# Patient Record
Sex: Male | Born: 1975 | Race: White | Hispanic: No | Smoking: Former smoker
Health system: Southern US, Community
[De-identification: ages and names within clinical notes are randomized; demographics above are authoritative.]

## PROBLEM LIST (undated history)

## (undated) DIAGNOSIS — D649 Anemia, unspecified: Secondary | ICD-10-CM

## (undated) DIAGNOSIS — R011 Cardiac murmur, unspecified: Secondary | ICD-10-CM

## (undated) DIAGNOSIS — G4730 Sleep apnea, unspecified: Secondary | ICD-10-CM

## (undated) DIAGNOSIS — I509 Heart failure, unspecified: Secondary | ICD-10-CM

## (undated) DIAGNOSIS — J449 Chronic obstructive pulmonary disease, unspecified: Secondary | ICD-10-CM

## (undated) DIAGNOSIS — T7840XA Allergy, unspecified, initial encounter: Secondary | ICD-10-CM

## (undated) DIAGNOSIS — F1910 Other psychoactive substance abuse, uncomplicated: Secondary | ICD-10-CM

## (undated) DIAGNOSIS — K219 Gastro-esophageal reflux disease without esophagitis: Secondary | ICD-10-CM

## (undated) DIAGNOSIS — J45909 Unspecified asthma, uncomplicated: Secondary | ICD-10-CM

## (undated) DIAGNOSIS — F32A Depression, unspecified: Secondary | ICD-10-CM

## (undated) HISTORY — DX: Sleep apnea, unspecified: G47.30

## (undated) HISTORY — DX: Heart failure, unspecified: I50.9

## (undated) HISTORY — DX: Allergy, unspecified, initial encounter: T78.40XA

## (undated) HISTORY — DX: Other psychoactive substance abuse, uncomplicated: F19.10

## (undated) HISTORY — DX: Depression, unspecified: F32.A

## (undated) HISTORY — DX: Cardiac murmur, unspecified: R01.1

## (undated) HISTORY — DX: Chronic obstructive pulmonary disease, unspecified: J44.9

## (undated) HISTORY — PX: CARDIAC CATHETERIZATION: SHX172

## (undated) HISTORY — PX: NO PAST SURGERIES: SHX2092

## (undated) HISTORY — DX: Gastro-esophageal reflux disease without esophagitis: K21.9

## (undated) HISTORY — DX: Unspecified asthma, uncomplicated: J45.909

## (undated) HISTORY — DX: Anemia, unspecified: D64.9

---

## 2020-05-28 DIAGNOSIS — U071 COVID-19: Secondary | ICD-10-CM

## 2020-05-28 DIAGNOSIS — R112 Nausea with vomiting, unspecified: Secondary | ICD-10-CM

## 2020-05-28 DIAGNOSIS — R079 Chest pain, unspecified: Secondary | ICD-10-CM

## 2020-05-28 DIAGNOSIS — I5023 Acute on chronic systolic (congestive) heart failure: Secondary | ICD-10-CM

## 2020-05-28 DIAGNOSIS — J449 Chronic obstructive pulmonary disease, unspecified: Secondary | ICD-10-CM | POA: Insufficient documentation

## 2020-05-28 DIAGNOSIS — I447 Left bundle-branch block, unspecified: Secondary | ICD-10-CM

## 2020-05-28 DIAGNOSIS — J1282 Pneumonia due to coronavirus disease 2019: Secondary | ICD-10-CM | POA: Insufficient documentation

## 2020-05-28 DIAGNOSIS — F411 Generalized anxiety disorder: Secondary | ICD-10-CM | POA: Insufficient documentation

## 2020-05-28 DIAGNOSIS — I10 Essential (primary) hypertension: Secondary | ICD-10-CM | POA: Insufficient documentation

## 2020-05-28 HISTORY — DX: Acute on chronic systolic (congestive) heart failure: I50.23

## 2020-05-28 HISTORY — DX: Chest pain, unspecified: R07.9

## 2020-05-28 HISTORY — DX: Essential (primary) hypertension: I10

## 2020-05-28 HISTORY — DX: Pneumonia due to coronavirus disease 2019: J12.82

## 2020-05-28 HISTORY — DX: Left bundle-branch block, unspecified: I44.7

## 2020-05-28 HISTORY — DX: Generalized anxiety disorder: F41.1

## 2020-05-28 HISTORY — DX: COVID-19: U07.1

## 2020-05-28 HISTORY — DX: Nausea with vomiting, unspecified: R11.2

## 2020-07-21 ENCOUNTER — Other Ambulatory Visit: Payer: Self-pay

## 2020-07-21 ENCOUNTER — Encounter: Payer: Self-pay | Admitting: Emergency Medicine

## 2020-07-21 ENCOUNTER — Encounter: Payer: Self-pay | Admitting: Cardiology

## 2020-07-21 ENCOUNTER — Ambulatory Visit (INDEPENDENT_AMBULATORY_CARE_PROVIDER_SITE_OTHER): Payer: Self-pay | Admitting: Cardiology

## 2020-07-21 DIAGNOSIS — I509 Heart failure, unspecified: Secondary | ICD-10-CM | POA: Insufficient documentation

## 2020-07-21 DIAGNOSIS — I5042 Chronic combined systolic (congestive) and diastolic (congestive) heart failure: Secondary | ICD-10-CM

## 2020-07-21 DIAGNOSIS — I420 Dilated cardiomyopathy: Secondary | ICD-10-CM | POA: Insufficient documentation

## 2020-07-21 DIAGNOSIS — R0600 Dyspnea, unspecified: Secondary | ICD-10-CM

## 2020-07-21 DIAGNOSIS — G4733 Obstructive sleep apnea (adult) (pediatric): Secondary | ICD-10-CM

## 2020-07-21 DIAGNOSIS — R0609 Other forms of dyspnea: Secondary | ICD-10-CM | POA: Insufficient documentation

## 2020-07-21 HISTORY — DX: Other forms of dyspnea: R06.09

## 2020-07-21 HISTORY — DX: Dyspnea, unspecified: R06.00

## 2020-07-21 HISTORY — DX: Heart failure, unspecified: I50.9

## 2020-07-21 HISTORY — DX: Dilated cardiomyopathy: I42.0

## 2020-07-21 HISTORY — DX: Obstructive sleep apnea (adult) (pediatric): G47.33

## 2020-07-21 NOTE — Patient Instructions (Addendum)
Medication Instructions:  Your physician recommends that you continue on your current medications as directed. Please refer to the Current Medication list given to you today.   *If you need a refill on your cardiac medications before your next appointment, please call your pharmacy*   Lab Work: You will have labs at [facility] clinic   If you have labs (blood work) drawn today and your tests are completely normal, you will receive your results only by: Marland Kitchen MyChart Message (if you have MyChart) OR . A paper copy in the mail If you have any lab test that is abnormal or we need to change your treatment, we will call you to review the results.   Testing/Procedures: None   Follow-Up: At [facility], you and your health needs are our priority.  As part of our continuing mission to provide you with exceptional heart care, we have created designated Provider Care Teams.  These Care Teams include your primary Cardiologist (physician) and Advanced Practice Providers (APPs -  Physician Assistants and Nurse Practitioners) who all work together to provide you with the care you need, when you need it.  We recommend signing up for the patient portal called "MyChart".  Sign up information is provided on this After Visit Summary.  MyChart is used to connect with patients for Virtual Visits (Telemedicine).  Patients are able to view lab/test results, encounter notes, upcoming appointments, etc.  Non-urgent messages can be sent to your provider as well.   To learn more about what you can do with MyChart, go to [email or web address].    Your next appointment:   2 week(s)  The format for your next appointment:   In Person  Provider:   Gypsy Balsam, MD   Other Instructions  [email or web address]

## 2020-07-21 NOTE — Progress Notes (Signed)
Cardiology Consultation:    Date:  07/21/2020   ID:  Cameron Jenkins, DOB 01/19/1976, MRN [id number]  PCP:  [facility]  Cardiologist:  Gypsy Balsam, MD   Referring MD: [facility], [facility]   Chief Complaint  Patient presents with  . Congestive Heart Failure  I need to be established with cardiologist  History of Present Illness:    Cameron Jenkins is a 44 y.o. male who is being seen today for the evaluation of congestive heart failure at the request of [facility].  Recently he relocated to [place].  In February he was admitted in the hospital in [place] because of congestive heart failure.  He was found to have ejection fraction 10%.  I do not have records from that admission.  He was treated appropriately there was some conversation about LVAD and eventually he left hospital with [facility].  However he also lost his insurance and that he fell off from following up.  About a month ago he ended up going to [place] because of some shortness of breath he eventually end up signing out AGAINST MEDICAL ADVICE however he was find to have Covid.  He comes today to the office he would like to be establish in cardiac clinic.  He still described to have some shortness of breath fatigue tiredness as well as swelling of lower extremities denies having a palpitation, syncope.  Past Medical History:  Diagnosis Date  . COPD (chronic obstructive pulmonary disease) (HCC)   . Heart failure (HCC)   . 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 NO PAST SURGERIES      Current Medications: Current Meds  Medication Sig  . albuterol (PROVENTIL) (5 MG/ML) 0.5% nebulizer solution Take 2.5 mg by nebulization every 6 (six) hours as needed for wheezing or shortness of breath.  . budesonide-formoterol (SYMBICORT) 160-4.5 MCG/ACT inhaler Inhale 2 puffs into the lungs 2 (two) times daily.  . bumetanide  (BUMEX) 1 MG tablet Take 1 mg by mouth daily.  . carvedilol (COREG) 3.125 MG tablet Take 3.125 mg by mouth 2 (two) times daily with a meal. For 30 days  . Coenzyme Q10 (CO Q 10) 100 MG CAPS Take by mouth daily.  . valsartan (DIOVAN) 40 MG tablet Take 20 mg by mouth 2 (two) times daily.     Allergies:   Acetaminophen   Social History   Socioeconomic History  . Marital status: Unknown    Spouse name: Not on file  . Number of children: Not on file  . Years of education: Not on file  . Highest education level: Not on file  Occupational History  . Not on file  Tobacco Use  . Smoking status: Former Smoker    Types: Cigarettes    Quit date: 07/21/2013    Years since quitting: 7.0  . Smokeless tobacco: Never Used  Substance and Sexual Activity  . Alcohol use: Never  . Drug use: Not Currently    Types: Marijuana  . Sexual activity: Not on file  Other Topics Concern  . Not on file  Social History Narrative  . Not on file   Social Determinants of Health   Financial Resource Strain:   . Difficulty of Paying Living Expenses: Not on file  Food Insecurity:   . Worried About Programme researcher, broadcasting/film/video in the Last Year: Not on file  . Ran Out of Food in  the Last Year: Not on file  Transportation Needs:   . Lack of Transportation (Medical): Not on file  . Lack of Transportation (Non-Medical): Not on file  Physical Activity:   . Days of Exercise per Week: Not on file  . Minutes of Exercise per Session: Not on file  Stress:   . Feeling of Stress : Not on file  Social Connections:   . Frequency of Communication with Friends and Family: Not on file  . Frequency of Social Gatherings with Friends and Family: Not on file  . Attends Religious Services: Not on file  . Active Member of Clubs or Organizations: Not on file  . Attends Banker Meetings: Not on file  . Marital Status: Not on file     Family History: The patient's family history includes Bone cancer in his maternal  grandmother; COPD in his mother; Heart attack in his father; Heart disease in his mother; Stomach cancer in his maternal grandfather; Stroke in his father. ROS:   Please see the history of present illness.    All 14 point review of systems negative except as described per history of present illness.  EKGs/Labs/Other Studies Reviewed:    The following studies were reviewed today: I did have a quick look with ultrasound today which showed ejection fraction less than 20%  EKG:  EKG is  ordered today.  The ekg ordered today demonstrates sinus rhythm, normal P interval, left bundle branch block  Recent Labs: No results found for requested labs within last 8760 hours.  Recent Lipid Panel No results found for: CHOL, TRIG, HDL, CHOLHDL, VLDL, LDLCALC, LDLDIRECT  Physical Exam:    VS:  BP 110/84 (BP Location: Right Arm, Patient Position: Sitting, Cuff Size: Normal)   Pulse 82   Ht 6\' 1"  (1.854 m)   Wt 232 lb (105.2 kg)   SpO2 96%   BMI 30.61 kg/m     Wt Readings from Last 3 Encounters:  07/21/20 232 lb (105.2 kg)  06/22/20 230 lb (104.3 kg)     GEN:  Well nourished, well developed in no acute distress HEENT: Normal NECK: No JVD; No carotid bruits LYMPHATICS: No lymphadenopathy CARDIAC: RRR, no murmurs, no rubs, no gallops RESPIRATORY:  Clear to auscultation without rales, wheezing or rhonchi  ABDOMEN: Soft, non-tender, non-distended MUSCULOSKELETAL:  No edema; No deformity  SKIN: Warm and dry NEUROLOGIC:  Alert and oriented x 3 PSYCHIATRIC:  Normal affect   ASSESSMENT:    1. Chronic combined systolic and diastolic congestive heart failure (HCC)   2. Dilated cardiomyopathy (HCC)   3. Dyspnea on exertion   4. Obstructive sleep apnea    PLAN:    In order of problems listed above:  1. Cardiomyopathy which appears to be dilated ejection fraction still less than 20 he is on Diovan Bumex Coreg, he hates needles I try to do Chem-7 on him today he refused.  However he did have  blood test done this week in the primary care physician office will call his office to get a copy of it.  I would like to increase dose of Diovan if he can also I would like to add Aldactone to his medical regimen and go up with beta-blocker.  Will request copy of his records from 08/22/20.  I would like to know if there was evaluation for coronary artery disease being done.  He said he had a stress test but we need to get a copy of it.  He need to be  to put on appropriate medical therapy and then if there is no improvement consideration should be given to BiV pacing with ICD. 2. Dyspnea on exertion related to congestive heart failure. 3. Congestive heart for which is systolic and diastolic he is New York Heart Association class III. 4. Obstructive sleep apnea.  He does not have any insurance he cannot afford CPAP mask.  And he does not want to use it.   Overall he is a very sick individual with severe cardiomyopathy.  We will try to help him the best we can.  Obstacle I see his lack of insurance.   Medication Adjustments/Labs and Tests Ordered: Current medicines are reviewed at length with the patient today.  Concerns regarding medicines are outlined above.  No orders of the defined types were placed in this encounter.  No orders of the defined types were placed in this encounter.   Signed, Georgeanna Lea, MD, [facility]. 07/21/2020 3:28 PM    [facility]

## 2020-08-03 DIAGNOSIS — I509 Heart failure, unspecified: Secondary | ICD-10-CM | POA: Insufficient documentation

## 2020-08-04 ENCOUNTER — Encounter: Payer: Self-pay | Admitting: Cardiology

## 2020-08-04 ENCOUNTER — Ambulatory Visit (INDEPENDENT_AMBULATORY_CARE_PROVIDER_SITE_OTHER): Payer: Self-pay | Admitting: Cardiology

## 2020-08-04 ENCOUNTER — Other Ambulatory Visit: Payer: Self-pay

## 2020-08-04 VITALS — BP 110/84 | HR 82 | Ht 73.0 in | Wt 232.0 lb

## 2020-08-04 DIAGNOSIS — R0600 Dyspnea, unspecified: Secondary | ICD-10-CM

## 2020-08-04 DIAGNOSIS — I5042 Chronic combined systolic (congestive) and diastolic (congestive) heart failure: Secondary | ICD-10-CM

## 2020-08-04 DIAGNOSIS — R0609 Other forms of dyspnea: Secondary | ICD-10-CM

## 2020-08-04 DIAGNOSIS — I10 Essential (primary) hypertension: Secondary | ICD-10-CM

## 2020-08-04 DIAGNOSIS — I420 Dilated cardiomyopathy: Secondary | ICD-10-CM

## 2020-08-04 DIAGNOSIS — I447 Left bundle-branch block, unspecified: Secondary | ICD-10-CM

## 2020-08-04 MED ORDER — CARVEDILOL 6.25 MG PO TABS: 6.2500 mg | ORAL_TABLET | Freq: Two times a day (BID) | ORAL | 3 refills | Status: DC

## 2020-08-04 MED ORDER — BUMETANIDE 1 MG PO TABS
1.0000 mg | ORAL_TABLET | Freq: Every day | ORAL | 3 refills | Status: DC
Start: 2020-08-04 — End: 2021-02-23

## 2020-08-04 MED ORDER — VALSARTAN 40 MG PO TABS
40.0000 mg | ORAL_TABLET | Freq: Two times a day (BID) | ORAL | 3 refills | Status: DC
Start: 2020-08-04 — End: 2021-10-10

## 2020-08-04 NOTE — Patient Instructions (Signed)
Medication Instructions:  Increase Carvedilol (Coreg) to 6.25 mg twice daily   *If you need a refill on your cardiac medications before your next appointment, please call your pharmacy*   Lab Work: None ordered   If you have labs (blood work) drawn today and your tests are completely normal, you will receive your results only by: Marland Kitchen MyChart Message (if you have MyChart) OR . A paper copy in the mail If you have any lab test that is abnormal or we need to change your treatment, we will call you to review the results.   Testing/Procedures: None ordered    Follow-Up: At [facility], you and your health needs are our priority.  As part of our continuing mission to provide you with exceptional heart care, we have created designated Provider Care Teams.  These Care Teams include your primary Cardiologist (physician) and Advanced Practice Providers (APPs -  Physician Assistants and Nurse Practitioners) who all work together to provide you with the care you need, when you need it.  We recommend signing up for the patient portal called "MyChart".  Sign up information is provided on this After Visit Summary.  MyChart is used to connect with patients for Virtual Visits (Telemedicine).  Patients are able to view lab/test results, encounter notes, upcoming appointments, etc.  Non-urgent messages can be sent to your provider as well.   To learn more about what you can do with MyChart, go to [email or web address].    Your next appointment:   3 week(s)  The format for your next appointment:   In Person  Provider:   Gypsy Balsam, MD   Other Instructions None

## 2020-08-04 NOTE — Progress Notes (Signed)
Cardiology Office Note:    Date:  08/04/2020   ID:  Cameron Jenkins, DOB 1975-12-23, MRN [id number]  PCP:  [facility]  Cardiologist:  Cameron Balsam, MD    Referring MD: [facility]   Chief Complaint  Patient presents with  . Follow-up  I am doing better  History of Present Illness:    Cameron Jenkins is a 44 y.o. male with past medical history significant for cardiomyopathy with severely reduced left ventricle ejection fraction 10 to 20%, initially he was admitted in [place] he spent long time in the [facility] there.  There was also some conversation about LVAD however he never got it that he lost his insurance relocated [place] and he went to [facility] clinic in our town and then he was referred to [place].  In the meantime he also ended up in emergency room in [place] because of difficulty breathing but signed AGAINST MEDICAL ADVICE.  When I did see him he was in decompensated congestive heart failure.  We restarted all medications, he did not allow [place] to do blood test on him.  We requested copy of his Chem-7 from [facility] clinic but so far did not get it. He comes today 2 months for follow-up.  He said he is doing better swelling of lower extremities still there but not much shortness of breath there is no proximal nocturnal dyspnea no dizziness no passing out.  He denies have any chest pain and again he said overall he feels better with medication that he is on  Past Medical History:  Diagnosis Date  . Acute on chronic systolic congestive heart failure (HCC) 05/28/2020   Formatting of this note might be different from the original. IMO update 2021  . Chest pain 05/28/2020  . Congestive heart failure (CHF) (HCC) 07/21/2020  . COPD (chronic obstructive pulmonary disease) (HCC)   . Dilated cardiomyopathy (HCC) 07/21/2020  . Dyspnea on exertion 07/21/2020  . Essential hypertension 05/28/2020  . Generalized anxiety disorder 05/28/2020  . Heart failure (HCC)   . LBBB  (left bundle branch block) 05/28/2020  . Nausea and vomiting 05/28/2020  . Obstructive sleep apnea 07/21/2020  . Pneumonia due to COVID-19 virus 05/28/2020   Formatting of this note might be different from the original. Diagnosed by SARS-CoV-2 PCR Cephid test at [facility] in [place], [place] on 05/28/2020.  . 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 NO PAST SURGERIES      Current Medications: Current Meds  Medication Sig  . albuterol (PROVENTIL) (5 MG/ML) 0.5% nebulizer solution Take 2.5 mg by nebulization every 6 (six) hours as needed for wheezing or shortness of breath.  . budesonide-formoterol (SYMBICORT) 160-4.5 MCG/ACT inhaler Inhale 2 puffs into the lungs 2 (two) times daily.  . bumetanide (BUMEX) 1 MG tablet Take 1 tablet (1 mg total) by mouth daily.  . carvedilol (COREG) 6.25 MG tablet Take 1 tablet (6.25 mg total) by mouth 2 (two) times daily with a meal.  . Coenzyme Q10 (CO Q 10) 100 MG CAPS Take by mouth daily.  . valsartan (DIOVAN) 40 MG tablet Take 1 tablet (40 mg total) by mouth 2 (two) times daily.  . [DISCONTINUED] bumetanide (BUMEX) 1 MG tablet Take 1 mg by mouth daily.  . [DISCONTINUED] carvedilol (COREG) 3.125 MG tablet Take 3.125 mg by mouth 2 (two) times daily with a meal. For 30 days  . [DISCONTINUED] valsartan (  DIOVAN) 40 MG tablet Take 20 mg by mouth 2 (two) times daily.     Allergies:   Acetaminophen   Social History   Socioeconomic History  . Marital status: Unknown    Spouse name: Not on file  . Number of children: Not on file  . Years of education: Not on file  . Highest education level: Not on file  Occupational History  . Not on file  Tobacco Use  . Smoking status: Former Smoker    Types: Cigarettes    Quit date: 07/21/2013    Years since quitting: 7.0  . Smokeless tobacco: Never Used  Substance and Sexual Activity  . Alcohol use: Never  . Drug use: Not Currently     Types: Marijuana  . Sexual activity: Not on file  Other Topics Concern  . Not on file  Social History Narrative  . Not on file   Social Determinants of Health   Financial Resource Strain:   . Difficulty of Paying Living Expenses: Not on file  Food Insecurity:   . Worried About Programme researcher, broadcasting/film/video in the Last Year: Not on file  . Ran Out of Food in the Last Year: Not on file  Transportation Needs:   . Lack of Transportation (Medical): Not on file  . Lack of Transportation (Non-Medical): Not on file  Physical Activity:   . Days of Exercise per Week: Not on file  . Minutes of Exercise per Session: Not on file  Stress:   . Feeling of Stress : Not on file  Social Connections:   . Frequency of Communication with Friends and Family: Not on file  . Frequency of Social Gatherings with Friends and Family: Not on file  . Attends Religious Services: Not on file  . Active Member of Clubs or Organizations: Not on file  . Attends Banker Meetings: Not on file  . Marital Status: Not on file     Family History: The patient's family history includes Bone cancer in his maternal grandmother; COPD in his mother; Heart attack in his father; Heart disease in his mother; Stomach cancer in his maternal grandfather; Stroke in his father. ROS:   Please see the history of present illness.    All 14 point review of systems negative except as described per history of present illness  EKGs/Labs/Other Studies Reviewed:      Recent Labs: No results found for requested labs within last 8760 hours.  Recent Lipid Panel No results found for: CHOL, TRIG, HDL, CHOLHDL, VLDL, LDLCALC, LDLDIRECT  Physical Exam:    VS:  BP 110/84 (BP Location: Right Arm, Patient Position: Sitting, Cuff Size: Normal)   Pulse 82   Ht 6\' 1"  (1.854 m)   Wt 232 lb (105.2 kg)   SpO2 96%   BMI 30.61 kg/m     Wt Readings from Last 3 Encounters:  08/04/20 232 lb (105.2 kg)  07/21/20 232 lb (105.2 kg)  06/22/20  230 lb (104.3 kg)     GEN:  Well nourished, well developed in no acute distress HEENT: Normal NECK: No JVD; No carotid bruits LYMPHATICS: No lymphadenopathy CARDIAC: RRR, no murmurs, no rubs, no gallops RESPIRATORY:  Clear to auscultation without rales, wheezing or rhonchi  ABDOMEN: Soft, non-tender, non-distended MUSCULOSKELETAL:  No edema; No deformity  SKIN: Warm and dry LOWER EXTREMITIES: no swelling NEUROLOGIC:  Alert and oriented x 3 PSYCHIATRIC:  Normal affect   ASSESSMENT:    1. Dilated cardiomyopathy (HCC)   2. Chronic  combined systolic and diastolic congestive heart failure (HCC)   3. Dyspnea on exertion   4. LBBB (left bundle branch block)   5. Essential hypertension    PLAN:    In order of problems listed above:  1. Dilated cardiomyopathy gradual return to putting on the right medications.  He is taking carvedilol 3.125 twice daily I will increase dose today to 6.25 twice a day.  I wanted him to have Chem-7 done to see if I can increase dose of valsartan or maybe even switch him to [facility], however, he is very scared of needles and he does not want to have it done.  He said just last Monday he had blood test check at [facility].  We will contact them on Monday and try to get a copy of it and based on that decide what to do next.  The key right now will be to put him on appropriate medical therapy and see if there is any improvement left ventricle ejection fraction.  If not then need to consider BiV pacing with defibrillator. 2. Dyspnea exertion improved with current medications.  On the physical exam only mild decompensation which is manifested by swelling of lower extremities. 3. Left bundle branch block, noted, he can benefit from BiV pacing.  However there are multiple obstacles including lack of insurance.  He apparently applied for Medicaid and he expect to get it and a day. 4. Essential hypertension is not a problem right now in control his blood pressure slightly  on the lower side.  Overall he is a very sick gentleman.  I still did not get records from [place] will request again.  See him back in 3 weeks   Medication Adjustments/Labs and Tests Ordered: Current medicines are reviewed at length with the patient today.  Concerns regarding medicines are outlined above.  No orders of the defined types were placed in this encounter.  Medication changes:  Meds ordered this encounter  Medications  . valsartan (DIOVAN) 40 MG tablet    Sig: Take 1 tablet (40 mg total) by mouth 2 (two) times daily.    Dispense:  180 tablet    Refill:  3  . bumetanide (BUMEX) 1 MG tablet    Sig: Take 1 tablet (1 mg total) by mouth daily.    Dispense:  90 tablet    Refill:  3  . carvedilol (COREG) 6.25 MG tablet    Sig: Take 1 tablet (6.25 mg total) by mouth 2 (two) times daily with a meal.    Dispense:  180 tablet    Refill:  3    Signed, Georgeanna Lea, MD, [facility] 08/04/2020 3:40 PM    [facility]

## 2020-08-25 ENCOUNTER — Ambulatory Visit: Payer: Self-pay | Admitting: Cardiology

## 2020-12-15 ENCOUNTER — Ambulatory Visit: Payer: Medicaid Other | Admitting: Cardiology

## 2021-02-23 ENCOUNTER — Ambulatory Visit: Payer: Medicaid Other | Admitting: Cardiology

## 2021-02-23 ENCOUNTER — Encounter: Payer: Self-pay | Admitting: Cardiology

## 2021-02-23 ENCOUNTER — Other Ambulatory Visit: Payer: Self-pay

## 2021-02-23 VITALS — BP 126/74 | HR 113 | Ht 73.0 in | Wt 244.0 lb

## 2021-02-23 DIAGNOSIS — I10 Essential (primary) hypertension: Secondary | ICD-10-CM | POA: Diagnosis not present

## 2021-02-23 DIAGNOSIS — I447 Left bundle-branch block, unspecified: Secondary | ICD-10-CM | POA: Diagnosis not present

## 2021-02-23 DIAGNOSIS — I420 Dilated cardiomyopathy: Secondary | ICD-10-CM

## 2021-02-23 DIAGNOSIS — F411 Generalized anxiety disorder: Secondary | ICD-10-CM

## 2021-02-23 MED ORDER — POTASSIUM CHLORIDE ER 10 MEQ PO TBCR: 10.0000 meq | EXTENDED_RELEASE_TABLET | Freq: Every day | ORAL | 1 refills | Status: DC

## 2021-02-23 MED ORDER — ALPRAZOLAM 0.25 MG PO TABS: ORAL_TABLET | ORAL | 0 refills | Status: DC

## 2021-02-23 MED ORDER — FUROSEMIDE 40 MG PO TABS: 40.0000 mg | ORAL_TABLET | Freq: Every day | ORAL | 1 refills | Status: DC

## 2021-02-23 NOTE — Patient Instructions (Signed)
Medication Instructions:  Your physician has recommended you make the following change in your medication: Stop Bumex, start Furosemide 40mg  once and day and Potassium CL 10 meq once daily. *If you need a refill on your cardiac medications before your next appointment, please call your pharmacy*   Lab Work: Your physician recommends that you return for lab work in: BMP. Please take xanax 0.25 1/2 tab 1 hour prior to your blood work appt.  If you have labs (blood work) drawn today and your tests are completely normal, you will receive your results only by: MyChart Message (if you have MyChart) OR A paper copy in the mail If you have any lab test that is abnormal or we need to change your treatment, we will call you to review the results.   Testing/Procedures:    Follow-Up: At [facility], you and your health needs are our priority.  As part of our continuing mission to provide you with exceptional heart care, we have created designated Provider Care Teams.  These Care Teams include your primary Cardiologist (physician) and Advanced Practice Providers (APPs -  Physician Assistants and Nurse Practitioners) who all work together to provide you with the care you need, when you need it.  We recommend signing up for the patient portal called "MyChart".  Sign up information is provided on this After Visit Summary.  MyChart is used to connect with patients for Virtual Visits (Telemedicine).  Patients are able to view lab/test results, encounter notes, upcoming appointments, etc.  Non-urgent messages can be sent to your provider as well.   To learn more about what you can do with MyChart, go to [facility].    Your next appointment:   3 month(s)  The format for your next appointment:   In Person  Provider:   [email or web address], MD   Other Instructions

## 2021-02-23 NOTE — Addendum Note (Signed)
Addended by: Heywood Bene on: 02/23/2021 11:26 AM   Modules accepted: Orders

## 2021-02-23 NOTE — Progress Notes (Signed)
Cardiology Office Note:    Date:  02/23/2021   ID:  Cameron Jenkins, DOB 23-Dec-1975, MRN [id number]  PCP:  [facility]  Cardiologist:  Gypsy Balsam, MD    Referring MD: [facility], [facility]   No chief complaint on file. Doing better  History of Present Illness:    Cameron Jenkins is a 45 y.o. male with quite incredible story he was diagnosed with severe cardiomyopathy ejection fraction 10 to 20% that was initially diagnosed while he was in [place].  He was admitted to the hospital and spent some time there there was some discussion about potentially putting LVAD into him however since he did not have insurance and did not happen eventually he ended up moving to [place] and up going to [place] because of difficulty breathing and then he signed out AGAINST MEDICAL ADVICE.  I have seen him so far twice twice in November we started putting on appropriate medication then he simply disappeared from follow-up he showed up today.  He said that the reason why he did not follow-up is the fact that he did not have a car.  Now he does have it he still described to have some shortness of breath but is no significant swelling of lower extremities there is no chest pain tightness squeezing pressure burning chest  Past Medical History:  Diagnosis Date   Acute on chronic systolic congestive heart failure (HCC) 05/28/2020   Formatting of this note might be different from the original. IMO update 2021   Chest pain 05/28/2020   Congestive heart failure (CHF) (HCC) 07/21/2020   COPD (chronic obstructive pulmonary disease) (HCC)    Dilated cardiomyopathy (HCC) 07/21/2020   Dyspnea on exertion 07/21/2020   Essential hypertension 05/28/2020   Generalized anxiety disorder 05/28/2020   Heart failure (HCC)    LBBB (left bundle branch block) 05/28/2020   Nausea and vomiting 05/28/2020   Obstructive sleep apnea 07/21/2020   Pneumonia due to COVID-19 virus 05/28/2020   Formatting of this note  might be different from the original. Diagnosed by SARS-CoV-2 PCR Cephid test at [facility] in [place], [place] on 05/28/2020.   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   budesonide-formoterol (SYMBICORT) 160-4.5 MCG/ACT inhaler Inhale 2 puffs into the lungs 2 (two) times daily.   bumetanide (BUMEX) 1 MG tablet Take 1 tablet (1 mg total) by mouth daily.   carvedilol (COREG) 6.25 MG tablet Take 1 tablet (6.25 mg total) by mouth 2 (two) times daily with a meal.   valsartan (DIOVAN) 40 MG tablet Take 1 tablet (40 mg total) by mouth 2 (two) times daily.     Allergies:   Acetaminophen   Social History   Socioeconomic History   Marital status: Unknown    Spouse name: Not on file   Number of children: Not on file   Years of education: Not on file   Highest education level: Not on file  Occupational History   Not on file  Tobacco Use   Smoking status: Former    Pack years: 0.00    Types: Cigarettes    Quit date: 07/21/2013    Years since quitting: 7.6  Smokeless tobacco: Never  Substance and Sexual Activity   Alcohol use: Never   Drug use: Not Currently    Types: Marijuana   Sexual activity: Not on file  Other Topics Concern   Not on file  Social History Narrative   Not on file   Social Determinants of Health   Financial Resource Strain: Not on file  Food Insecurity: Not on file  Transportation Needs: Not on file  Physical Activity: Not on file  Stress: Not on file  Social Connections: Not on file     Family History: The patient's family history includes Bone cancer in his maternal grandmother; COPD in his mother; Heart attack in his father; Heart disease in his  mother; Stomach cancer in his maternal grandfather; Stroke in his father. ROS:   Please see the history of present illness.    All 14 point review of systems negative except as described per history of present illness  EKGs/Labs/Other Studies Reviewed:      Recent Labs: No results found for requested labs within last 8760 hours.  Recent Lipid Panel No results found for: CHOL, TRIG, HDL, CHOLHDL, VLDL, LDLCALC, LDLDIRECT  Physical Exam:    VS:  BP 126/74 (BP Location: Left Arm, Patient Position: Sitting)   Pulse (!) 113   Ht 6\' 1"  (1.854 m)   Wt 244 lb (110.7 kg)   SpO2 94%   BMI 32.19 kg/m     Wt Readings from Last 3 Encounters:  02/23/21 244 lb (110.7 kg)  08/04/20 232 lb (105.2 kg)  07/21/20 232 lb (105.2 kg)     GEN:  Well nourished, well developed in no acute distress HEENT: Normal NECK: No JVD; No carotid bruits LYMPHATICS: No lymphadenopathy CARDIAC: RRR, no murmurs, no rubs, no gallops RESPIRATORY:  Clear to auscultation without rales, wheezing or rhonchi  ABDOMEN: Soft, non-tender, non-distended MUSCULOSKELETAL:  No edema; No deformity  SKIN: Warm and dry LOWER EXTREMITIES: no swelling NEUROLOGIC:  Alert and oriented x 3 PSYCHIATRIC:  Normal affect   ASSESSMENT:    1. Dilated cardiomyopathy (HCC)   2. LBBB (left bundle branch block)   3. Essential hypertension   4. Generalized anxiety disorder    PLAN:    In order of problems listed above:  Dilated cardiomyopathy I do know what his ejection fraction is right now.  He is a little overloaded with fluid I will give him furosemide 40 with potassium 10 we will check his Chem-7 next week.  He does have anxiety and he asked me to give him prescription for alprazolam before he comes to have his blood work which I will do.  He is taking carvedilol which I will continue also Diovan but again before making any changes in his management I need to know what his kidney function is.  Hemodynamically looks only minimally  decompensated. Essential hypertension, blood pressure appears to be well controlled we will continue present management. General anxiety disorder.  We will give him Xanax before visit for blood work. Noncompliance sadly the issue is his financial resources which are limited.  He did not have Cardon's why he did not come to follow-up visit.  Now he promised to follow-up on the regular basis. History of smoking he quit 7 years ago and he is still abstaining from smoking.   Medication Adjustments/Labs and Tests Ordered: Current medicines are reviewed at length with the patient today.  Concerns regarding medicines are outlined above.  No orders of the defined types were placed in this  encounter.  Medication changes: No orders of the defined types were placed in this encounter.   Signed, Georgeanna Lea, MD, [facility] 02/23/2021 11:11 AM    [facility]

## 2021-02-23 NOTE — Addendum Note (Signed)
Addended by: Heywood Bene on: 02/23/2021 11:53 AM   Modules accepted: Orders

## 2021-04-10 ENCOUNTER — Other Ambulatory Visit: Payer: Self-pay

## 2021-04-12 ENCOUNTER — Telehealth: Payer: Self-pay | Admitting: Cardiology

## 2021-04-12 ENCOUNTER — Ambulatory Visit: Payer: Medicaid Other | Admitting: Cardiology

## 2021-04-12 DIAGNOSIS — R0602 Shortness of breath: Secondary | ICD-10-CM

## 2021-04-12 NOTE — Telephone Encounter (Signed)
Pt c/o medication issue: 1. Name of Medication: furosemide  2. How are you currently taking this medication (dosage and times per day)? Once a day 3. Are you having a reaction (difficulty breathing--STAT)?  At night  4. What is your medication issue? Patient need to speak with some one concering this medication dosage

## 2021-04-12 NOTE — Addendum Note (Signed)
Addended by: Eleonore Chiquito on: 04/12/2021 01:01 PM   Modules accepted: Orders

## 2021-04-12 NOTE — Telephone Encounter (Signed)
Pt states that he has shortness of breath more in the mornings and late in the evening. Pt states that this has been going on for 2 weeks and he planned to discuss in the office but was unable to keep his appointment due to a fever. Pt was wondering if his fluid medication could be adjusted to morning and evening. How do you advise?

## 2021-04-12 NOTE — Telephone Encounter (Signed)
Recommendations reviewed with pt as per Dr. Vanetta Shawl note.  Pt states that he needs nerve medication prior to having labs done. Advised pt that we can lay him down but we do not prescribe medication for a blood draw. Pt also advised not to come in until after COVID test and fever free. Pt verbalized understanding and had no additional questions.

## 2021-06-08 DIAGNOSIS — F1190 Opioid use, unspecified, uncomplicated: Secondary | ICD-10-CM | POA: Insufficient documentation

## 2021-06-08 DIAGNOSIS — F1910 Other psychoactive substance abuse, uncomplicated: Secondary | ICD-10-CM | POA: Insufficient documentation

## 2021-06-08 DIAGNOSIS — Z5900 Homelessness unspecified: Secondary | ICD-10-CM | POA: Insufficient documentation

## 2021-06-09 DIAGNOSIS — F1120 Opioid dependence, uncomplicated: Secondary | ICD-10-CM | POA: Insufficient documentation

## 2021-08-02 ENCOUNTER — Ambulatory Visit: Payer: Medicaid Other | Admitting: Cardiology

## 2021-08-02 ENCOUNTER — Encounter: Payer: Self-pay | Admitting: Cardiology

## 2021-10-04 ENCOUNTER — Emergency Department (HOSPITAL_COMMUNITY): Payer: Medicaid Other

## 2021-10-04 ENCOUNTER — Other Ambulatory Visit: Payer: Self-pay

## 2021-10-04 ENCOUNTER — Encounter (HOSPITAL_COMMUNITY): Payer: Self-pay

## 2021-10-04 ENCOUNTER — Emergency Department (HOSPITAL_COMMUNITY)
Admission: EM | Admit: 2021-10-04 | Discharge: 2021-10-05 | Disposition: A | Discharge status: Home / Self Care | Payer: Medicaid Other | Source: Home / Self Care | Attending: Emergency Medicine | Admitting: Emergency Medicine

## 2021-10-04 DIAGNOSIS — Z20822 Contact with and (suspected) exposure to covid-19: Secondary | ICD-10-CM | POA: Insufficient documentation

## 2021-10-04 DIAGNOSIS — I5043 Acute on chronic combined systolic (congestive) and diastolic (congestive) heart failure: Secondary | ICD-10-CM | POA: Insufficient documentation

## 2021-10-04 DIAGNOSIS — Z79899 Other long term (current) drug therapy: Secondary | ICD-10-CM | POA: Insufficient documentation

## 2021-10-04 DIAGNOSIS — Z5321 Procedure and treatment not carried out due to patient leaving prior to being seen by health care provider: Secondary | ICD-10-CM | POA: Insufficient documentation

## 2021-10-04 DIAGNOSIS — I10 Essential (primary) hypertension: Secondary | ICD-10-CM | POA: Insufficient documentation

## 2021-10-04 DIAGNOSIS — I509 Heart failure, unspecified: Secondary | ICD-10-CM

## 2021-10-04 LAB — CBC
HCT: 49.8 % (ref 39.0–52.0)
Hemoglobin: 15.4 g/dL (ref 13.0–17.0)
MCH: 27.6 pg (ref 26.0–34.0)
MCHC: 30.9 g/dL (ref 30.0–36.0)
MCV: 89.2 fL (ref 80.0–100.0)
Platelets: 275 10*3/uL (ref 150–400)
RBC: 5.58 MIL/uL (ref 4.22–5.81)
RDW: 15.3 % (ref 11.5–15.5)
WBC: 6.9 10*3/uL (ref 4.0–10.5)
nRBC: 0 % (ref 0.0–0.2)

## 2021-10-04 LAB — RESP PANEL BY RT-PCR (FLU A&B, COVID) ARPGX2
Influenza A by PCR: NEGATIVE
Influenza B by PCR: NEGATIVE
SARS Coronavirus 2 by RT PCR: NEGATIVE

## 2021-10-04 LAB — BASIC METABOLIC PANEL
Anion gap: 5 (ref 5–15)
BUN: 15 mg/dL (ref 6–20)
CO2: 26 mmol/L (ref 22–32)
Calcium: 8.4 mg/dL — ABNORMAL LOW (ref 8.9–10.3)
Chloride: 106 mmol/L (ref 98–111)
Creatinine, Ser: 1.17 mg/dL (ref 0.61–1.24)
GFR, Estimated: >60 mL/min (ref >60)
Glucose, Bld: 121 mg/dL — ABNORMAL HIGH (ref 70–99)
Potassium: 4.6 mmol/L (ref 3.5–5.1)
Sodium: 137 mmol/L (ref 135–145)

## 2021-10-04 LAB — TROPONIN I (HIGH SENSITIVITY): Troponin I (High Sensitivity): 47 ng/L — ABNORMAL HIGH (ref <18)

## 2021-10-04 LAB — BRAIN NATRIURETIC PEPTIDE: B Natriuretic Peptide: 1326.2 pg/mL — ABNORMAL HIGH (ref 0.0–100.0)

## 2021-10-04 NOTE — ED Provider Triage Note (Signed)
Emergency Medicine Provider Triage Evaluation Note  Cameron Jenkins , a 46 y.o. male  was evaluated in triage.  Pt complains of chest pain and shortness of breath.  Was seen yesterday at the hospital, left before being seen.Marland Kitchen  History of CHF and hypertension, been out of his meds for 2 and half months.  70 pound weight gain.  Bilateral lower extremity swelling.  Review of Systems  Positive: Chest pain, shortness of breath, lower extremity swelling Negative: SYNCOPE  Physical Exam  BP (!) 149/104    Pulse (!) 109    Temp 97.7 F (36.5 C) (Oral)    Resp (!) 24    Ht 6\' 2"  (1.88 m)    Wt 118.4 kg    SpO2 98%    BMI 33.51 kg/m  Gen:   Awake, no distress   Resp:  Normal effort  MSK:   Moves extremities without difficulty  Other:  Pitting edema bilaterally, tachycardic and tachypneic.  Medical Decision Making  Medically screening exam initiated at 7:54 PM.  Appropriate orders placed.  was informed that the remainder of the evaluation will be completed by another provider, this initial triage assessment does not replace that evaluation, and the importance of remaining in the ED until their evaluation is complete.  LABS, DG CHEST. CHF exacerbation suspected. Trop yesterday 56.    [name], PA-C 10/04/21 1954

## 2021-10-04 NOTE — ED Triage Notes (Signed)
Pt presents to the ED, homeless with complaints of SOB, was seen at [facility] yesterday and after waiting multiple hours, left. Pt has hx of CHF and HTN, has been out of medication for 2.5 months. Productive cough with white sputum, congestion, swelling to bilateral lower legs.

## 2021-10-05 ENCOUNTER — Other Ambulatory Visit (HOSPITAL_COMMUNITY): Payer: Self-pay

## 2021-10-05 ENCOUNTER — Encounter (HOSPITAL_COMMUNITY): Payer: Self-pay

## 2021-10-05 ENCOUNTER — Observation Stay (HOSPITAL_COMMUNITY): Payer: Medicaid Other

## 2021-10-05 ENCOUNTER — Inpatient Hospital Stay (HOSPITAL_COMMUNITY)
Admission: EM | Admit: 2021-10-05 | Discharge: 2021-10-09 | DRG: 291 | Discharge status: Against Medical Advice | Payer: Medicaid Other | Attending: Internal Medicine | Admitting: Internal Medicine

## 2021-10-05 ENCOUNTER — Other Ambulatory Visit: Payer: Self-pay

## 2021-10-05 DIAGNOSIS — D751 Secondary polycythemia: Secondary | ICD-10-CM | POA: Diagnosis present

## 2021-10-05 DIAGNOSIS — G4733 Obstructive sleep apnea (adult) (pediatric): Secondary | ICD-10-CM | POA: Diagnosis present

## 2021-10-05 DIAGNOSIS — I10 Essential (primary) hypertension: Secondary | ICD-10-CM | POA: Diagnosis present

## 2021-10-05 DIAGNOSIS — Z7951 Long term (current) use of inhaled steroids: Secondary | ICD-10-CM | POA: Diagnosis not present

## 2021-10-05 DIAGNOSIS — Z5329 Procedure and treatment not carried out because of patient's decision for other reasons: Secondary | ICD-10-CM | POA: Diagnosis present

## 2021-10-05 DIAGNOSIS — I447 Left bundle-branch block, unspecified: Secondary | ICD-10-CM | POA: Diagnosis present

## 2021-10-05 DIAGNOSIS — I5023 Acute on chronic systolic (congestive) heart failure: Secondary | ICD-10-CM | POA: Diagnosis present

## 2021-10-05 DIAGNOSIS — F32A Depression, unspecified: Secondary | ICD-10-CM | POA: Diagnosis present

## 2021-10-05 DIAGNOSIS — Z5902 Unsheltered homelessness: Secondary | ICD-10-CM | POA: Diagnosis not present

## 2021-10-05 DIAGNOSIS — R112 Nausea with vomiting, unspecified: Secondary | ICD-10-CM | POA: Diagnosis not present

## 2021-10-05 DIAGNOSIS — F1910 Other psychoactive substance abuse, uncomplicated: Secondary | ICD-10-CM | POA: Diagnosis not present

## 2021-10-05 DIAGNOSIS — Z8674 Personal history of sudden cardiac arrest: Secondary | ICD-10-CM | POA: Diagnosis not present

## 2021-10-05 DIAGNOSIS — Z823 Family history of stroke: Secondary | ICD-10-CM

## 2021-10-05 DIAGNOSIS — I110 Hypertensive heart disease with heart failure: Principal | ICD-10-CM | POA: Diagnosis present

## 2021-10-05 DIAGNOSIS — E1165 Type 2 diabetes mellitus with hyperglycemia: Secondary | ICD-10-CM | POA: Diagnosis present

## 2021-10-05 DIAGNOSIS — Z5900 Homelessness unspecified: Secondary | ICD-10-CM

## 2021-10-05 DIAGNOSIS — Z79899 Other long term (current) drug therapy: Secondary | ICD-10-CM | POA: Diagnosis not present

## 2021-10-05 DIAGNOSIS — Z8249 Family history of ischemic heart disease and other diseases of the circulatory system: Secondary | ICD-10-CM

## 2021-10-05 DIAGNOSIS — F1510 Other stimulant abuse, uncomplicated: Secondary | ICD-10-CM | POA: Diagnosis present

## 2021-10-05 DIAGNOSIS — L03311 Cellulitis of abdominal wall: Secondary | ICD-10-CM | POA: Diagnosis present

## 2021-10-05 DIAGNOSIS — Z8616 Personal history of COVID-19: Secondary | ICD-10-CM | POA: Diagnosis not present

## 2021-10-05 DIAGNOSIS — J449 Chronic obstructive pulmonary disease, unspecified: Secondary | ICD-10-CM | POA: Diagnosis present

## 2021-10-05 DIAGNOSIS — Z66 Do not resuscitate: Secondary | ICD-10-CM | POA: Diagnosis present

## 2021-10-05 DIAGNOSIS — I509 Heart failure, unspecified: Secondary | ICD-10-CM

## 2021-10-05 DIAGNOSIS — E119 Type 2 diabetes mellitus without complications: Secondary | ICD-10-CM

## 2021-10-05 DIAGNOSIS — I420 Dilated cardiomyopathy: Secondary | ICD-10-CM | POA: Diagnosis present

## 2021-10-05 DIAGNOSIS — F411 Generalized anxiety disorder: Secondary | ICD-10-CM | POA: Diagnosis present

## 2021-10-05 DIAGNOSIS — F1190 Opioid use, unspecified, uncomplicated: Secondary | ICD-10-CM | POA: Diagnosis present

## 2021-10-05 DIAGNOSIS — Z800 Family history of malignant neoplasm of digestive organs: Secondary | ICD-10-CM | POA: Diagnosis not present

## 2021-10-05 DIAGNOSIS — Z87891 Personal history of nicotine dependence: Secondary | ICD-10-CM | POA: Diagnosis not present

## 2021-10-05 DIAGNOSIS — L0390 Cellulitis, unspecified: Secondary | ICD-10-CM | POA: Diagnosis present

## 2021-10-05 DIAGNOSIS — Z9114 Patient's other noncompliance with medication regimen: Secondary | ICD-10-CM | POA: Diagnosis not present

## 2021-10-05 DIAGNOSIS — I5022 Chronic systolic (congestive) heart failure: Secondary | ICD-10-CM | POA: Diagnosis not present

## 2021-10-05 DIAGNOSIS — I5043 Acute on chronic combined systolic (congestive) and diastolic (congestive) heart failure: Secondary | ICD-10-CM | POA: Diagnosis present

## 2021-10-05 DIAGNOSIS — F1110 Opioid abuse, uncomplicated: Secondary | ICD-10-CM | POA: Diagnosis present

## 2021-10-05 LAB — ECHOCARDIOGRAM COMPLETE
AR max vel: 3.35 cm2
AV Area VTI: 2.77 cm2
AV Area mean vel: 3.32 cm2
AV Mean grad: 1 mmHg
AV Peak grad: 1.7 mmHg
Ao pk vel: 0.65 m/s
Area-P 1/2: 6.12 cm2
Calc EF: 14.6 %
Height: 74 in
S' Lateral: 6.3 cm
Single Plane A2C EF: 19.9 %
Single Plane A4C EF: 11.7 %
Weight: 4162.28 oz

## 2021-10-05 LAB — RAPID URINE DRUG SCREEN, HOSP PERFORMED
Amphetamines: POSITIVE — AB
Barbiturates: NOT DETECTED
Benzodiazepines: NOT DETECTED
Cocaine: NOT DETECTED
Opiates: NOT DETECTED
Tetrahydrocannabinol: NOT DETECTED

## 2021-10-05 LAB — HIV ANTIBODY (ROUTINE TESTING W REFLEX): HIV Screen 4th Generation wRfx: NONREACTIVE

## 2021-10-05 LAB — TROPONIN I (HIGH SENSITIVITY): Troponin I (High Sensitivity): 52 ng/L — ABNORMAL HIGH (ref <18)

## 2021-10-05 MED ORDER — OXYCODONE HCL 5 MG PO TABS
5.0000 mg | ORAL_TABLET | Freq: Four times a day (QID) | ORAL | Status: DC | PRN
  Administered 2021-10-05 – 2021-10-08 (×4): 5 mg via ORAL
  Filled 2021-10-05 (×5): qty 1

## 2021-10-05 MED ORDER — ONDANSETRON HCL 4 MG/2ML IJ SOLN
4.0000 mg | Freq: Four times a day (QID) | INTRAMUSCULAR | Status: DC | PRN
  Administered 2021-10-08 – 2021-10-09 (×2): 4 mg via INTRAVENOUS
  Filled 2021-10-05 (×2): qty 2

## 2021-10-05 MED ORDER — KETOROLAC TROMETHAMINE 30 MG/ML IJ SOLN: 30.0000 mg | Freq: Four times a day (QID) | INTRAMUSCULAR | Status: DC | PRN

## 2021-10-05 MED ORDER — SODIUM CHLORIDE 0.9 % IV SOLN
2.0000 g | INTRAVENOUS | Status: DC
  Administered 2021-10-05 – 2021-10-09 (×5): 2 g via INTRAVENOUS
  Filled 2021-10-05 (×5): qty 20

## 2021-10-05 MED ORDER — IRBESARTAN 75 MG PO TABS
37.5000 mg | ORAL_TABLET | Freq: Every day | ORAL | Status: DC
  Administered 2021-10-05: 37.5 mg via ORAL
  Filled 2021-10-05: qty 0.5

## 2021-10-05 MED ORDER — SODIUM CHLORIDE 0.9 % IV SOLN: 250.0000 mL | INTRAVENOUS | Status: DC | PRN

## 2021-10-05 MED ORDER — ACETAMINOPHEN 325 MG PO TABS: 650.0000 mg | ORAL_TABLET | ORAL | Status: DC | PRN

## 2021-10-05 MED ORDER — ALBUTEROL SULFATE (2.5 MG/3ML) 0.083% IN NEBU
3.0000 mL | INHALATION_SOLUTION | Freq: Four times a day (QID) | RESPIRATORY_TRACT | Status: DC | PRN
  Administered 2021-10-05: 3 mL via RESPIRATORY_TRACT
  Filled 2021-10-05: qty 3

## 2021-10-05 MED ORDER — DIGOXIN 125 MCG PO TABS
0.1250 mg | ORAL_TABLET | Freq: Every day | ORAL | Status: DC
  Administered 2021-10-05 – 2021-10-09 (×5): 0.125 mg via ORAL
  Filled 2021-10-05 (×5): qty 1

## 2021-10-05 MED ORDER — SACUBITRIL-VALSARTAN 24-26 MG PO TABS
1.0000 | ORAL_TABLET | Freq: Two times a day (BID) | ORAL | Status: DC
  Administered 2021-10-05 – 2021-10-09 (×8): 1 via ORAL
  Filled 2021-10-05 (×8): qty 1

## 2021-10-05 MED ORDER — CARVEDILOL 3.125 MG PO TABS
3.1250 mg | ORAL_TABLET | Freq: Two times a day (BID) | ORAL | Status: DC
  Administered 2021-10-05: 3.125 mg via ORAL
  Filled 2021-10-05: qty 1

## 2021-10-05 MED ORDER — PERFLUTREN LIPID MICROSPHERE
1.0000 mL | INTRAVENOUS | Status: AC | PRN
  Administered 2021-10-05: 2 mL via INTRAVENOUS
  Filled 2021-10-05: qty 10

## 2021-10-05 MED ORDER — FUROSEMIDE 10 MG/ML IJ SOLN
40.0000 mg | INTRAMUSCULAR | Status: DC
  Filled 2021-10-05: qty 4

## 2021-10-05 MED ORDER — SPIRONOLACTONE 25 MG PO TABS
25.0000 mg | ORAL_TABLET | Freq: Every day | ORAL | Status: DC
  Administered 2021-10-05 – 2021-10-09 (×5): 25 mg via ORAL
  Filled 2021-10-05 (×5): qty 1

## 2021-10-05 MED ORDER — FUROSEMIDE 10 MG/ML IJ SOLN
40.0000 mg | Freq: Two times a day (BID) | INTRAMUSCULAR | Status: DC
  Administered 2021-10-05: 40 mg via INTRAVENOUS
  Filled 2021-10-05: qty 4

## 2021-10-05 MED ORDER — METHOCARBAMOL 500 MG PO TABS
500.0000 mg | ORAL_TABLET | Freq: Three times a day (TID) | ORAL | Status: DC | PRN
  Administered 2021-10-05 – 2021-10-08 (×4): 500 mg via ORAL
  Filled 2021-10-05 (×4): qty 1

## 2021-10-05 MED ORDER — FUROSEMIDE 10 MG/ML IJ SOLN: 40.0000 mg | Freq: Every day | INTRAMUSCULAR | Status: DC

## 2021-10-05 MED ORDER — SODIUM CHLORIDE 0.9% FLUSH
3.0000 mL | Freq: Two times a day (BID) | INTRAVENOUS | Status: DC
  Administered 2021-10-05 – 2021-10-09 (×9): 3 mL via INTRAVENOUS

## 2021-10-05 MED ORDER — ENOXAPARIN SODIUM 40 MG/0.4ML IJ SOSY
40.0000 mg | PREFILLED_SYRINGE | INTRAMUSCULAR | Status: DC
  Administered 2021-10-08: 40 mg via SUBCUTANEOUS
  Filled 2021-10-05 (×3): qty 0.4

## 2021-10-05 MED ORDER — FUROSEMIDE 10 MG/ML IJ SOLN
80.0000 mg | Freq: Two times a day (BID) | INTRAMUSCULAR | Status: DC
  Administered 2021-10-05 – 2021-10-07 (×5): 80 mg via INTRAVENOUS
  Filled 2021-10-05 (×6): qty 8

## 2021-10-05 MED ORDER — FUROSEMIDE 10 MG/ML IJ SOLN: 40.0000 mg | Freq: Two times a day (BID) | INTRAMUSCULAR | Status: DC

## 2021-10-05 MED ORDER — MOMETASONE FURO-FORMOTEROL FUM 200-5 MCG/ACT IN AERO
2.0000 | INHALATION_SPRAY | Freq: Two times a day (BID) | RESPIRATORY_TRACT | Status: DC
  Administered 2021-10-05 – 2021-10-09 (×8): 2 via RESPIRATORY_TRACT
  Filled 2021-10-05: qty 8.8

## 2021-10-05 MED ORDER — FUROSEMIDE 10 MG/ML IJ SOLN
40.0000 mg | INTRAMUSCULAR | Status: AC
  Administered 2021-10-05: 40 mg via INTRAVENOUS
  Filled 2021-10-05: qty 4

## 2021-10-05 MED ORDER — SODIUM CHLORIDE 0.9% FLUSH: 3.0000 mL | INTRAVENOUS | Status: DC | PRN

## 2021-10-05 NOTE — ED Notes (Signed)
Pt refused lovenox shot and stated its my body and I can do what I want and I dont want that. Will continue to monitor.

## 2021-10-05 NOTE — TOC Benefit Eligibility Note (Signed)
Patient Advocate Encounter  Prior Authorization for Entresto 24-26 mg has been approved.    PA# [id number] Effective dates: 10/05/2021 through 10/05/2022  Patients co-pay is $4.00.     Lyndel Safe, [place] Patient Advocate Specialist [place] Patient Advocate Team Direct Number: [phone]  Fax: [phone]

## 2021-10-05 NOTE — Consult Note (Addendum)
Advanced Heart Failure Team Consult Note   Primary Physician: Pcp, No PCP-Cardiologist:  None  Reason for Consultation: Acute on chronic systolic CHF  HPI:    Cameron Jenkins is seen today for evaluation of acute on chronic systolic CHF at the request of Dr. Jacinta Shoe with [facility].   46 y.o. male with history of chronic systolic CHF with EF [id number], LBBB, HTN, OSA, hx COPD, anxiety, prior tobacco use, hx noncompliance, hx polysubstance abuse. Diagnosed while admitted to a hospital in [place] with CHF. No records from admission available. There was mention of possible LVAD placement and was discharged with LifeVest. He lost insurance and was not seen for follow-up.  He established care with Dr. Agustin Cree in November 2021. Last seen in June 2022. Admitted to psych unit in September 2022 for detox from heroin. He had episode of syncope/? Possible asystole. Had very brief CPR. He was transferred to ICU at [facility]. He was seen by Cardiology. Echo with EF 15-20%. EP considered placement of CRT-D. Currently homeless and living in his vehicle. Defibrillator placement deferred until social situation more stable. Discharged home on carvedilol, spiro, valsartan and furosemide 40 daily. Did not follow-up with Cardiology after discharge.  Seen in ED at [facility] on 10/03/21 with chest tightness and dyspnea. Left before evaluated. Presented to [facility] ED yesterday evening with shortness of breath. Has been out of medications 2.5 months. Labs significant for BNP 1326, HS troponin 47>52, Scr 1.17. UDS positive for amphetamines. ECG with sinus tachycardia and known LBBB. Started 40 mg lasix IV daily then increased to 40 IV BID. Also started on IV rocephin for cellulitis of abdomen. Admitted to hospitalist service for management of a/c CHF and cellulitis.   Currently hypertensive with narrow pulse pressure. Sinus tachycardia noted. Diuresing well with IV lasix.  Echo today with EF < 20%, RV moderate to severely  reduced, mild MR, dilated IVC   Currently living in his car. Stays in [place] or [place]. Has an ex spouse and child in the area.   Reports much less frequent use of heroin and amphetamines since discharge but still using periodically when feels depressed. At one point had been sober for 15 years.   Significant family hx substance abuse.   Review of Systems: [y] = yes, [ ]  = no   General: Weight gain [Y]; Weight loss [ ] ; Anorexia [ ] ; Fatigue [ ] ; Fever [ ] ; Chills [ ] ; Weakness [ ]   Cardiac: Chest pain/pressure [ ] ; Resting SOB [Y]; Exertional SOB [Y]; Orthopnea [Y]; Pedal Edema [Y]; Palpitations [ ] ; Syncope [ ] ; Presyncope [ ] ; Paroxysmal nocturnal dyspnea[Y]  Pulmonary: Cough [ ] ; Wheezing[ ] ; Hemoptysis[ ] ; Sputum [ ] ; Snoring [ ]   GI: Vomiting[ ] ; Dysphagia[ ] ; Melena[ ] ; Hematochezia [ ] ; Heartburn[ ] ; Abdominal pain [ ] ; Constipation [ ] ; Diarrhea [ ] ; BRBPR [ ]   GU: Hematuria[ ] ; Dysuria [ ] ; Nocturia[ ]   Vascular: Pain in legs with walking [ ] ; Pain in feet with lying flat [ ] ; Non-healing sores [ ] ; Stroke [ ] ; TIA [ ] ; Slurred speech [ ] ;  Neuro: Headaches[ ] ; Vertigo[ ] ; Seizures[ ] ; Paresthesias[ ] ;Blurred vision [ ] ; Diplopia [ ] ; Vision changes [ ]   Ortho/Skin: Arthritis [ ] ; Joint pain [ ] ; Muscle pain [ ] ; Joint swelling [ ] ; Back Pain [ ] ; Rash [ ]   Psych: Depression[ ] ; Anxiety[ ]   Heme: Bleeding problems [ ] ; Clotting disorders [ ] ; Anemia [ ]   Endocrine: Diabetes [ ] ; Thyroid dysfunction[ ]   Home Medications Prior to Admission medications   Medication Sig Start Date End Date Taking? Authorizing Provider  albuterol (PROVENTIL) (5 MG/ML) 0.5% nebulizer solution Take 2.5 mg by nebulization every 6 (six) hours as needed for wheezing or shortness of breath.   Yes [provider]  budesonide-formoterol (SYMBICORT) 160-4.5 MCG/ACT inhaler Inhale 2 puffs into the lungs 2 (two) times daily.   Yes [provider]  carvedilol (COREG) 6.25 MG tablet Take 1  tablet (6.25 mg total) by mouth 2 (two) times daily with a meal. Patient taking differently: Take 3.125 mg by mouth 2 (two) times daily with a meal. 08/04/20  Yes Park Liter, MD  furosemide (LASIX) 40 MG tablet Take 1 tablet (40 mg total) by mouth daily. 02/23/21 10/05/21 Yes Park Liter, MD  potassium chloride (KLOR-CON) 10 MEQ tablet Take 1 tablet (10 mEq total) by mouth daily. 02/23/21 10/05/21 Yes Park Liter, MD  spironolactone (ALDACTONE) 25 MG tablet Take 25 mg by mouth daily.   Yes [provider]  valsartan (DIOVAN) 40 MG tablet Take 1 tablet (40 mg total) by mouth 2 (two) times daily. 08/04/20  Yes Park Liter, MD    Past Medical History: Past Medical History:  Diagnosis Date   Acute on chronic systolic congestive heart failure ([place]) 05/28/2020   Formatting of this note might be different from the original. IMO update 2021   Chest pain 05/28/2020   Congestive heart failure (CHF) ([place]) 07/21/2020   COPD (chronic obstructive pulmonary disease) ([place])    Dilated cardiomyopathy ([place]) 07/21/2020   Dyspnea on exertion 07/21/2020   Essential hypertension 05/28/2020   Generalized anxiety disorder 05/28/2020   Heart failure (HCC)    LBBB (left bundle branch block) 05/28/2020   Nausea and vomiting 05/28/2020   Obstructive sleep apnea 07/21/2020   Pneumonia due to COVID-19 virus 05/28/2020   Formatting of this note might be different from the original. Diagnosed by SARS-CoV-2 PCR Cephid test at [facility] in [place], [place] on 05/28/2020.   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     Past Surgical History: Past Surgical History:  Procedure Laterality Date   NO PAST SURGERIES      Family History: Family History  Problem Relation Age of Onset   COPD Mother    Heart disease Mother    Heart attack Father    Stroke Father    Bone cancer Maternal Grandmother    Stomach cancer Maternal Grandfather     Social History: Social  History   Socioeconomic History   Marital status: Unknown    Spouse name: Not on file   Number of children: Not on file   Years of education: Not on file   Highest education level: Not on file  Occupational History   Not on file  Tobacco Use   Smoking status: Former    Types: Cigarettes    Quit date: 07/21/2013    Years since quitting: 8.2   Smokeless tobacco: Never  Substance and Sexual Activity   Alcohol use: Never   Drug use: Not Currently    Types: Marijuana   Sexual activity: Not on file  Other Topics Concern   Not on file  Social History Narrative   Not on file   Social Determinants of Health   Financial Resource Strain: Not on file  Food Insecurity: Not on file  Transportation Needs: Not on file  Physical Activity: Not on file  Stress: Not on file  Social Connections: Not on file    Allergies:  Allergies  Allergen Reactions   Acetaminophen     Pt reports nausea    Objective:    Vital Signs:   Temp:  [97.6 F (36.4 C)-99.1 F (37.3 C)] 97.6 F (36.4 C) (01/20 0735) Pulse Rate:  [100-119] 100 (01/20 1015) Resp:  [10-31] 19 (01/20 1015) BP: (110-151)/(92-118) 131/100 (01/20 1015) SpO2:  [82 %-100 %] 100 % (01/20 1015) Weight:  [id number] kg] 118 kg (01/20 0223)    Weight change: Filed Weights   10/05/21 0223  Weight: 118 kg    Intake/Output:   Intake/Output Summary (Last 24 hours) at 10/05/2021 1138 Last data filed at 10/05/2021 1018 Gross per 24 hour  Intake 100 ml  Output 2875 ml  Net -2775 ml      Physical Exam    General:  No distress.  HEENT: normal Neck: supple. JVP to ear. Carotids 2+ bilat; no bruits. No lymphadenopathy or thyromegaly appreciated. Cor: PMI nondisplaced. Regular rate & rhythm, tachy. No rubs, gallops or murmurs. Lungs: clear Abdomen: soft, nontender, nondistended. No hepatosplenomegaly. No bruits or masses. Good bowel sounds. Extremities: no cyanosis, clubbing, rash, 3+ LE edema Neuro: alert & orientedx3, cranial  nerves grossly intact. moves all 4 extremities w/o difficulty. Affect pleasant   Telemetry   Sinus tach 100s  EKG    Sinus tach 100s, LBBB with QRS > 150 ms  Labs   Basic Metabolic Panel: Recent Labs  Lab 10/04/21 2004  NA 137  K 4.6  CL 106  CO2 26  GLUCOSE 121*  BUN 15  CREATININE 1.17  CALCIUM 8.4*    Liver Function Tests: No results for input(s): AST, ALT, ALKPHOS, BILITOT, PROT, ALBUMIN in the last 168 hours. No results for input(s): LIPASE, AMYLASE in the last 168 hours. No results for input(s): AMMONIA in the last 168 hours.  CBC: Recent Labs  Lab 10/04/21 2004  WBC 6.9  HGB 15.4  HCT 49.8  MCV 89.2  PLT 275    Cardiac Enzymes: No results for input(s): CKTOTAL, CKMB, CKMBINDEX, TROPONINI in the last 168 hours.  BNP: BNP (last 3 results) Recent Labs    10/04/21 2004  BNP 1,326.2*    ProBNP (last 3 results) No results for input(s): PROBNP in the last 8760 hours.   CBG: No results for input(s): GLUCAP in the last 168 hours.  Coagulation Studies: No results for input(s): LABPROT, INR in the last 72 hours.   Imaging   DG Chest 2 View  Result Date: 10/04/2021 CLINICAL DATA:  Shortness of breath EXAM: CHEST - 2 VIEW COMPARISON:  10/03/2021 FINDINGS: Stable cardiac enlargement. Could not exclude pericardial effusion. The mediastinal and hilar contours are within normal limits. The lungs are clear. No infiltrates or effusions. No pulmonary lesions. No pneumothorax. The bony thorax is intact. IMPRESSION: 1. Stable cardiac enlargement. Could not exclude pericardial effusion. 2. No infiltrates or effusions. Electronically Signed   By: Marijo Sanes M.D.   On: 10/04/2021 20:25     Medications:     Current Medications:  carvedilol  3.125 mg Oral BID WC   enoxaparin (LOVENOX) injection  40 mg Subcutaneous Q24H   furosemide  40 mg Intravenous Q12H   irbesartan  37.5 mg Oral Daily   mometasone-formoterol  2 puff Inhalation BID   sodium chloride  flush  3 mL Intravenous Q12H   spironolactone  25 mg Oral Daily    Infusions:  sodium chloride     cefTRIAXone (ROCEPHIN)  IV Stopped (10/05/21 0825)      Patient Profile   45 y.o. male with hx chronic systolic CHF, LBBB, hx polysubstance abuse, medical noncompliance, HTN, COPD. Admitted with acute on chronic systolic CHF and cellulitis.  Assessment/Plan   Acute systolic CHF/presumed NICM: -Diagnosed February 2021 while admitted for HF in [place]. EF 10% at that time -EF 15-20% on echo 09/22 at [facility] -Echo today EF < 20%, RV moderate to severely reduced, mild MR, dilated IVC -Expect CM at least in part d/t LBBB -Now admit with a/c CHF after out of medications for over 2 months -Diuresing with IV lasix - increase to 80 mg IV BID -Hold beta blocker for now with a/c CHF -Continue spiro 25 mg daily -Switch irbesartan to entresto 24/26 BID -SGLT2i next -Eventual R/LHC once diuresed -TED hose  2. LBBB: -likely contributing to cardiomyopathy -QRS < 150 ms -Syncopal episode/possible asystole 09/22. Brief CPR. CRT-D considered at that time, not done d/t social situation -Can reconsider down the line  3. HTN: -BP elevated -Meds as above  4. Polysubstance abuse: -Hx heroin abuse -UDS this admit positive for amphetamines -Consult CSW to assist with resources in community  SDOH: Homeless. Living in car. Has medicaid and disability. Will consult TOC CSW and HF clinic SW. Look into options for temporary housing and food.  Length of Stay: 0  FINCH, LINDSAY N, PA-C  10/05/2021, 11:38 AM  Advanced Heart Failure Team Pager [phone] (M-F; 7a - 5p)  Please contact [facility] for night-coverage after hours (4p -7a ) and weekends on [email or web address]   Patient seen and examined with the above-signed Advanced Practice Provider and/or Housestaff. I personally reviewed laboratory data, imaging studies and relevant notes. I independently examined the patient and formulated the important  aspects of the plan. I have edited the note to reflect any of my changes or salient points. I have personally discussed the plan with the patient and/or family.   46 y/o male with substance abuse, HTN, OSA, LBBB and systolic HF. Presents with a/c systolic HF after running out of his medications.   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 Started using 4 years ago after his mother got him back into it.   Now living in has car. Was seen at [facility] in 9/22 and offered CTR-D due to suspected LBBB CM but he refused because he said his life wasn't in the right spot for it.   Continues to live in his car. Says he isn't really using much any more. Ran out of meds 2 months ago.   Also has cellulitis on his abdomen.   Echo 10-15% RV moderately-severely decreased  ECG sinus LBBB 160 ms  General:  Sitting up No resp difficulty HEENT: normal Neck: supple. JVP to ear  Carotids 2+ bilat; no bruits. No lymphadenopathy or thryomegaly appreciated. Cor: PMI nondisplaced. Regular rate & rhythm. No rubs, gallops or murmurs. Lungs: clear Abdomen: soft, nontender, + distended. + cellulitis No hepatosplenomegaly. No bruits or masses. Good bowel sounds. Extremities: no cyanosis, clubbing, rash, 3+ edema Neuro: alert & orientedx3, cranial nerves grossly intact. moves all 4 extremities w/o difficulty. Affect pleasant  He has severe systolic likely due to LBBB CM +/- substance abuse. He has not has ischemic eval.   Agree with IV diuresis. Add digoxin and spiro. TED hose. Long discussion about his situation and what help we can offer him from [place] including helps with meds and housing.   If  he is interested in being more aggressive will need and CRT-D down the road.  Agree with abx for cellulitis.  Glori Bickers, MD  1:22 PM

## 2021-10-05 NOTE — ED Triage Notes (Signed)
Pt checked in earlier and left prior to being called to a room and he returned for his SOB. Hx CHF and HTN, out of meds for 2.5 months. See previous chart.

## 2021-10-05 NOTE — ED Provider Notes (Signed)
[facility] EMERGENCY DEPARTMENT Provider Note   CSN: [id number] Arrival date & time: 10/05/21  0152     History  Chief Complaint  Patient presents with   Shortness of Breath    Cameron Jenkins is a 46 y.o. male.  The history is provided by the patient and medical records.   46 y.o. M here with SOB.  Hx CHF with EF 10-20%, opioid use disorder, HTN, anxiety, COPD, presenting to the ED for SOB.  States he has been off all of his medications for about 2.5 months.  He was previously followed by cardiology but states he has been lost to follow-up.  He reports worsening LE swelling and trouble with laying flat.  He denies any chest pain.  I just evaluated patient <1 hour ago, planned admission to medicine service for diuresis.  He apparently got upset that he could not receive ice water so left AMA.  States he made it to the parking lot and realized he was too SOB to go home so decided to come back to ED.  States he will stay for admission.  Home Medications Prior to Admission medications   Medication Sig Start Date End Date Taking? Authorizing Provider  albuterol (PROVENTIL) (5 MG/ML) 0.5% nebulizer solution Take 2.5 mg by nebulization every 6 (six) hours as needed for wheezing or shortness of breath.    [provider]  ALPRAZolam Prudy Feeler) 0.25 MG tablet Take 1/2 of tab 1 hour prior to blood work appt. 02/23/21   Georgeanna Lea, MD  budesonide-formoterol [facility]) 160-4.5 MCG/ACT inhaler Inhale 2 puffs into the lungs 2 (two) times daily.    [provider]  carvedilol (COREG) 6.25 MG tablet Take 1 tablet (6.25 mg total) by mouth 2 (two) times daily with a meal. 08/04/20   Georgeanna Lea, MD  co-enzyme Q-10 30 MG capsule Take 30 mg by mouth daily.    [provider]  furosemide (LASIX) 40 MG tablet Take 1 tablet (40 mg total) by mouth daily. 02/23/21 05/24/21  Georgeanna Lea, MD  potassium chloride (KLOR-CON) 10 MEQ tablet Take 1  tablet (10 mEq total) by mouth daily. 02/23/21 05/24/21  Georgeanna Lea, MD  spironolactone (ALDACTONE) 25 MG tablet Take 12.5 mg by mouth daily.    [provider]  valsartan (DIOVAN) 40 MG tablet Take 1 tablet (40 mg total) by mouth 2 (two) times daily. 08/04/20   Georgeanna Lea, MD      Allergies    Acetaminophen    Review of Systems   Review of Systems  Respiratory:  Positive for shortness of breath.   All other systems reviewed and are negative.  Physical Exam Updated Vital Signs BP (!) 151/107 (BP Location: Right Arm)    Pulse (!) 119    Temp 99.1 F (37.3 C) (Oral)    Resp 17    SpO2 96%   Physical Exam Vitals and nursing note reviewed.  Constitutional:      Appearance: He is well-developed.  HENT:     Head: Normocephalic and atraumatic.  Eyes:     Conjunctiva/sclera: Conjunctivae normal.     Pupils: Pupils are equal, round, and reactive to light.  Cardiovascular:     Rate and Rhythm: Normal rate and regular rhythm.     Heart sounds: Normal heart sounds.  Pulmonary:     Effort: Pulmonary effort is normal. No respiratory distress.     Breath sounds: Normal breath sounds. No rhonchi.  Abdominal:  General: Bowel sounds are normal.     Palpations: Abdomen is soft.  Musculoskeletal:        General: Normal range of motion.     Cervical back: Normal range of motion.     Comments: 3+ pitting edema BLE  Skin:    General: Skin is warm and dry.  Neurological:     Mental Status: He is alert and oriented to person, place, and time.    ED Results / Procedures / Treatments   Labs (all labs ordered are listed, but only abnormal results are displayed) Labs Reviewed - No data to display  EKG None  Radiology DG Chest 2 View  Result Date: 10/04/2021 CLINICAL DATA:  Shortness of breath EXAM: CHEST - 2 VIEW COMPARISON:  10/03/2021 FINDINGS: Stable cardiac enlargement. Could not exclude pericardial effusion. The mediastinal and hilar contours are within  normal limits. The lungs are clear. No infiltrates or effusions. No pulmonary lesions. No pneumothorax. The bony thorax is intact. IMPRESSION: 1. Stable cardiac enlargement. Could not exclude pericardial effusion. 2. No infiltrates or effusions. Electronically Signed   By: Rudie Meyer M.D.   On: 10/04/2021 20:25    Procedures Procedures    Medications Ordered in ED Medications - No data to display  ED Course/ Medical Decision Making/ A&P                           Medical Decision Making Risk Prescription drug management. Decision regarding hospitalization.   46 y.o. M here with SOB.  Personally evaluated patient <1 hour ago, planned admission for CHF exacerbation with BNP 1300+ and trops of 47 and 52 respectively.  CXR without frank pulmonary edema.  EKG without new ischemic changes.  Suspected some demand ischemia from fluid overload causing elevated troponin.    Patient briefly left the ED as he got upset that he was not given water in a timely fashion, however made it to his car and realized he was too SOB so returned to the ED.  He is agreeable to admission at this time.    Discussed with hospitalist, Dr. Julian Reil, once again-- will try and admit.  PIV inserted, given dose of IV lasix.  Final Clinical Impression(s) / ED Diagnoses Final diagnoses:  Acute congestive heart failure, unspecified heart failure type [facility])    Rx / DC Orders ED Discharge Orders     None         Garlon Hatchet, PA-C 10/05/21 0457    Gilda Crease, MD 10/05/21 [phone]

## 2021-10-05 NOTE — ED Notes (Addendum)
Went to check on pt d/t desatting and maintaining in the 80's. Had to sternal rub pt to get him to respond. Placed pt on 2L O2 via [place]. Notified primary RN

## 2021-10-05 NOTE — ED Notes (Signed)
Pt stating he wants to speak to someone in charge and no one can touch him anymore. This RN will continue to monitor.

## 2021-10-05 NOTE — ED Provider Notes (Signed)
[facility] EMERGENCY DEPARTMENT Provider Note   CSN: [id number] Arrival date & time: 10/04/21  1816     History  Chief Complaint  Patient presents with   Shortness of Breath    Cameron Jenkins is a 46 y.o. male.  The history is provided by the patient and medical records.  Shortness of Breath  46 year old male with history of severe cardiomyopathy with EF of 10 to 20%, ventricular tachycardia, anxiety, hypertension, presenting to the ED with shortness of breath.  Was previously followed by cardiology and [facility], not currently seeing anyone.  He has been out of all of his medications for approximately 2-1/2 months.  Reports has had progressively worsening swelling in his lower extremities all the way up to his abdomen.  States it makes it very painful for him to walk.  Does feel short of breath, especially with lying flat.  He is unsure of any weight gain.  Home Medications Prior to Admission medications   Medication Sig Start Date End Date Taking? Authorizing Provider  albuterol (PROVENTIL) (5 MG/ML) 0.5% nebulizer solution Take 2.5 mg by nebulization every 6 (six) hours as needed for wheezing or shortness of breath.    [provider]  ALPRAZolam Prudy Feeler) 0.25 MG tablet Take 1/2 of tab 1 hour prior to blood work appt. 02/23/21   Georgeanna Lea, MD  budesonide-formoterol [facility]) 160-4.5 MCG/ACT inhaler Inhale 2 puffs into the lungs 2 (two) times daily.    [provider]  carvedilol (COREG) 6.25 MG tablet Take 1 tablet (6.25 mg total) by mouth 2 (two) times daily with a meal. 08/04/20   Georgeanna Lea, MD  co-enzyme Q-10 30 MG capsule Take 30 mg by mouth daily.    [provider]  furosemide (LASIX) 40 MG tablet Take 1 tablet (40 mg total) by mouth daily. 02/23/21 05/24/21  Georgeanna Lea, MD  potassium chloride (KLOR-CON) 10 MEQ tablet Take 1 tablet (10 mEq total) by mouth daily. 02/23/21 05/24/21  Georgeanna Lea, MD  spironolactone (ALDACTONE) 25 MG tablet Take 12.5 mg by mouth daily.    [provider]  valsartan (DIOVAN) 40 MG tablet Take 1 tablet (40 mg total) by mouth 2 (two) times daily. 08/04/20   Georgeanna Lea, MD      Allergies    Acetaminophen    Review of Systems   Review of Systems  Respiratory:  Positive for shortness of breath.   Cardiovascular:  Positive for leg swelling.  All other systems reviewed and are negative.  Physical Exam Updated Vital Signs BP (!) 133/108    Pulse (!) 105    Temp 97.7 F (36.5 C) (Oral)    Resp 18    Ht 6\' 2"  (1.88 m)    Wt 118.4 kg    SpO2 98%    BMI 33.51 kg/m   Physical Exam Vitals and nursing note reviewed.  Constitutional:      Appearance: He is well-developed.  HENT:     Head: Normocephalic and atraumatic.  Eyes:     Conjunctiva/sclera: Conjunctivae normal.     Pupils: Pupils are equal, round, and reactive to light.  Cardiovascular:     Rate and Rhythm: Normal rate and regular rhythm.     Heart sounds: Normal heart sounds.  Pulmonary:     Effort: Pulmonary effort is normal.     Breath sounds: Normal breath sounds.  Abdominal:     General: Bowel sounds are normal.  Palpations: Abdomen is soft.  Musculoskeletal:        General: Normal range of motion.     Cervical back: Normal range of motion.     Comments: 3+ pitting edema BLE  Skin:    General: Skin is warm and dry.  Neurological:     Mental Status: He is alert and oriented to person, place, and time.    ED Results / Procedures / Treatments   Labs (all labs ordered are listed, but only abnormal results are displayed) Labs Reviewed  BASIC METABOLIC PANEL - Abnormal; Notable for the following components:      Result Value   Glucose, Bld 121 (*)    Calcium 8.4 (*)    All other components within normal limits  BRAIN NATRIURETIC PEPTIDE - Abnormal; Notable for the following components:   B Natriuretic Peptide 1,326.2 (*)    All other components within  normal limits  TROPONIN I (HIGH SENSITIVITY) - Abnormal; Notable for the following components:   Troponin I (High Sensitivity) 47 (*)    All other components within normal limits  RESP PANEL BY RT-PCR (FLU A&B, COVID) ARPGX2  CBC  TROPONIN I (HIGH SENSITIVITY)    EKG EKG Interpretation  Date/Time:  Friday October 05 2021 00:17:29 EST Ventricular Rate:  107 PR Interval:  181 QRS Duration: 165 QT Interval:  380 QTC Calculation: 507 R Axis:   126 Text Interpretation: Sinus tachycardia Lateral infarct, old Anterior infarct, acute (LAD) Prolonged QT interval No acute changes No significant change since last tracing Confirmed by Derwood KaplanNanavati, Ankit [phone]([id number]) on 10/05/2021 12:20:45 AM  Radiology DG Chest 2 View  Result Date: 10/04/2021 CLINICAL DATA:  Shortness of breath EXAM: CHEST - 2 VIEW COMPARISON:  10/03/2021 FINDINGS: Stable cardiac enlargement. Could not exclude pericardial effusion. The mediastinal and hilar contours are within normal limits. The lungs are clear. No infiltrates or effusions. No pulmonary lesions. No pneumothorax. The bony thorax is intact. IMPRESSION: 1. Stable cardiac enlargement. Could not exclude pericardial effusion. 2. No infiltrates or effusions. Electronically Signed   By: Rudie MeyerP.  Gallerani M.D.   On: 10/04/2021 20:25    Procedures Procedures    Medications Ordered in ED Medications - No data to display  ED Course/ Medical Decision Making/ A&P                           Medical Decision Making Risk Prescription drug management. Decision regarding hospitalization.   46 year old male here with shortness of breath.  Has history of severe cardiomyopathy with EF of 10 to 20%.  Has been out of all of his medications for the past 2 and half months.  He is no longer followed by cardiology.  He is afebrile and nontoxic in appearance here.  He does appear clinically fluid overloaded with 3+ pitting edema bilateral lower extremities.  EKG is nonischemic and unchanged from  prior.  Labs ordered and reviewed-- BNP 1300+, trop 47.  I suspect this is demand ischemia from fluid overload.  Will give dose of IV Lasix.  Plan for admission.  Discussed with Dr. Julian ReilGardner-- will admit for ongoing care.  Per hospitalist-- went to evaluate patient but he had left AMA.  Apparently, he was upset that he did not receive any ice water.  Patient could not be located in the building or immediate parking lot.  Final Clinical Impression(s) / ED Diagnoses Final diagnoses:  Acute on chronic congestive heart failure, unspecified heart failure type (HCC)  Rx / DC Orders ED Discharge Orders     None         Garlon Hatchet, PA-C 10/05/21 0132    Gilda Crease, MD 10/05/21 0730

## 2021-10-05 NOTE — Progress Notes (Signed)
Heart Failure Navigator Progress Note ° °Assessed for Heart & Vascular TOC clinic readiness.  °Patient does not meet criteria due to AHF rounding team consulted this hospitalization.  ° °Navigator available for educational resources. ° °Analiz Tvedt, MSN, RN °Heart Failure Nurse Navigator °[phone] ° ° °

## 2021-10-05 NOTE — ED Notes (Signed)
While RN was in CT with another patient, this patient left AMA and told other staff that he was angry he could not get ice water.

## 2021-10-05 NOTE — ED Notes (Signed)
Pt refusing IV and medication at this time. Requested to try again later.

## 2021-10-05 NOTE — TOC Initial Note (Addendum)
Transition of Care [facility]) - Initial/Assessment Note    Patient Details  Name: Cameron Jenkins MRN: [id number] Date of Birth: Apr 26, 1976  [facility] CM/SW Contact:    Cameron Luckenbaugh, LCSW Phone Number: 10/05/2021, 3:58 PM  Clinical Narrative:                 Inpatient HF CSW, and outpatient HF CSW, Cameron Jenkins spoke with Cameron Jenkins at bedside to provide support and active listening and gain insight to best provide support to Cameron Jenkins. Cameron Jenkins reported that he was getting food stamps but isn't any longer and his address on file is his brother's address but he is estranged from his family. Cameron Jenkins reported previous substance use for coping with circumstance and he has been living in his car for the last 8 months. Cameron Jenkins reported that he gets SSI roughly $934 a month and uses that for gas, storage unit, phone bill, food, and showering at truck stops. [name] is open to help from the CSW and we will start the search for housing at the Cameron Jenkins, group homes, etc. CSW to work with Cameron Jenkins to get his mail and utilize their resources. CSW to follow up on Food Stamps and other resources for support. Both inpatient and outpatient CSW's provided the patient with the social workers name, number and position and if anything changes to please reach out so that the CSW can provide support.  CSW will continue to follow throughout discharge.    Barriers to Discharge: No Barriers Identified   Patient Goals and CMS Choice        Expected Discharge Plan and Services         Living arrangements for the past 2 months: Homeless                                      Prior Living Arrangements/Services Living arrangements for the past 2 months: Homeless Lives with:: Self Patient language and need for interpreter reviewed:: Yes        Need for Family Participation in Patient Care: No (Comment) Care giver support system in place?: No (comment)   Criminal Activity/Legal  Involvement Pertinent to Current Situation/Hospitalization: No - Comment as needed  Activities of Daily Living Home Assistive Devices/Equipment: None ADL Screening (condition at time of admission) Patient's cognitive ability adequate to safely complete daily activities?: Yes Is the patient deaf or have difficulty hearing?: No Does the patient have difficulty seeing, even when wearing glasses/contacts?: No Does the patient have difficulty concentrating, remembering, or making decisions?: No Patient able to express need for assistance with ADLs?: Yes Does the patient have difficulty dressing or bathing?: No Independently performs ADLs?: Yes (appropriate for developmental age) Does the patient have difficulty walking or climbing stairs?: No Weakness of Legs: None Weakness of Arms/Hands: None  Permission Sought/Granted Permission sought to share information with : Case Manager                Emotional Assessment Appearance:: Appears stated age Attitude/Demeanor/Rapport: Engaged Affect (typically observed): Pleasant Orientation: : Oriented to Self, Oriented to Place, Oriented to  Time, Oriented to Situation   Psych Involvement: No (comment)  Admission diagnosis:  Acute on chronic systolic CHF (congestive heart failure) (HCC) [I50.23] Acute congestive heart failure, unspecified heart failure type [facility]) [I50.9] Patient Active Problem List   Diagnosis Date Noted   History of cardiac arrest 10/05/2021  Acute on chronic systolic CHF (congestive heart failure) (HCC) 10/05/2021   Opioid use disorder, severe, dependence (HCC) 06/09/2021   Homeless 06/08/2021   Polysubstance abuse (HCC) 06/08/2021   Opioid use disorder 06/08/2021   Heart failure (HCC)    Significant birth injury of newborn    Congestive heart failure (CHF) (HCC) 07/21/2020   Dilated cardiomyopathy (HCC) 07/21/2020   Dyspnea on exertion 07/21/2020   Obstructive sleep apnea 07/21/2020   COPD (chronic obstructive  pulmonary disease) (HCC) 05/28/2020   Acute on chronic systolic congestive heart failure (HCC) 05/28/2020   Chest pain 05/28/2020   Essential hypertension 05/28/2020   Generalized anxiety disorder 05/28/2020   Nausea and vomiting 05/28/2020   Pneumonia due to COVID-19 virus 05/28/2020   LBBB (left bundle branch block) 05/28/2020   PCP:  Pcp, No Pharmacy:   [id number] [place], [place] - [address] [place] [address] Phone: [phone] Fax: [phone]  [id number] - [place], [place] - [address] [facility] [place] [address] Phone: [phone] Fax: [phone]     Social Determinants of Health (SDOH) Interventions    Readmission Risk Interventions No flowsheet data found.  Cameron Jenkins, MSW, LCSWA [phone] Heart Failure Social Worker

## 2021-10-05 NOTE — Progress Notes (Signed)
Patient seen and examined, admitted earlier this morning by Dr. Alcario Drought, please see his H&P for details, briefly Cameron Jenkins is a 45/M with history of chronic systolic CHF, dilated cardiomyopathy EF of 15-20%, LBBB, hypertension, polysubstance abuse, COPD, history of cardiac arrest in 05/2021, homeless lives out of his car, presented to the ED 1/20 with shortness of breath, worsening lower extremity edema, ran out of all his meds 2 to 3 months ago, also has a rash on his abdomen, initially left AMA and then came back to the ED  Acute on chronic systolic CHF -Known cardiomyopathy, EF of 15-20% -Continue IV Lasix today, will increase dose to twice daily, continue Aldactone, ARB -Monitor on telemetry -Social work consult  Abdominal wall cellulitis -Continue ceftriaxone, monitor clinically  Hypertension -Continue ARB, diuretics as above  History of polysubstance abuse -Denies IV drug use -UDS positive for amphetamines  Homelessness Depression -TOC consult  Hyperglycemia -Check HbA1c  DVT prophylaxis: Lovenox CODE STATUS DNR per patient's wishes  Cameron Polite, MD

## 2021-10-05 NOTE — H&P (Addendum)
History and Physical    Cameron HeadingsRobert Jenkins RUE:[id number] DOB: 09/28/1975 DOA: 10/05/2021  PCP: Pcp, No  Patient coming from: Home  I have personally briefly reviewed patient's [facility]  Chief Complaint: SOB  HPI: Cameron HeadingsRobert Jenkins is a 46 y.o. male with medical history significant of polysubstance abuse, HFrEF with EF 15-20%, LBBB, HTN, cardiac arrest in Sept 2022, COPD.  Pt is homeless, lives out of his car.  Due to poor QoL at baseline (from homelessness and disability) pt has declined AICD placement.  Pt presents to ED today with c/o SOB, BLE edema, progressively worsening over past couple of months.  Pt ran out of CHF and HTN meds 2.5 months ago.  Now has severe SOB, CP.  Orthopnea.  Sob worse with ambulation.  Also has cellulitis of abdomen.   ED Course: Initially pt left AMA from ED, but became too severely SOB and had to come back.   Past Medical History:  Diagnosis Date   Acute on chronic systolic congestive heart failure (HCC) 05/28/2020   Formatting of this note might be different from the original. IMO update 2021   Chest pain 05/28/2020   Congestive heart failure (CHF) (HCC) 07/21/2020   COPD (chronic obstructive pulmonary disease) (HCC)    Dilated cardiomyopathy (HCC) 07/21/2020   Dyspnea on exertion 07/21/2020   Essential hypertension 05/28/2020   Generalized anxiety disorder 05/28/2020   Heart failure (HCC)    LBBB (left bundle branch block) 05/28/2020   Nausea and vomiting 05/28/2020   Obstructive sleep apnea 07/21/2020   Pneumonia due to COVID-19 virus 05/28/2020   Formatting of this note might be different from the original. Diagnosed by SARS-CoV-2 PCR [facility]id test at [facility] [place], [place] on 05/28/2020.   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 His smoking use included  cigarettes. He has never used smokeless tobacco. He reports that he does not currently use drugs after having used the following drugs: Marijuana. He reports that he does not drink alcohol.  Allergies  Allergen Reactions   Acetaminophen     Pt reports nausea    Family History  Problem Relation Age of Onset   COPD Mother    Heart disease Mother    Heart attack Father    Stroke Father    Bone cancer Maternal Grandmother    Stomach cancer Maternal Grandfather     Prior to Admission medications   Medication Sig Start Date End Date Taking? Authorizing Provider  albuterol (PROVENTIL) (5 MG/ML) 0.5% nebulizer solution Take 2.5 mg by nebulization every 6 (six) hours as needed for wheezing or shortness of breath.   Yes [provider]  budesonide-formoterol (SYMBICORT) 160-4.5 MCG/ACT inhaler Inhale 2 puffs into the lungs 2 (two) times daily.   Yes [provider]  carvedilol (COREG) 6.25 MG tablet Take 1 tablet (6.25 mg total) by mouth 2 (two) times daily with a meal. Patient taking differently: Take 3.125 mg by mouth 2 (two) times daily with a meal. 08/04/20  Yes Georgeanna LeaKrasowski, Carr J, MD  furosemide (LASIX) 40 MG tablet Take 1 tablet (40 mg total) by mouth daily. 02/23/21 10/05/21 Yes Georgeanna LeaKrasowski, Olman J, MD  potassium chloride (KLOR-CON) 10 MEQ tablet Take 1 tablet (10 mEq total) by mouth daily. 02/23/21 10/05/21 Yes Bing MatterKrasowski,  Marveen Reeks, MD  spironolactone (ALDACTONE) 25 MG tablet Take 25 mg by mouth daily.   Yes [provider]  valsartan (DIOVAN) 40 MG tablet Take 1 tablet (40 mg total) by mouth 2 (two) times daily. 08/04/20  Yes Georgeanna Lea, MD    Physical Exam: Vitals:   10/05/21 0203 10/05/21 0223 10/05/21 0351 10/05/21 0400  BP: (!) 151/107  (!) 123/107 (!) 129/103  Pulse: (!) 119  (!) 110 (!) 105  Resp: 17  (!) 21 17  Temp: 99.1 F (37.3 C)     TempSrc: Oral     SpO2: 96%  98% 100%  Weight:  118 kg    Height:  6\' 2"  (1.88 m)       Constitutional: NAD, calm, comfortable Eyes: PERRL, lids and conjunctivae normal ENMT: Mucous membranes are moist. Posterior pharynx clear of any exudate or lesions.Normal dentition.  Neck: normal, supple, no masses, no thyromegaly Respiratory: clear to auscultation bilaterally, no wheezing, no crackles. Normal respiratory effort. No accessory muscle use.  Cardiovascular: Tachycardic 3+ pitting edema BLE. 2+ pedal pulses. No carotid bruits.  Abdomen: TTP over lower abdomen (due to skin cellulitis) Musculoskeletal: no clubbing / cyanosis. No joint deformity upper and lower extremities. Good ROM, no contractures. Normal muscle tone.  Skin: Pt with cellulitis of lower abdomen Neurologic: CN 2-12 grossly intact. Sensation intact, DTR normal. Strength 5/5 in all 4.  Psychiatric: Normal judgment and insight. Alert and oriented x 3. Normal mood.    Labs on Admission: I have personally reviewed following labs and imaging studies  CBC: Recent Labs  Lab 10/04/21 2004  WBC 6.9  HGB 15.4  HCT 49.8  MCV 89.2  PLT 275   Basic Metabolic Panel: Recent Labs  Lab 10/04/21 2004  NA 137  K 4.6  CL 106  CO2 26  GLUCOSE 121*  BUN 15  CREATININE 1.17  CALCIUM 8.4*   GFR: Estimated Creatinine Clearance: 110 mL/min (by C-G formula based on SCr of 1.17 mg/dL). Liver Function Tests: No results for input(s): AST, ALT, ALKPHOS, BILITOT, PROT, ALBUMIN in the last 168 hours. No results for input(s): LIPASE, AMYLASE in the last 168 hours. No results for input(s): AMMONIA in the last 168 hours. Coagulation Profile: No results for input(s): INR, PROTIME in the last 168 hours. Cardiac Enzymes: No results for input(s): CKTOTAL, CKMB, CKMBINDEX, TROPONINI in the last 168 hours. BNP (last 3 results) No results for input(s): PROBNP in the last 8760 hours. HbA1C: No results for input(s): HGBA1C in the last 72 hours. CBG: No results for input(s): GLUCAP in the last 168 hours. Lipid Profile: No  results for input(s): CHOL, HDL, LDLCALC, TRIG, CHOLHDL, LDLDIRECT in the last 72 hours. Thyroid Function Tests: No results for input(s): TSH, T4TOTAL, FREET4, T3FREE, THYROIDAB in the last 72 hours. Anemia Panel: No results for input(s): VITAMINB12, FOLATE, FERRITIN, TIBC, IRON, RETICCTPCT in the last 72 hours. Urine analysis: No results found for: COLORURINE, APPEARANCEUR, LABSPEC, PHURINE, GLUCOSEU, HGBUR, BILIRUBINUR, KETONESUR, PROTEINUR, UROBILINOGEN, NITRITE, LEUKOCYTESUR  Radiological Exams on Admission: DG Chest 2 View  Result Date: 10/04/2021 CLINICAL DATA:  Shortness of breath EXAM: CHEST - 2 VIEW COMPARISON:  10/03/2021 FINDINGS: Stable cardiac enlargement. Could not exclude pericardial effusion. The mediastinal and hilar contours are within normal limits. The lungs are clear. No infiltrates or effusions. No pulmonary lesions. No pneumothorax. The bony thorax is intact. IMPRESSION: 1. Stable cardiac enlargement. Could not exclude pericardial effusion. 2. No infiltrates or effusions. Electronically Signed   By: 10/05/2021.  Gallerani M.D.   On: 10/04/2021 20:25    EKG: Independently reviewed.  Assessment/Plan Principal Problem:   Acute on chronic systolic congestive heart failure (HCC) Active Problems:   Dilated cardiomyopathy (HCC)   Essential hypertension   Homeless   Polysubstance abuse (HCC)   Opioid use disorder   History of cardiac arrest    Patient has acute decompensated CHF: Patient presents with: ORTHOPNEA and dyspnea on exertion / increased shortness of breath Exam findings include: bilateral leg edema and tachycardia Lasix 40mg  IV in ED then 40mg  IV daily Cont home aldactone Cont ARB Cont BB 2d echo ordered Tele monitor Tried discussing AICD / dual chamber PPM with patient: Pt declines this at this time Says if his heart were to stop at this point he wouldn't want to be brought back. Patient states he doesn't feel this will improve his EF or QoL Likewise, dont  think hes a candidate for (nor would he consider) at the moment, any advanced therapies. Cellulitis of abdomen - Rocephin HTN - Cont home BP meds Polysubstance abuse (including PO opoid) Denies IVDU Check UDS SW consult Offered suboxone but pt denies withdrawal at this point Trying non-narcotic alternatives for pain due to cellulitis of abdomen given h/o heroin abuse (see HPR documentation from the 06/08/21 admission) Depression - Do think pt has some degree of depression Denies SI or actively wanting to die (says he wants to live) But says he has accepted that his CHF will eventually kill him May want to either consider starting SSRI vs psych consult in AM On the other hand, much of his depression stems from his poor quality of life, homelessness, etc. Homelessness - SW consult  DVT prophylaxis: Lovenox Code Status: DNR - at pt request Family Communication: No family in room, doesn't sound like he gets along with his family Disposition Plan: TBD Consults called: None Admission status: Place in 61    Robbin Loughmiller M. DO [facility]  How to contact the [facility] Attending or Consulting provider 7A - 7P or covering provider during after hours 7P -7A, for this patient?  Check the care team in [facility] and look for a) attending/consulting TR[facility] provider listed and b) the [facility] team listed Log into [email or web address]  Amion Physician Scheduling and messaging for groups and whole hospitals  On call and physician scheduling software for group practices, residents, hospitalists and other medical providers for call, clinic, rotation and shift schedules. OnCall Enterprise is a hospital-wide system for scheduling doctors and paging doctors on call. EasyPlot is for scientific plotting and data analysis.  [email or web address]  and use [place]'s universal password to access. If you do not have the password, please contact the hospital operator.  Locate the [facility] provider you are looking for under [facility] and page to a number that you can be directly reached. If you still have difficulty reaching the provider, please page the [facility] (Director on Call) for the Hospitalists listed on amion for assistance.  10/05/2021, 4:27 AM

## 2021-10-06 DIAGNOSIS — I5023 Acute on chronic systolic (congestive) heart failure: Secondary | ICD-10-CM | POA: Diagnosis not present

## 2021-10-06 LAB — MAGNESIUM: Magnesium: 1.9 mg/dL (ref 1.7–2.4)

## 2021-10-06 LAB — BASIC METABOLIC PANEL
Anion gap: 9 (ref 5–15)
BUN: 17 mg/dL (ref 6–20)
CO2: 26 mmol/L (ref 22–32)
Calcium: 8.7 mg/dL — ABNORMAL LOW (ref 8.9–10.3)
Chloride: 100 mmol/L (ref 98–111)
Creatinine, Ser: 1.12 mg/dL (ref 0.61–1.24)
GFR, Estimated: >60 mL/min (ref >60)
Glucose, Bld: 152 mg/dL — ABNORMAL HIGH (ref 70–99)
Potassium: 3.7 mmol/L (ref 3.5–5.1)
Sodium: 135 mmol/L (ref 135–145)

## 2021-10-06 MED ORDER — MAGNESIUM SULFATE 2 GM/50ML IV SOLN
2.0000 g | Freq: Once | INTRAVENOUS | Status: AC
  Administered 2021-10-06: 2 g via INTRAVENOUS
  Filled 2021-10-06: qty 50

## 2021-10-06 MED ORDER — POTASSIUM CHLORIDE CRYS ER 20 MEQ PO TBCR
40.0000 meq | EXTENDED_RELEASE_TABLET | Freq: Once | ORAL | Status: AC
  Administered 2021-10-06: 40 meq via ORAL
  Filled 2021-10-06: qty 2

## 2021-10-06 NOTE — Progress Notes (Signed)
PROGRESS NOTE    Cameron Jenkins  PXT:[id number] DOB: June 01, 1976 DOA: 10/05/2021 PCP: Pcp, No  Brief Narrative:45/M with history of chronic systolic CHF, dilated cardiomyopathy EF of 15-20%, LBBB, hypertension, polysubstance abuse, COPD, history of cardiac arrest in 05/2021, homeless lives out of his car, presented to the ED 1/20 with shortness of breath, worsening lower extremity edema, ran out of all his meds 2 to 3 months ago, also has a rash on his abdomen, initially left AMA and then came back to the ED. -Improving with diuresis  Subjective: -Feels better today  Assessment & Plan:  Acute on chronic systolic CHF -Known dilated cardiomyopathy, EF of 15-20% -Improving with diuresis, is 6.1 L negative -Continue Lasix 80 Mg every 12, also started on Entresto, Aldactone, digoxin -Creatinine stable, monitor I's and O's, weights, BMP in a.m. -Social work consult   Abdominal wall cellulitis -Continue ceftriaxone, CBC in a.m.   Hypertension -Improving with diuresis, above meds   History of polysubstance abuse -Denies IV drug use, counseled -UDS positive for amphetamines   Homelessness Depression -TOC consulted   Hyperglycemia -Check HbA1c   DVT prophylaxis: Lovenox Code Status: Full code Family Communication: No family Disposition Plan:  Inpatient  Consultants:  Cardiology  Procedures:    Objective: Vitals:   10/06/21 0730 10/06/21 0938 10/06/21 0941 10/06/21 1032  BP: 106/82   117/74  Pulse: 100  90 86  Resp: 19   14  Temp: 98 F (36.7 C)   98 F (36.7 C)  TempSrc: Oral   Oral  SpO2: 95% 95% 94% 92%  Weight:      Height:        Intake/Output Summary (Last 24 hours) at 10/06/2021 1100 Last data filed at 10/06/2021 0948 Gross per 24 hour  Intake 1063 ml  Output 5005 ml  Net -3942 ml   Filed Weights   10/05/21 0223 10/05/21 1350  Weight: 118 kg 115.3 kg    Examination:  General exam: Pleasant male sitting up in bed, AAOx3, no distress Respiratory  system: Bilateral Rales in lower lungs Cardiovascular system: S1 & S2 heard, RRR.  Abd: nondistended, soft and nontender., erythema warmth and multiple 3 to 4 cm discolored lesions Central nervous system: Alert and oriented. No focal neurological deficits. Extremities: 2+ edema Skin: As above Psychiatry: Mood & affect appropriate.     Data Reviewed:   CBC: Recent Labs  Lab 10/04/21 2004  WBC 6.9  HGB 15.4  HCT 49.8  MCV 89.2  PLT 275   Basic Metabolic Panel: Recent Labs  Lab 10/04/21 2004 10/06/21 0401  NA 137 135  K 4.6 3.7  CL 106 100  CO2 26 26  GLUCOSE 121* 152*  BUN 15 17  CREATININE 1.17 1.12  CALCIUM 8.4* 8.7*  MG  --  1.9   GFR: Estimated Creatinine Clearance: 113.6 mL/min (by C-G formula based on SCr of 1.12 mg/dL). Liver Function Tests: No results for input(s): AST, ALT, ALKPHOS, BILITOT, PROT, ALBUMIN in the last 168 hours. No results for input(s): LIPASE, AMYLASE in the last 168 hours. No results for input(s): AMMONIA in the last 168 hours. Coagulation Profile: No results for input(s): INR, PROTIME in the last 168 hours. Cardiac Enzymes: No results for input(s): CKTOTAL, CKMB, CKMBINDEX, TROPONINI in the last 168 hours. BNP (last 3 results) No results for input(s): PROBNP in the last 8760 hours. HbA1C: No results for input(s): HGBA1C in the last 72 hours. CBG: No results for input(s): GLUCAP in the last 168 hours. Lipid Profile:  No results for input(s): CHOL, HDL, LDLCALC, TRIG, CHOLHDL, LDLDIRECT in the last 72 hours. Thyroid Function Tests: No results for input(s): TSH, T4TOTAL, FREET4, T3FREE, THYROIDAB in the last 72 hours. Anemia Panel: No results for input(s): VITAMINB12, FOLATE, FERRITIN, TIBC, IRON, RETICCTPCT in the last 72 hours. Urine analysis: No results found for: COLORURINE, APPEARANCEUR, LABSPEC, PHURINE, GLUCOSEU, HGBUR, BILIRUBINUR, KETONESUR, PROTEINUR, UROBILINOGEN, NITRITE, LEUKOCYTESUR Sepsis  Labs: @LABRCNTIP (procalcitonin:4,lacticidven:4)  ) Recent Results (from the past 240 hour(s))  Resp Panel by RT-PCR (Flu A&B, Covid) Nasopharyngeal Swab     Status: None   Collection Time: 10/04/21  7:37 PM   Specimen: Nasopharyngeal Swab; Nasopharyngeal(NP) swabs in vial transport medium  Result Value Ref Range Status   SARS Coronavirus 2 by RT PCR NEGATIVE NEGATIVE Final    Comment: (NOTE) SARS-CoV-2 target nucleic acids are NOT DETECTED.  The SARS-CoV-2 RNA is generally detectable in upper respiratory specimens during the acute phase of infection. The lowest concentration of SARS-CoV-2 viral copies this assay can detect is 138 copies/mL. A negative result does not preclude SARS-Cov-2 infection and should not be used as the sole basis for treatment or other patient management decisions. A negative result may occur with  improper specimen collection/handling, submission of specimen other than nasopharyngeal swab, presence of viral mutation(s) within the areas targeted by this assay, and inadequate number of viral copies(<138 copies/mL). A negative result must be combined with clinical observations, patient history, and epidemiological information. The expected result is Negative.  Fact Sheet for Patients:  BloggerCourse.com  Fact Sheet for Healthcare Providers:  SeriousBroker.it  This test is no t yet approved or cleared by the [place] FDA and  has been authorized for detection and/or diagnosis of SARS-CoV-2 by FDA under an Emergency Use Authorization (EUA). This EUA will remain  in effect (meaning this test can be used) for the duration of the COVID-19 declaration under Section 564(b)(1) of the Act, 21 U.S.C.section 360bbb-3(b)(1), unless the authorization is terminated  or revoked sooner.       Influenza A by PCR NEGATIVE NEGATIVE Final   Influenza B by PCR NEGATIVE NEGATIVE Final    Comment: (NOTE) The Xpert  Xpress SARS-CoV-2/FLU/RSV plus assay is intended as an aid in the diagnosis of influenza from Nasopharyngeal swab specimens and should not be used as a sole basis for treatment. Nasal washings and aspirates are unacceptable for Xpert Xpress SARS-CoV-2/FLU/RSV testing.  Fact Sheet for Patients: BloggerCourse.com  Fact Sheet for Healthcare Providers: SeriousBroker.it  This test is not yet approved or cleared by the [place] FDA and has been authorized for detection and/or diagnosis of SARS-CoV-2 by FDA under an Emergency Use Authorization (EUA). This EUA will remain in effect (meaning this test can be used) for the duration of the COVID-19 declaration under Section 564(b)(1) of the Act, 21 U.S.C. section 360bbb-3(b)(1), unless the authorization is terminated or revoked.  Performed at [facility], [address], [place], [place] [address]      Radiology Studies: DG Chest 2 View  Result Date: 10/04/2021 CLINICAL DATA:  Shortness of breath EXAM: CHEST - 2 VIEW COMPARISON:  10/03/2021 FINDINGS: Stable cardiac enlargement. Could not exclude pericardial effusion. The mediastinal and hilar contours are within normal limits. The lungs are clear. No infiltrates or effusions. No pulmonary lesions. No pneumothorax. The bony thorax is intact. IMPRESSION: 1. Stable cardiac enlargement. Could not exclude pericardial effusion. 2. No infiltrates or effusions. Electronically Signed   By: Rudie Meyer M.D.   On: 10/04/2021 20:25  ECHOCARDIOGRAM COMPLETE  Result Date: 10/05/2021    ECHOCARDIOGRAM REPORT   Patient Name:   Cameron Jenkins Date of Exam: 10/05/2021 Medical Rec #:  [id number]     Height:       74.0 in Accession #:    [id number]    Weight:       260.1 lb Date of Birth:  07-17-1976     BSA:          2.430 m Patient Age:    45 years      BP:           131/100 mmHg Patient Gender: M             HR:           107 bpm. Exam Location:   Inpatient Procedure: 2D Echo, Cardiac Doppler, Color Doppler and Intracardiac            Opacification Agent Indications:    CHF  History:        Patient has no prior history of Echocardiogram examinations.                 Cardiomyopathy, COPD, Arrythmias:LBBB, Signs/Symptoms:Chest                 Pain; Risk Factors:Hypertension.  Sonographer:    Vanetta Shawl Referring Phys: [phone] JARED M GARDNER IMPRESSIONS  1. Left ventricular ejection fraction, by estimation, is <20%. The left ventricle has severely decreased function. The left ventricle demonstrates global hypokinesis. The left ventricular internal cavity size was severely dilated. Left ventricular diastolic parameters are indeterminate.  2. Right ventricular systolic function is mildly reduced. The right ventricular size is moderately enlarged. There is moderately elevated pulmonary artery systolic pressure.  3. Right atrial size was mildly dilated.  4. The mitral valve is grossly normal. Mild mitral valve regurgitation.  5. The aortic valve is tricuspid. Aortic valve regurgitation is not visualized.  6. The inferior vena cava is dilated in size with <50% respiratory variability, suggesting right atrial pressure of 15 mmHg. Comparison(s): No prior Echocardiogram. FINDINGS  Left Ventricle: Left ventricular ejection fraction, by estimation, is <20%. The left ventricle has severely decreased function. The left ventricle demonstrates global hypokinesis. The left ventricular internal cavity size was severely dilated. Left ventricular diastolic parameters are indeterminate. Right Ventricle: The right ventricular size is moderately enlarged. No increase in right ventricular wall thickness. Right ventricular systolic function is mildly reduced. There is moderately elevated pulmonary artery systolic pressure. The tricuspid regurgitant velocity is 3.24 m/s, and with an assumed right atrial pressure of 15 mmHg, the estimated right ventricular systolic pressure is 57.0  mmHg. Left Atrium: Left atrial size was normal in size. Right Atrium: Right atrial size was mildly dilated. Pericardium: There is no evidence of pericardial effusion. Mitral Valve: The mitral valve is grossly normal. Mild mitral valve regurgitation. Tricuspid Valve: The tricuspid valve is grossly normal. Tricuspid valve regurgitation is mild. Aortic Valve: The aortic valve is tricuspid. Aortic valve regurgitation is not visualized. Aortic valve mean gradient measures 1.0 mmHg. Aortic valve peak gradient measures 1.7 mmHg. Aortic valve area, by VTI measures 2.77 cm. Pulmonic Valve: The pulmonic valve was grossly normal. Pulmonic valve regurgitation is trivial. Aorta: The aortic root and ascending aorta are structurally normal, with no evidence of dilitation. Venous: The inferior vena cava is dilated in size with less than 50% respiratory variability, suggesting right atrial pressure of 15 mmHg. IAS/Shunts: No atrial level shunt detected by color flow Doppler.  LEFT  VENTRICLE PLAX 2D LVIDd:         6.90 cm      Diastology LVIDs:         6.30 cm      LV e' medial:    24.40 cm/s LV PW:         1.30 cm      LV E/e' medial:  4.5 LV IVS:        1.30 cm      LV e' lateral:   16.60 cm/s LVOT diam:     2.20 cm      LV E/e' lateral: 6.7 LV SV:         27 LV SV Index:   11 LVOT Area:     3.80 cm  LV Volumes (MOD) LV vol d, MOD A2C: 326.0 ml LV vol d, MOD A4C: 332.0 ml LV vol s, MOD A2C: 261.0 ml LV vol s, MOD A4C: 293.0 ml LV SV MOD A2C:     65.0 ml LV SV MOD A4C:     332.0 ml LV SV MOD BP:      48.5 ml RIGHT VENTRICLE             IVC RV Basal diam:  6.40 cm     IVC diam: 2.50 cm RV Mid diam:    4.10 cm RV S prime:     11.40 cm/s LEFT ATRIUM              Index        RIGHT ATRIUM           Index LA diam:        4.90 cm  2.02 cm/m   RA Area:     29.50 cm LA Vol (A2C):   83.6 ml  34.40 ml/m  RA Volume:   109.00 ml 44.85 ml/m LA Vol (A4C):   106.2 ml 43.68 ml/m LA Biplane Vol: 121.0 ml 49.79 ml/m  AORTIC VALVE                     PULMONIC VALVE AV Area (Vmax):    3.35 cm     PV Vmax:       0.64 m/s AV Area (Vmean):   3.32 cm     PV Peak grad:  1.6 mmHg AV Area (VTI):     2.77 cm AV Vmax:           64.60 cm/s AV Vmean:          46.200 cm/s AV VTI:            0.096 m AV Peak Grad:      1.7 mmHg AV Mean Grad:      1.0 mmHg LVOT Vmax:         56.90 cm/s LVOT Vmean:        40.400 cm/s LVOT VTI:          0.070 m LVOT/AV VTI ratio: 0.73  AORTA Ao Root diam: 3.30 cm Ao Asc diam:  3.40 cm MITRAL VALVE                TRICUSPID VALVE MV Area (PHT): 6.12 cm     TR Peak grad:   42.0 mmHg MV Decel Time: 124 msec     TR Vmax:        324.00 cm/s MV E velocity: 111.00 cm/s  SHUNTS                             Systemic VTI:  0.07 m                             Systemic Diam: 2.20 cm Carolan ClinesMary Branch Electronically signed by Carolan ClinesMary Branch Signature Date/Time: 10/05/2021/12:06:15 PM    Final      Scheduled Meds:  digoxin  0.125 mg Oral Daily   enoxaparin (LOVENOX) injection  40 mg Subcutaneous Q24H   furosemide  80 mg Intravenous BID   mometasone-formoterol  2 puff Inhalation BID   sacubitril-valsartan  1 tablet Oral BID   sodium chloride flush  3 mL Intravenous Q12H   spironolactone  25 mg Oral Daily   Continuous Infusions:  sodium chloride     cefTRIAXone (ROCEPHIN)  IV Stopped (10/06/21 0942)     LOS: 1 day    Time spent: 30min    Zannie CovePreetha Dayja Loveridge, MD [facility]   10/06/2021, 11:00 AM

## 2021-10-06 NOTE — Progress Notes (Addendum)
Advanced Heart Failure Rounding Note   Subjective:    Diuresing well with IV lasix. Weight down 6 pounds. Breathing better. Demies SOB, orthopnea or PND.   Scr stable   On abx for ab wall celluilits  Objective:   Weight Range:  Vital Signs:   Temp:  [97.9 F (36.6 C)-98.5 F (36.9 C)] 98 F (36.7 C) (01/21 1032) Pulse Rate:  [86-106] 86 (01/21 1032) Resp:  [14-19] 14 (01/21 1032) BP: (106-139)/(74-102) 117/74 (01/21 1032) SpO2:  [92 %-98 %] 92 % (01/21 1032) Weight:  [115.3 kg] 115.3 kg (01/20 1350) Last BM Date: 10/03/21  Weight change: Filed Weights   10/05/21 0223 10/05/21 1350  Weight: 118 kg 115.3 kg    Intake/Output:   Intake/Output Summary (Last 24 hours) at 10/06/2021 1349 Last data filed at 10/06/2021 1314 Gross per 24 hour  Intake 1540 ml  Output 6105 ml  Net -4565 ml     Physical Exam: General:  Sitting up in chair No resp difficulty HEENT: normal Neck: supple. JVP jaw . Carotids 2+ bilat; no bruits. No lymphadenopathy or thryomegaly appreciated. Cor: PMI nondisplaced. Regular rate & rhythm. No rubs, gallops or murmurs. Lungs: clear Abdomen: soft, nontender, nondistended. No hepatosplenomegaly. No bruits or masses. Good bowel sounds. + cellulitis  Extremities: no cyanosis, clubbing, rash, 2+ edema cool  Neuro: alert & orientedx3, cranial nerves grossly intact. moves all 4 extremities w/o difficulty. Affect pleasant  Telemetry: Sinus 80-90 with LBBB Personally reviewed  Labs: Basic Metabolic Panel: Recent Labs  Lab 10/04/21 2004 10/06/21 0401  NA 137 135  K 4.6 3.7  CL 106 100  CO2 26 26  GLUCOSE 121* 152*  BUN 15 17  CREATININE 1.17 1.12  CALCIUM 8.4* 8.7*  MG  --  1.9    Liver Function Tests: No results for input(s): AST, ALT, ALKPHOS, BILITOT, PROT, ALBUMIN in the last 168 hours. No results for input(s): LIPASE, AMYLASE in the last 168 hours. No results for input(s): AMMONIA in the last 168 hours.  CBC: Recent Labs  Lab  10/04/21 2004  WBC 6.9  HGB 15.4  HCT 49.8  MCV 89.2  PLT 275    Cardiac Enzymes: No results for input(s): CKTOTAL, CKMB, CKMBINDEX, TROPONINI in the last 168 hours.  BNP: BNP (last 3 results) Recent Labs    10/04/21 2004  BNP 1,326.2*    ProBNP (last 3 results) No results for input(s): PROBNP in the last 8760 hours.    Other results:  Imaging: DG Chest 2 View  Result Date: 10/04/2021 CLINICAL DATA:  Shortness of breath EXAM: CHEST - 2 VIEW COMPARISON:  10/03/2021 FINDINGS: Stable cardiac enlargement. Could not exclude pericardial effusion. The mediastinal and hilar contours are within normal limits. The lungs are clear. No infiltrates or effusions. No pulmonary lesions. No pneumothorax. The bony thorax is intact. IMPRESSION: 1. Stable cardiac enlargement. Could not exclude pericardial effusion. 2. No infiltrates or effusions. Electronically Signed   By: Rudie Meyer M.D.   On: 10/04/2021 20:25   ECHOCARDIOGRAM COMPLETE  Result Date: 10/05/2021    ECHOCARDIOGRAM REPORT   Patient Name:   Cameron Jenkins Date of Exam: 10/05/2021 Medical Rec #:  [id number]     Height:       74.0 in Accession #:    [id number]    Weight:       260.1 lb Date of Birth:  [birth year]     BSA:          2.430 m Patient Age:  45 years      BP:           131/100 mmHg Patient Gender: M             HR:           107 bpm. Exam Location:  Inpatient Procedure: 2D Echo, Cardiac Doppler, Color Doppler and Intracardiac            Opacification Agent Indications:    CHF  History:        Patient has no prior history of Echocardiogram examinations.                 Cardiomyopathy, COPD, Arrythmias:LBBB, Signs/Symptoms:Chest                 Pain; Risk Factors:Hypertension.  Sonographer:    Vanetta ShawlBrittney Adkins Referring Phys: [phone] JARED M GARDNER IMPRESSIONS  1. Left ventricular ejection fraction, by estimation, is <20%. The left ventricle has severely decreased function. The left ventricle demonstrates global hypokinesis. The left  ventricular internal cavity size was severely dilated. Left ventricular diastolic parameters are indeterminate.  2. Right ventricular systolic function is mildly reduced. The right ventricular size is moderately enlarged. There is moderately elevated pulmonary artery systolic pressure.  3. Right atrial size was mildly dilated.  4. The mitral valve is grossly normal. Mild mitral valve regurgitation.  5. The aortic valve is tricuspid. Aortic valve regurgitation is not visualized.  6. The inferior vena cava is dilated in size with <50% respiratory variability, suggesting right atrial pressure of 15 mmHg. Comparison(s): No prior Echocardiogram. FINDINGS  Left Ventricle: Left ventricular ejection fraction, by estimation, is <20%. The left ventricle has severely decreased function. The left ventricle demonstrates global hypokinesis. The left ventricular internal cavity size was severely dilated. Left ventricular diastolic parameters are indeterminate. Right Ventricle: The right ventricular size is moderately enlarged. No increase in right ventricular wall thickness. Right ventricular systolic function is mildly reduced. There is moderately elevated pulmonary artery systolic pressure. The tricuspid regurgitant velocity is 3.24 m/s, and with an assumed right atrial pressure of 15 mmHg, the estimated right ventricular systolic pressure is 57.0 mmHg. Left Atrium: Left atrial size was normal in size. Right Atrium: Right atrial size was mildly dilated. Pericardium: There is no evidence of pericardial effusion. Mitral Valve: The mitral valve is grossly normal. Mild mitral valve regurgitation. Tricuspid Valve: The tricuspid valve is grossly normal. Tricuspid valve regurgitation is mild. Aortic Valve: The aortic valve is tricuspid. Aortic valve regurgitation is not visualized. Aortic valve mean gradient measures 1.0 mmHg. Aortic valve peak gradient measures 1.7 mmHg. Aortic valve area, by VTI measures 2.77 cm. Pulmonic Valve: The  pulmonic valve was grossly normal. Pulmonic valve regurgitation is trivial. Aorta: The aortic root and ascending aorta are structurally normal, with no evidence of dilitation. Venous: The inferior vena cava is dilated in size with less than 50% respiratory variability, suggesting right atrial pressure of 15 mmHg. IAS/Shunts: No atrial level shunt detected by color flow Doppler.  LEFT VENTRICLE PLAX 2D LVIDd:         6.90 cm      Diastology LVIDs:         6.30 cm      LV e' medial:    24.40 cm/s LV PW:         1.30 cm      LV E/e' medial:  4.5 LV IVS:        1.30 cm      LV e' lateral:  16.60 cm/s LVOT diam:     2.20 cm      LV E/e' lateral: 6.7 LV SV:         27 LV SV Index:   11 LVOT Area:     3.80 cm  LV Volumes (MOD) LV vol d, MOD A2C: 326.0 ml LV vol d, MOD A4C: 332.0 ml LV vol s, MOD A2C: 261.0 ml LV vol s, MOD A4C: 293.0 ml LV SV MOD A2C:     65.0 ml LV SV MOD A4C:     332.0 ml LV SV MOD BP:      48.5 ml RIGHT VENTRICLE             IVC RV Basal diam:  6.40 cm     IVC diam: 2.50 cm RV Mid diam:    4.10 cm RV S prime:     11.40 cm/s LEFT ATRIUM              Index        RIGHT ATRIUM           Index LA diam:        4.90 cm  2.02 cm/m   RA Area:     29.50 cm LA Vol (A2C):   83.6 ml  34.40 ml/m  RA Volume:   109.00 ml 44.85 ml/m LA Vol (A4C):   106.2 ml 43.68 ml/m LA Biplane Vol: 121.0 ml 49.79 ml/m  AORTIC VALVE                    PULMONIC VALVE AV Area (Vmax):    3.35 cm     PV Vmax:       0.64 m/s AV Area (Vmean):   3.32 cm     PV Peak grad:  1.6 mmHg AV Area (VTI):     2.77 cm AV Vmax:           64.60 cm/s AV Vmean:          46.200 cm/s AV VTI:            0.096 m AV Peak Grad:      1.7 mmHg AV Mean Grad:      1.0 mmHg LVOT Vmax:         56.90 cm/s LVOT Vmean:        40.400 cm/s LVOT VTI:          0.070 m LVOT/AV VTI ratio: 0.73  AORTA Ao Root diam: 3.30 cm Ao Asc diam:  3.40 cm MITRAL VALVE                TRICUSPID VALVE MV Area (PHT): 6.12 cm     TR Peak grad:   42.0 mmHg MV Decel Time: 124 msec      TR Vmax:        324.00 cm/s MV E velocity: 111.00 cm/s                             SHUNTS                             Systemic VTI:  0.07 m                             Systemic Diam: 2.20 cm Carolan ClinesMary Branch Electronically signed by Carolan ClinesMary Branch Signature Date/Time: 10/05/2021/12:06:15  PM    Final      Medications:     Scheduled Medications:  digoxin  0.125 mg Oral Daily   enoxaparin (LOVENOX) injection  40 mg Subcutaneous Q24H   furosemide  80 mg Intravenous BID   mometasone-formoterol  2 puff Inhalation BID   sacubitril-valsartan  1 tablet Oral BID   sodium chloride flush  3 mL Intravenous Q12H   spironolactone  25 mg Oral Daily    Infusions:  sodium chloride     cefTRIAXone (ROCEPHIN)  IV Stopped (10/06/21 0942)    PRN Medications: sodium chloride, albuterol, methocarbamol, ondansetron (ZOFRAN) IV, oxyCODONE, sodium chloride flush   Assessment/Plan:   1. Acute systolic CHF/presumed NICM: -Diagnosed February 2021 while admitted for HF in [place]. EF 10% at that time -EF 15-20% on echo 09/22 at [facility] -Echo 10/05/21 EF < 20%, RV moderate to severely reduced, mild MR, dilated IVC -Expect CM at least in part d/t LBBB -Now admit with a/c CHF after out of medications for over 2 months -Diuresing with IV lasix will continue -Hold beta blocker for now with a/c CHF -Continue spiro 25 mg daily -Continue entresto 24/26 BID - Continue digoxin 0.125  -SGLT2i next -Eventual R/LHC once diuresed -TED hose   2. LBBB: -likely contributing to cardiomyopathy -QRS < 150 ms -Syncopal episode/possible asystole 09/22. Brief CPR. CRT-D considered at that time, not done d/t social situation and ab wall infection  -Can reconsider down the line   3. HTN: -BP improved. Titrate GDMT as tolerated    4. Polysubstance abuse: -Hx heroin abuse -UDS this admit positive for amphetamines - CSW on baord to assist with resources in community  5. Ab wall cellulitis - continue abx per primary team    6.  SDOH: - Homeless. Living in car. Has medicaid and disability - TOC CSW and HF clinic SW have seen and working on him with support   Length of Stay: 1   Arvilla Meres MD 10/06/2021, 1:49 PM  Advanced Heart Failure Team Pager [phone] (M-F; 7a - 4p)  Please contact [facility] for night-coverage after hours (4p -7a ) and weekends on [email or web address]

## 2021-10-07 DIAGNOSIS — I5023 Acute on chronic systolic (congestive) heart failure: Secondary | ICD-10-CM | POA: Diagnosis not present

## 2021-10-07 LAB — CBC
HCT: 55.6 % — ABNORMAL HIGH (ref 39.0–52.0)
Hemoglobin: 18 g/dL — ABNORMAL HIGH (ref 13.0–17.0)
MCH: 27.6 pg (ref 26.0–34.0)
MCHC: 32.4 g/dL (ref 30.0–36.0)
MCV: 85.4 fL (ref 80.0–100.0)
Platelets: 338 10*3/uL (ref 150–400)
RBC: 6.51 MIL/uL — ABNORMAL HIGH (ref 4.22–5.81)
RDW: 15.3 % (ref 11.5–15.5)
WBC: 9.1 10*3/uL (ref 4.0–10.5)
nRBC: 0 % (ref 0.0–0.2)

## 2021-10-07 LAB — BASIC METABOLIC PANEL
Anion gap: 17 — ABNORMAL HIGH (ref 5–15)
BUN: 19 mg/dL (ref 6–20)
CO2: 20 mmol/L — ABNORMAL LOW (ref 22–32)
Calcium: 9.1 mg/dL (ref 8.9–10.3)
Chloride: 98 mmol/L (ref 98–111)
Creatinine, Ser: 1.04 mg/dL (ref 0.61–1.24)
GFR, Estimated: >60 mL/min (ref >60)
Glucose, Bld: 108 mg/dL — ABNORMAL HIGH (ref 70–99)
Potassium: 4.6 mmol/L (ref 3.5–5.1)
Sodium: 135 mmol/L (ref 135–145)

## 2021-10-07 LAB — MAGNESIUM: Magnesium: 2.2 mg/dL (ref 1.7–2.4)

## 2021-10-07 MED ORDER — EMPAGLIFLOZIN 10 MG PO TABS
10.0000 mg | ORAL_TABLET | Freq: Every day | ORAL | Status: DC
  Administered 2021-10-07 – 2021-10-09 (×3): 10 mg via ORAL
  Filled 2021-10-07 (×3): qty 1

## 2021-10-07 NOTE — Progress Notes (Signed)
Does not want to sign consent, will wait until tomorrow, until he gets enough info on the procedure. Procedure on Tuesday.

## 2021-10-07 NOTE — Progress Notes (Signed)
PROGRESS NOTE    Cameron Jenkins  ZOX:[id number] DOB: [birth year] DOA: 10/05/2021 PCP: Pcp, No  Brief Narrative:46/M with history of chronic systolic CHF, dilated cardiomyopathy EF of 15-20%, LBBB, hypertension, polysubstance abuse, COPD, history of cardiac arrest in 05/2021, homeless lives out of his car, presented to the ED 1/20 with shortness of breath, worsening lower extremity edema, ran out of all his meds 2 to 3 months ago, also has a rash on his abdomen, initially left AMA and then came back to the ED. -Improving with diuresis  Subjective: -Feels better overall, no events overnight, breathing much better, swelling coming down nicely  Assessment & Plan:  Acute on chronic systolic CHF -Known dilated cardiomyopathy, EF of 15-20% -Improving well with diuresis, he is 9.3 L negative, weight down 20 pounds -Continue Lasix 80 Mg every 12, also started on Entresto, Aldactone, digoxin -Creatinine stable, monitor I's and O's, weights, BMP in a.m. -Social work consulted, TOC following   Abdominal wall cellulitis -Continue ceftriaxone -Improving with decreased abdominal wall swelling as well   Hypertension -Improving with diuresis, above meds   History of polysubstance abuse -Denies IV drug use, counseled -UDS positive for amphetamines   Homelessness Depression -TOC consulted   Hyperglycemia -Check HbA1c-pending   DVT prophylaxis: Lovenox Code Status: Full code Family Communication: No family at bedside Disposition Plan:  Inpatient  Consultants:  Cardiology  Procedures:    Objective: Vitals:   10/07/21 0513 10/07/21 0536 10/07/21 0753 10/07/21 0821  BP:  120/85  121/82  Pulse: 100 98  93  Resp: 18   20  Temp:  98.1 F (36.7 C)    TempSrc: Oral Oral    SpO2:  95% 98% 98%  Weight:  109 kg    Height:        Intake/Output Summary (Last 24 hours) at 10/07/2021 1017 Last data filed at 10/07/2021 [id number] Gross per 24 hour  Intake 477.33 ml  Output 3575 ml  Net -3097.67  ml   Filed Weights   10/05/21 0223 10/05/21 1350 10/07/21 0536  Weight: 118 kg 115.3 kg 109 kg    Examination:  Pleasant male sitting up in the recliner, AAO x3, no distress CVS: S1-S2, regular rate rhythm Lungs: Bilateral Rales Abdomen: Soft, nontender, erythema and multiple discolored lesions on the abdominal wall Extremities: 1+ edema Skin: As above  Psychiatry: Mood & affect appropriate.     Data Reviewed:   CBC: Recent Labs  Lab 10/04/21 2004 10/07/21 0526  WBC 6.9 9.1  HGB 15.4 18.0*  HCT 49.8 55.6*  MCV 89.2 85.4  PLT 275 338   Basic Metabolic Panel: Recent Labs  Lab 10/04/21 2004 10/06/21 0401 10/07/21 0526  NA 137 135 135  K 4.6 3.7 4.6  CL 106 100 98  CO2 26 26 20*  GLUCOSE 121* 152* 108*  BUN 15 17 19   CREATININE 1.17 1.12 1.04  CALCIUM 8.4* 8.7* 9.1  MG  --  1.9 2.2   GFR: Estimated Creatinine Clearance: 119.1 mL/min (by C-G formula based on SCr of 1.04 mg/dL). Liver Function Tests: No results for input(s): AST, ALT, ALKPHOS, BILITOT, PROT, ALBUMIN in the last 168 hours. No results for input(s): LIPASE, AMYLASE in the last 168 hours. No results for input(s): AMMONIA in the last 168 hours. Coagulation Profile: No results for input(s): INR, PROTIME in the last 168 hours. Cardiac Enzymes: No results for input(s): CKTOTAL, CKMB, CKMBINDEX, TROPONINI in the last 168 hours. BNP (last 3 results) No results for input(s): PROBNP in the last  8760 hours. HbA1C: No results for input(s): HGBA1C in the last 72 hours. CBG: No results for input(s): GLUCAP in the last 168 hours. Lipid Profile: No results for input(s): CHOL, HDL, LDLCALC, TRIG, CHOLHDL, LDLDIRECT in the last 72 hours. Thyroid Function Tests: No results for input(s): TSH, T4TOTAL, FREET4, T3FREE, THYROIDAB in the last 72 hours. Anemia Panel: No results for input(s): VITAMINB12, FOLATE, FERRITIN, TIBC, IRON, RETICCTPCT in the last 72 hours. Urine analysis: No results found for:  COLORURINE, APPEARANCEUR, LABSPEC, PHURINE, GLUCOSEU, HGBUR, BILIRUBINUR, KETONESUR, PROTEINUR, UROBILINOGEN, NITRITE, LEUKOCYTESUR Sepsis Labs: @LABRCNTIP (procalcitonin:4,lacticidven:4)  ) Recent Results (from the past 240 hour(s))  Resp Panel by RT-PCR (Flu A&B, Covid) Nasopharyngeal Swab     Status: None   Collection Time: 10/04/21  7:37 PM   Specimen: Nasopharyngeal Swab; Nasopharyngeal(NP) swabs in vial transport medium  Result Value Ref Range Status   SARS Coronavirus 2 by RT PCR NEGATIVE NEGATIVE Final    Comment: (NOTE) SARS-CoV-2 target nucleic acids are NOT DETECTED.  The SARS-CoV-2 RNA is generally detectable in upper respiratory specimens during the acute phase of infection. The lowest concentration of SARS-CoV-2 viral copies this assay can detect is 138 copies/mL. A negative result does not preclude SARS-Cov-2 infection and should not be used as the sole basis for treatment or other patient management decisions. A negative result may occur with  improper specimen collection/handling, submission of specimen other than nasopharyngeal swab, presence of viral mutation(s) within the areas targeted by this assay, and inadequate number of viral copies(<138 copies/mL). A negative result must be combined with clinical observations, patient history, and epidemiological information. The expected result is Negative.  Fact Sheet for Patients:  10/06/21  Fact Sheet for Healthcare Providers:  BloggerCourse.com  This test is no t yet approved or cleared by the [employer]ious[employer].[place] FDA and  has been authorized for detection and/or diagnosis of SARS-CoV-2 by FDA under an Emergency Use Authorization (EUA). This EUA will remain  in effect (meaning this test can be used) for the duration of the COVID-19 declaration under Section 564(b)(1) of the Act, 21 U.S.C.section 360bbb-3(b)(1), unless the authorization is terminated  or revoked  sooner.       Influenza A by PCR NEGATIVE NEGATIVE Final   Influenza B by PCR NEGATIVE NEGATIVE Final    Comment: (NOTE) The Xpert Xpress SARS-CoV-2/FLU/RSV plus assay is intended as an aid in the diagnosis of influenza from Nasopharyngeal swab specimens and should not be used as a sole basis for treatment. Nasal washings and aspirates are unacceptable for Xpert Xpress SARS-CoV-2/FLU/RSV testing.  Fact Sheet for Patients: [place]  Fact Sheet for Healthcare Providers: BloggerCourse.com  This test is not yet approved or cleared by the SeriousBroker.it FDA and has been authorized for detection and/or diagnosis of SARS-CoV-2 by FDA under an Emergency Use Authorization (EUA). This EUA will remain in effect (meaning this test can be used) for the duration of the COVID-19 declaration under Section 564(b)(1) of the Act, 21 U.S.C. section 360bbb-3(b)(1), unless the authorization is terminated or revoked.  Performed at [facility], [address] [place], [place]      Radiology Studies: ECHOCARDIOGRAM COMPLETE  Result Date: 10/05/2021    ECHOCARDIOGRAM REPORT   Patient Name:   Cameron Jenkins Date of Exam: 10/05/2021 Medical Rec #:  10/07/2021     Height:       74.0 in Accession #:    [id number]    Weight:       260.1 lb Date of Birth:  [birth year]     BSA:          2.430 m Patient Age:    45 years      BP:           131/100 mmHg Patient Gender: M             HR:           107 bpm. Exam Location:  Inpatient Procedure: 2D Echo, Cardiac Doppler, Color Doppler and Intracardiac            Opacification Agent Indications:    CHF  History:        Patient has no prior history of Echocardiogram examinations.                 Cardiomyopathy, COPD, Arrythmias:LBBB, Signs/Symptoms:Chest                 Pain; Risk Factors:Hypertension.  Sonographer:    Vanetta Shawl Referring Phys: [phone] JARED M GARDNER IMPRESSIONS  1. Left ventricular  ejection fraction, by estimation, is <20%. The left ventricle has severely decreased function. The left ventricle demonstrates global hypokinesis. The left ventricular internal cavity size was severely dilated. Left ventricular diastolic parameters are indeterminate.  2. Right ventricular systolic function is mildly reduced. The right ventricular size is moderately enlarged. There is moderately elevated pulmonary artery systolic pressure.  3. Right atrial size was mildly dilated.  4. The mitral valve is grossly normal. Mild mitral valve regurgitation.  5. The aortic valve is tricuspid. Aortic valve regurgitation is not visualized.  6. The inferior vena cava is dilated in size with <50% respiratory variability, suggesting right atrial pressure of 15 mmHg. Comparison(s): No prior Echocardiogram. FINDINGS  Left Ventricle: Left ventricular ejection fraction, by estimation, is <20%. The left ventricle has severely decreased function. The left ventricle demonstrates global hypokinesis. The left ventricular internal cavity size was severely dilated. Left ventricular diastolic parameters are indeterminate. Right Ventricle: The right ventricular size is moderately enlarged. No increase in right ventricular wall thickness. Right ventricular systolic function is mildly reduced. There is moderately elevated pulmonary artery systolic pressure. The tricuspid regurgitant velocity is 3.24 m/s, and with an assumed right atrial pressure of 15 mmHg, the estimated right ventricular systolic pressure is 57.0 mmHg. Left Atrium: Left atrial size was normal in size. Right Atrium: Right atrial size was mildly dilated. Pericardium: There is no evidence of pericardial effusion. Mitral Valve: The mitral valve is grossly normal. Mild mitral valve regurgitation. Tricuspid Valve: The tricuspid valve is grossly normal. Tricuspid valve regurgitation is mild. Aortic Valve: The aortic valve is tricuspid. Aortic valve regurgitation is not visualized.  Aortic valve mean gradient measures 1.0 mmHg. Aortic valve peak gradient measures 1.7 mmHg. Aortic valve area, by VTI measures 2.77 cm. Pulmonic Valve: The pulmonic valve was grossly normal. Pulmonic valve regurgitation is trivial. Aorta: The aortic root and ascending aorta are structurally normal, with no evidence of dilitation. Venous: The inferior vena cava is dilated in size with less than 50% respiratory variability, suggesting right atrial pressure of 15 mmHg. IAS/Shunts: No atrial level shunt detected by color flow Doppler.  LEFT VENTRICLE PLAX 2D LVIDd:         6.90 cm      Diastology LVIDs:         6.30 cm      LV e' medial:    24.40 cm/s LV PW:         1.30 cm      LV E/e'  medial:  4.5 LV IVS:        1.30 cm      LV e' lateral:   16.60 cm/s LVOT diam:     2.20 cm      LV E/e' lateral: 6.7 LV SV:         27 LV SV Index:   11 LVOT Area:     3.80 cm  LV Volumes (MOD) LV vol d, MOD A2C: 326.0 ml LV vol d, MOD A4C: 332.0 ml LV vol s, MOD A2C: 261.0 ml LV vol s, MOD A4C: 293.0 ml LV SV MOD A2C:     65.0 ml LV SV MOD A4C:     332.0 ml LV SV MOD BP:      48.5 ml RIGHT VENTRICLE             IVC RV Basal diam:  6.40 cm     IVC diam: 2.50 cm RV Mid diam:    4.10 cm RV S prime:     11.40 cm/s LEFT ATRIUM              Index        RIGHT ATRIUM           Index LA diam:        4.90 cm  2.02 cm/m   RA Area:     29.50 cm LA Vol (A2C):   83.6 ml  34.40 ml/m  RA Volume:   109.00 ml 44.85 ml/m LA Vol (A4C):   106.2 ml 43.68 ml/m LA Biplane Vol: 121.0 ml 49.79 ml/m  AORTIC VALVE                    PULMONIC VALVE AV Area (Vmax):    3.35 cm     PV Vmax:       0.64 m/s AV Area (Vmean):   3.32 cm     PV Peak grad:  1.6 mmHg AV Area (VTI):     2.77 cm AV Vmax:           64.60 cm/s AV Vmean:          46.200 cm/s AV VTI:            0.096 m AV Peak Grad:      1.7 mmHg AV Mean Grad:      1.0 mmHg LVOT Vmax:         56.90 cm/s LVOT Vmean:        40.400 cm/s LVOT VTI:          0.070 m LVOT/AV VTI ratio: 0.73  AORTA Ao Root  diam: 3.30 cm Ao Asc diam:  3.40 cm MITRAL VALVE                TRICUSPID VALVE MV Area (PHT): 6.12 cm     TR Peak grad:   42.0 mmHg MV Decel Time: 124 msec     TR Vmax:        324.00 cm/s MV E velocity: 111.00 cm/s                             SHUNTS                             Systemic VTI:  0.07 m  Systemic Diam: 2.20 cm Carolan ClinesMary Branch Electronically signed by Carolan ClinesMary Branch Signature Date/Time: 10/05/2021/12:06:15 PM    Final      Scheduled Meds:  digoxin  0.125 mg Oral Daily   enoxaparin (LOVENOX) injection  40 mg Subcutaneous Q24H   furosemide  80 mg Intravenous BID   mometasone-formoterol  2 puff Inhalation BID   sacubitril-valsartan  1 tablet Oral BID   sodium chloride flush  3 mL Intravenous Q12H   spironolactone  25 mg Oral Daily   Continuous Infusions:  sodium chloride     cefTRIAXone (ROCEPHIN)  IV 2 g (10/07/21 0831)     LOS: 2 days    Time spent: 30min  Zannie CovePreetha Dusty Raczkowski, MD [facility]   10/07/2021, 10:17 AM

## 2021-10-07 NOTE — Progress Notes (Signed)
Advanced Heart Failure Rounding Note   Subjective:    Diuresing well with IV lasix. Weight reportedly down 14 pounds overnight (20 pounds total).   Feels much better. Denies CP, SOB, orthopnea or PND.   Scr 1.04  On abx for ab wall celluilits  Objective:   Weight Range:  Vital Signs:   Temp:  [98 F (36.7 C)-98.1 F (36.7 C)] 98.1 F (36.7 C) (01/22 0536) Pulse Rate:  [80-101] 93 (01/22 0821) Resp:  [18-20] 20 (01/22 0821) BP: (113-121)/(77-85) 121/82 (01/22 0821) SpO2:  [93 %-100 %] 100 % (01/22 1143) Weight:  [109 kg] 109 kg (01/22 0536) Last BM Date: 10/03/21  Weight change: Filed Weights   10/05/21 0223 10/05/21 1350 10/07/21 0536  Weight: 118 kg 115.3 kg 109 kg    Intake/Output:   Intake/Output Summary (Last 24 hours) at 10/07/2021 1222 Last data filed at 10/07/2021 1143 Gross per 24 hour  Intake 697.33 ml  Output 3475 ml  Net -2777.67 ml      Physical Exam: General:  Sitting up in chair No resp difficulty HEENT: normal Neck: supple. JVP yo jaw Carotids 2+ bilat; no bruits. No lymphadenopathy or thryomegaly appreciated. Cor: PMI nondisplaced. Regular rate & rhythm. No rubs, gallops or murmurs. Lungs: clear Abdomen: soft, nontender, nondistended. No hepatosplenomegaly. No bruits or masses. Good bowel sounds. Cellulitis improving Extremities: no cyanosis, clubbing, rash, 2+ edema + TED Neuro: alert & orientedx3, cranial nerves grossly intact. moves all 4 extremities w/o difficulty. Affect pleasant   Telemetry: Sinus 80-90s with LBBB Personally reviewed  Labs: Basic Metabolic Panel: Recent Labs  Lab 10/04/21 2004 10/06/21 0401 10/07/21 0526  NA 137 135 135  K 4.6 3.7 4.6  CL 106 100 98  CO2 26 26 20*  GLUCOSE 121* 152* 108*  BUN 15 17 19   CREATININE 1.17 1.12 1.04  CALCIUM 8.4* 8.7* 9.1  MG  --  1.9 2.2     Liver Function Tests: No results for input(s): AST, ALT, ALKPHOS, BILITOT, PROT, ALBUMIN in the last 168 hours. No results for  input(s): LIPASE, AMYLASE in the last 168 hours. No results for input(s): AMMONIA in the last 168 hours.  CBC: Recent Labs  Lab 10/04/21 2004 10/07/21 0526  WBC 6.9 9.1  HGB 15.4 18.0*  HCT 49.8 55.6*  MCV 89.2 85.4  PLT 275 338     Cardiac Enzymes: No results for input(s): CKTOTAL, CKMB, CKMBINDEX, TROPONINI in the last 168 hours.  BNP: BNP (last 3 results) Recent Labs    10/04/21 2004  BNP 1,326.2*     ProBNP (last 3 results) No results for input(s): PROBNP in the last 8760 hours.    Other results:  Imaging: No results found.   Medications:     Scheduled Medications:  digoxin  0.125 mg Oral Daily   enoxaparin (LOVENOX) injection  40 mg Subcutaneous Q24H   furosemide  80 mg Intravenous BID   mometasone-formoterol  2 puff Inhalation BID   sacubitril-valsartan  1 tablet Oral BID   sodium chloride flush  3 mL Intravenous Q12H   spironolactone  25 mg Oral Daily    Infusions:  sodium chloride     cefTRIAXone (ROCEPHIN)  IV 2 g (10/07/21 0831)    PRN Medications: sodium chloride, albuterol, methocarbamol, ondansetron (ZOFRAN) IV, oxyCODONE, sodium chloride flush   Assessment/Plan:   1. Acute systolic CHF/presumed NICM: -Diagnosed February 2021 while admitted for HF in March 2021. EF 10% at that time -EF 15-20% on echo 09/22 at [facility] -  Echo 10/05/21 EF < 20%, RV moderate to severely reduced, mild MR, dilated IVC -Expect CM at least in part d/t LBBB -Now admit with a/c CHF after out of medications for over 2 months -Diuresing well with IV lasix -Hold beta blocker for now with a/c CHF -Continue spiro 25 mg daily -Continue entresto 24/26 BID -Continue digoxin 0.125  - Add Jardiance 10  - R/LHC on Tuesday -TED hose   2. LBBB: -likely contributing to cardiomyopathy -QRS < 150 ms -Syncopal episode/possible asystole 09/22. Brief CPR. CRT-D considered at that time, not done d/t social situation and ab wall infection  -Can reconsider down the line   3.  HTN: -BP improved. Titrate GDMT as tolerated    4. Polysubstance abuse: -Hx heroin abuse -UDS this admit positive for amphetamines - CSW on baord to assist with resources in community  5. Ab wall cellulitis - improved. continue abx per primary team    6. SDOH: - Homeless. Living in car. Has medicaid and disability - TOC CSW and HF clinic SW have seen and working on him with support  7. Polycythemia - suspect OSA - will need sleep study once he has a stable place to live  Length of Stay: 2   Arvilla Meres MD 10/07/2021, 12:22 PM  Advanced Heart Failure Team Pager [phone] (M-F; 7a - 4p)  Please contact [facility] for night-coverage after hours (4p -7a ) and weekends on [email or web address]

## 2021-10-08 ENCOUNTER — Other Ambulatory Visit (HOSPITAL_COMMUNITY): Payer: Self-pay

## 2021-10-08 DIAGNOSIS — I5023 Acute on chronic systolic (congestive) heart failure: Secondary | ICD-10-CM | POA: Diagnosis not present

## 2021-10-08 LAB — BASIC METABOLIC PANEL
Anion gap: 14 (ref 5–15)
BUN: 19 mg/dL (ref 6–20)
CO2: 22 mmol/L (ref 22–32)
Calcium: 9 mg/dL (ref 8.9–10.3)
Chloride: 100 mmol/L (ref 98–111)
Creatinine, Ser: 1.07 mg/dL (ref 0.61–1.24)
GFR, Estimated: >60 mL/min (ref >60)
Glucose, Bld: 89 mg/dL (ref 70–99)
Potassium: 4.7 mmol/L (ref 3.5–5.1)
Sodium: 136 mmol/L (ref 135–145)

## 2021-10-08 LAB — HEMOGLOBIN A1C
Hgb A1c MFr Bld: 6.6 % — ABNORMAL HIGH (ref 4.8–5.6)
Mean Plasma Glucose: 143 mg/dL

## 2021-10-08 MED ORDER — FUROSEMIDE 10 MG/ML IJ SOLN
80.0000 mg | Freq: Once | INTRAMUSCULAR | Status: AC
  Administered 2021-10-08: 80 mg via INTRAVENOUS
  Filled 2021-10-08: qty 8

## 2021-10-08 MED ORDER — TORSEMIDE 20 MG PO TABS: 40.0000 mg | ORAL_TABLET | Freq: Every day | ORAL | Status: DC

## 2021-10-08 MED ORDER — FUROSEMIDE 10 MG/ML IJ SOLN
80.0000 mg | Freq: Once | INTRAMUSCULAR | Status: AC
  Administered 2021-10-08: 80 mg via INTRAVENOUS

## 2021-10-08 NOTE — Progress Notes (Addendum)
Advanced Heart Failure Rounding Note   Subjective:    Continues to diurese well. 3.1L UOP yesterday.   Down 22 lb from admit.   BP stable.   Reports feeling nauseated this am. Had an episode of emesis.  Dyspnea and LE edema improved. Reports he has been ambulating halls.  He has decided he does not want to proceed with [facility]  Tired of lab draws and IV medications.   Objective:   Weight Range: 260 > 238 lb  Vital Signs:   Temp:  [98 F (36.7 C)-98.2 F (36.8 C)] 98.2 F (36.8 C) (01/23 0450) Pulse Rate:  [90-94] 90 (01/23 0450) Resp:  [19-20] 19 (01/23 0450) BP: (111-129)/(81-94) 128/84 (01/23 0450) SpO2:  [92 %-100 %] 93 % (01/23 0922) Weight:  [108.4 kg] 108.4 kg (01/23 0450) Last BM Date: 10/05/21  Weight change: Filed Weights   10/05/21 1350 10/07/21 0536 10/08/21 0450  Weight: 115.3 kg 109 kg 108.4 kg    Intake/Output:   Intake/Output Summary (Last 24 hours) at 10/08/2021 1012 Last data filed at 10/08/2021 0445 Gross per 24 hour  Intake 580 ml  Output 2850 ml  Net -2270 ml     Physical Exam: General:  No distress. Lying comfortably in bed. HEENT: normal Neck: supple. no JVD. Carotids 2+ bilat; no bruits. No lymphadenopathy or thryomegaly appreciated. Cor: PMI nondisplaced. Regular rate & rhythm. No rubs, gallops or murmurs. Lungs: clear Abdomen: soft, nontender, nondistended. Extremities: no cyanosis, clubbing, rash, 1+ edema Neuro: alert & orientedx3, cranial nerves grossly intact. moves all 4 extremities w/o difficulty. Affect pleasant    Telemetry: SR 80s-90s  Labs: Basic Metabolic Panel: Recent Labs  Lab 10/04/21 2004 10/06/21 0401 10/07/21 0526  NA 137 135 135  K 4.6 3.7 4.6  CL 106 100 98  CO2 26 26 20*  GLUCOSE 121* 152* 108*  BUN 15 17 19   CREATININE 1.17 1.12 1.04  CALCIUM 8.4* 8.7* 9.1  MG  --  1.9 2.2    Liver Function Tests: No results for input(s): AST, ALT, ALKPHOS, BILITOT, PROT, ALBUMIN in the last 168  hours. No results for input(s): LIPASE, AMYLASE in the last 168 hours. No results for input(s): AMMONIA in the last 168 hours.  CBC: Recent Labs  Lab 10/04/21 2004 10/07/21 0526  WBC 6.9 9.1  HGB 15.4 18.0*  HCT 49.8 55.6*  MCV 89.2 85.4  PLT 275 338    Cardiac Enzymes: No results for input(s): CKTOTAL, CKMB, CKMBINDEX, TROPONINI in the last 168 hours.  BNP: BNP (last 3 results) Recent Labs    10/04/21 2004  BNP 1,326.2*    ProBNP (last 3 results) No results for input(s): PROBNP in the last 8760 hours.    Other results:  Imaging: No results found.   Medications:     Scheduled Medications:  digoxin  0.125 mg Oral Daily   empagliflozin  10 mg Oral Daily   enoxaparin (LOVENOX) injection  40 mg Subcutaneous Q24H   furosemide  80 mg Intravenous BID   mometasone-formoterol  2 puff Inhalation BID   sacubitril-valsartan  1 tablet Oral BID   sodium chloride flush  3 mL Intravenous Q12H   spironolactone  25 mg Oral Daily    Infusions:  sodium chloride     cefTRIAXone (ROCEPHIN)  IV Stopped (10/07/21 0901)    PRN Medications: sodium chloride, albuterol, methocarbamol, ondansetron (ZOFRAN) IV, oxyCODONE, sodium chloride flush   Assessment/Plan:   1. Acute systolic CHF/presumed NICM: -Diagnosed February 2021 while admitted  for HF in [place]. EF 10% at that time -EF 15-20% on echo 09/22 at [facility] -Echo 10/05/21 EF < 20%, RV moderate to severely reduced, mild MR, dilated IVC -Expect CM at least in part d/t LBBB -Now admit with a/c CHF after out of medications for over 2 months -Diuresed well with IV lasix. Will give 1 more dose IV lasix then switch to po Torsemide 40 mg daily.  -Hold beta blocker for now with a/c CHF -Continue spiro 25 mg daily -Continue entresto 24/26 BID -Continue digoxin 0.125  -Continue Jardiance 10  - Scheduled for [facility] tomorrow. He has decided that does not want to proceed with any invasive workup at this time. -TED hose -Now agrees  to BMET this am.   2. LBBB: -likely contributing to cardiomyopathy -QRS < 150 ms -Syncopal episode/possible asystole 09/22. Brief CPR. CRT-D considered at that time, not done d/t social situation and ab wall infection  -Can reconsider down the line   3. HTN: -BP improved. Titrate GDMT as tolerated    4. Polysubstance abuse: -Hx heroin abuse -UDS this admit positive for amphetamines -CSW on baord to assist with resources in community  5. Ab wall cellulitis -improved. continue abx per primary team    6. SDOH: -Homeless. Living in car. Has medicaid and disability -TOC CSW and HF clinic SW have seen and working on him with support  7. Polycythemia -suspect OSA -will need sleep study once he has a stable place to live  Length of Stay: 3   FINCH, LINDSAY N MD 10/08/2021, 10:12 AM  Advanced Heart Failure Team Pager [phone] (M-F; 7a - 4p)  Please contact [facility] for night-coverage after hours (4p -7a ) and weekends on [email or web address]   Patient seen and examined with the above-signed Advanced Practice Provider and/or Housestaff. I personally reviewed laboratory data, imaging studies and relevant notes. I independently examined the patient and formulated the important aspects of the plan. I have edited the note to reflect any of my changes or salient points. I have personally discussed the plan with the patient and/or family.  Diuresing well. SOB improved. No labs this am  Had some nausea this am. Now improved.   Cellulitis improving  General: Sitting in chair . No resp difficulty HEENT: normal Neck: supple. no JVD. Carotids 2+ bilat; no bruits. No lymphadenopathy or thryomegaly appreciated. Cor: PMI nondisplaced. Regular rate & rhythm. No rubs, gallops or murmurs. Lungs: clear Abdomen: soft, nontender, nondistended. No hepatosplenomegaly. No bruits or masses. Good bowel sounds. Cellulitis improved Extremities: no cyanosis, clubbing, rash, 1+ edema Neuro: alert &  orientedx3, cranial nerves grossly intact. moves all 4 extremities w/o difficulty. Affect pleasant  Diuresing well. Still with volume on board. Would continue IV diuresis today.   He is not interested in R/L cath.   Hopefully we can get home tomorrow if social issues addressed.   Arvilla Meres, MD  11:49 AM

## 2021-10-08 NOTE — TOC Benefit Eligibility Note (Signed)
Patient Advocate Encounter  Prior Authorization for Jardiance 10 mg has been approved.    PA# [id number] Effective dates: 10/08/2021 through 10/08/2022  Patients co-pay is $4.00.     Roland Earl, CPhT Pharmacy Patient Advocate Specialist [facility] Patient Advocate Team Direct Number: [phone]  Fax: [phone]

## 2021-10-08 NOTE — TOC Benefit Eligibility Note (Signed)
Patient Advocate Encounter   Received notification that prior authorization for Jardiance 10 mg is required.   PA submitted on 10/08/2021 Key [id number] W Status is pending       Roland Earl, CPhT Pharmacy Patient Advocate Specialist [facility] Patient Advocate Team Direct Number: [phone]  Fax: [phone]

## 2021-10-08 NOTE — Progress Notes (Signed)
Patient continues to refuse cardiac catheterization so he will not sign consent form that is in signed and held orders.

## 2021-10-08 NOTE — Plan of Care (Signed)
°  Problem: Clinical Measurements: Goal: Respiratory complications will improve Outcome: Progressing   Problem: Pain Managment: Goal: General experience of comfort will improve Outcome: Progressing   Problem: Coping: Goal: Level of anxiety will decrease Outcome: Not Progressing  Pt. emotional

## 2021-10-08 NOTE — Progress Notes (Signed)
Pt. Refusing morning lab draws. States he will wanted labs drawn after breakfast, 8am.

## 2021-10-08 NOTE — TOC Progression Note (Addendum)
Transition of Care [facility]) - Progression Note    Patient Details  Name: Manoj Conde MRN: [id number] Date of Birth: [birth year]  Transition of Care [facility]) CM/SW Contact  Demari Kropp, LCSW Phone Number: 10/08/2021, 9:53 AM  Clinical Narrative:    9:53am - HF CSW reached out to [facility] [phone] to see about housing availability however nobody answered the phone and CSW had to leave a voicemail for them to return the call. CSW to continue the search for housing at time of discharge. 10:05am - CSW attempted to contact [facility] [phone] however was unsuccessful in speaking with someone and CSW will attempt outreach again. CSW attempted to call [facility] however several numbers are no longer working and was unable to locate someone to speak with. HF CSW spoke with Mr. Quincey at bedside and he reported that he is not planning on going anywhere and does not plan to leave AMA and that whoever said that "is a liar." 11:25am - HF CSW has continued to reach out to [place] however most numbers listed are not working numbers. 11:35am- HF CSW was able to get in touch with Alvis at [place] who has some vacancies available but will need to set up an interview with the patient and they charge $125 non-refundable fee and $125 weekly every Sunday or $500 a month. CSW set up a phone interview for tonight at 7pm and for Mr. Fadley to call [phone] tonight at 7pm for a 10-15 minute interview and Mr. Stormer is agreeable. 3:00pm - HF CSW spoke with Christophe Louis who does outreach for the [employer] and he reported that 2 locations have opennings available [place] [phone] and [place] [phone]. 3:04pm - HF CSW reached out to [facility] and is awaiting a response back as the CSW had to leave a voicemail for a return call.  CSW will continue to follow throughout discharge.       Barriers to Discharge: No Barriers Identified  Expected Discharge Plan and Services         Living arrangements for the past 2 months: Homeless                                       Social Determinants of Health (SDOH) Interventions Food Insecurity Interventions: Assist with SNAP Application Financial Strain Interventions: Other (Comment) (CSW is working on support.) Housing Interventions: Other (Comment) ([facility], group home, home search pending) Transportation Interventions: Intervention Not Indicated  Readmission Risk Interventions No flowsheet data found.

## 2021-10-08 NOTE — Progress Notes (Signed)
PROGRESS NOTE    Cameron Jenkins  [id number] DOB: Jan 08, 1976 DOA: 10/05/2021 PCP: Pcp, No  Brief Narrative:45/M with history of chronic systolic CHF, dilated cardiomyopathy EF of 15-20%, LBBB, hypertension, polysubstance abuse, COPD, history of cardiac arrest in 05/2021, homeless lives out of his car, presented to the ED 1/20 with shortness of breath, worsening lower extremity edema, ran out of all his meds 2 to 3 months ago, also has a rash on his abdomen, initially left AMA and then came back to the ED. -Improving with diuresis  Subjective: -Feels better overall, breathing is improving -Does not think he wants to go ahead with cath  Assessment & Plan:  Acute on chronic systolic CHF -Known dilated cardiomyopathy, EF of 15-20% -Improving well with diuresis, 11.8 L negative, weight down 22lbs -Continue Lasix 80 Mg every 12, also started on Entresto, Aldactone, digoxin -Creatinine stable, monitor I's and O's, weights, BMP in a.m. -Social work consulted, TOC following   Abdominal wall cellulitis -Continue ceftriaxone -Improving with decreased abdominal wall swelling as well   Hypertension -Improving with diuresis, above meds   History of polysubstance abuse -Denies IV drug use, counseled -UDS positive for amphetamines   Homelessness Depression -TOC consulted   Hyperglycemia -Reorder HbA1c  DVT prophylaxis: Lovenox Code Status: Full code Family Communication: No family at bedside Disposition Plan:  Inpatient  Consultants:  Cardiology  Procedures:    Objective: Vitals:   10/07/21 1956 10/07/21 2120 10/08/21 0450 10/08/21 0922  BP: 111/81  128/84   Pulse: 93  90   Resp:   19   Temp: 98 F (36.7 C)  98.2 F (36.8 C)   TempSrc: Oral  Oral   SpO2: 92% 92% 96% 93%  Weight:   108.4 kg   Height:        Intake/Output Summary (Last 24 hours) at 10/08/2021 0944 Last data filed at 10/08/2021 0445 Gross per 24 hour  Intake 580 ml  Output 2850 ml  Net -2270 ml    Filed Weights   10/05/21 1350 10/07/21 0536 10/08/21 0450  Weight: 115.3 kg 109 kg 108.4 kg    Examination:  Pleasant male sitting up in the recliner, AAOx3, no distress CVS: S1-S2, regular rate rhythm Lungs: Few basilar rales Abdomen: Soft, nontender, erythema and multiple discolored lesions on abdominal wall Extremities: 1-2+ edema, significantly improved Skin: As above  Psychiatry: Mood & affect appropriate.     Data Reviewed:   CBC: Recent Labs  Lab 10/04/21 2004 10/07/21 0526  WBC 6.9 9.1  HGB 15.4 18.0*  HCT 49.8 55.6*  MCV 89.2 85.4  PLT 275 338   Basic Metabolic Panel: Recent Labs  Lab 10/04/21 2004 10/06/21 0401 10/07/21 0526  NA 137 135 135  K 4.6 3.7 4.6  CL 106 100 98  CO2 26 26 20*  GLUCOSE 121* 152* 108*  BUN 15 17 19   CREATININE 1.17 1.12 1.04  CALCIUM 8.4* 8.7* 9.1  MG  --  1.9 2.2   GFR: Estimated Creatinine Clearance: 118.8 mL/min (by C-G formula based on SCr of 1.04 mg/dL). Liver Function Tests: No results for input(s): AST, ALT, ALKPHOS, BILITOT, PROT, ALBUMIN in the last 168 hours. No results for input(s): LIPASE, AMYLASE in the last 168 hours. No results for input(s): AMMONIA in the last 168 hours. Coagulation Profile: No results for input(s): INR, PROTIME in the last 168 hours. Cardiac Enzymes: No results for input(s): CKTOTAL, CKMB, CKMBINDEX, TROPONINI in the last 168 hours. BNP (last 3 results) No results for input(s): PROBNP  in the last 8760 hours. HbA1C: No results for input(s): HGBA1C in the last 72 hours. CBG: No results for input(s): GLUCAP in the last 168 hours. Lipid Profile: No results for input(s): CHOL, HDL, LDLCALC, TRIG, CHOLHDL, LDLDIRECT in the last 72 hours. Thyroid Function Tests: No results for input(s): TSH, T4TOTAL, FREET4, T3FREE, THYROIDAB in the last 72 hours. Anemia Panel: No results for input(s): VITAMINB12, FOLATE, FERRITIN, TIBC, IRON, RETICCTPCT in the last 72 hours. Urine analysis: No  results found for: COLORURINE, APPEARANCEUR, LABSPEC, PHURINE, GLUCOSEU, HGBUR, BILIRUBINUR, KETONESUR, PROTEINUR, UROBILINOGEN, NITRITE, LEUKOCYTESUR Sepsis Labs: @LABRCNTIP (procalcitonin:4,lacticidven:4)  ) Recent Results (from the past 240 hour(s))  Resp Panel by RT-PCR (Flu A&B, Covid) Nasopharyngeal Swab     Status: None   Collection Time: 10/04/21  7:37 PM   Specimen: Nasopharyngeal Swab; Nasopharyngeal(NP) swabs in vial transport medium  Result Value Ref Range Status   SARS Coronavirus 2 by RT PCR NEGATIVE NEGATIVE Final    Comment: (NOTE) SARS-CoV-2 target nucleic acids are NOT DETECTED.  The SARS-CoV-2 RNA is generally detectable in upper respiratory specimens during the acute phase of infection. The lowest concentration of SARS-CoV-2 viral copies this assay can detect is 138 copies/mL. A negative result does not preclude SARS-Cov-2 infection and should not be used as the sole basis for treatment or other patient management decisions. A negative result may occur with  improper specimen collection/handling, submission of specimen other than nasopharyngeal swab, presence of viral mutation(s) within the areas targeted by this assay, and inadequate number of viral copies(<138 copies/mL). A negative result must be combined with clinical observations, patient history, and epidemiological information. The expected result is Negative.  Fact Sheet for Patients:  10/06/21  Fact Sheet for Healthcare Providers:  BloggerCourse.com  This test is no t yet approved or cleared by the SeriousBroker.it FDA and  has been authorized for detection and/or diagnosis of SARS-CoV-2 by FDA under an Emergency Use Authorization (EUA). This EUA will remain  in effect (meaning this test can be used) for the duration of the COVID-19 declaration under Section 564(b)(1) of the Act, 21 U.S.C.section 360bbb-3(b)(1), unless the authorization is  terminated  or revoked sooner.       Influenza A by PCR NEGATIVE NEGATIVE Final   Influenza B by PCR NEGATIVE NEGATIVE Final    Comment: (NOTE) The Xpert Xpress SARS-CoV-2/FLU/RSV plus assay is intended as an aid in the diagnosis of influenza from Nasopharyngeal swab specimens and should not be used as a sole basis for treatment. Nasal washings and aspirates are unacceptable for Xpert Xpress SARS-CoV-2/FLU/RSV testing.  Fact Sheet for Patients: [place]  Fact Sheet for Healthcare Providers: BloggerCourse.com  This test is not yet approved or cleared by the SeriousBroker.it FDA and has been authorized for detection and/or diagnosis of SARS-CoV-2 by FDA under an Emergency Use Authorization (EUA). This EUA will remain in effect (meaning this test can be used) for the duration of the COVID-19 declaration under Section 564(b)(1) of the Act, 21 U.S.C. section 360bbb-3(b)(1), unless the authorization is terminated or revoked.  Performed at [facility], [address], [place], [place]      Radiology Studies: No results found.   Scheduled Meds:  digoxin  0.125 mg Oral Daily   empagliflozin  10 mg Oral Daily   enoxaparin (LOVENOX) injection  40 mg Subcutaneous Q24H   furosemide  80 mg Intravenous BID   mometasone-formoterol  2 puff Inhalation BID   sacubitril-valsartan  1 tablet Oral BID   sodium  chloride flush  3 mL Intravenous Q12H   spironolactone  25 mg Oral Daily   Continuous Infusions:  sodium chloride     cefTRIAXone (ROCEPHIN)  IV Stopped (10/07/21 0901)     LOS: 3 days    Time spent: 30min  Zannie CovePreetha Tenesha Garza, MD [facility]   10/08/2021, 9:44 AM

## 2021-10-08 NOTE — TOC Benefit Eligibility Note (Addendum)
Patient Product/process development scientist completed.    The patient is currently admitted and upon discharge could be taking Jardiance 10 mg.  Requires Prior Authorization  The patient is insured through [facility]     Roland Earl, CPhT Pharmacy Patient Advocate Specialist [facility] Patient Advocate Team Direct Number: [phone]  Fax: [phone]

## 2021-10-08 NOTE — TOC CM/SW Note (Addendum)
HF TOC CM spoke to pt and states he does not have PCP. Appt arranged with [facility] for 10/23/2021 at 9 am, new patient appt (appt will show on AVS). Will have meds come at dc from [facility] pharmacy. Pt agreeable to using [facility] for meds. Isidoro Donning RN3 CCM, Heart Failure TOC CM [phone]

## 2021-10-08 NOTE — Plan of Care (Signed)
°  Problem: Clinical Measurements: °Goal: Respiratory complications will improve °Outcome: Progressing °  °Problem: Pain Managment: °Goal: General experience of comfort will improve °Outcome: Progressing °  °Problem: Safety: °Goal: Ability to remain free from injury will improve °Outcome: Progressing °  °

## 2021-10-09 DIAGNOSIS — I5023 Acute on chronic systolic (congestive) heart failure: Secondary | ICD-10-CM | POA: Diagnosis not present

## 2021-10-09 DIAGNOSIS — L0390 Cellulitis, unspecified: Secondary | ICD-10-CM | POA: Diagnosis present

## 2021-10-09 DIAGNOSIS — E119 Type 2 diabetes mellitus without complications: Secondary | ICD-10-CM

## 2021-10-09 LAB — BASIC METABOLIC PANEL
Anion gap: 12 (ref 5–15)
BUN: 24 mg/dL — ABNORMAL HIGH (ref 6–20)
CO2: 27 mmol/L (ref 22–32)
Calcium: 9.3 mg/dL (ref 8.9–10.3)
Chloride: 96 mmol/L — ABNORMAL LOW (ref 98–111)
Creatinine, Ser: 1.21 mg/dL (ref 0.61–1.24)
GFR, Estimated: >60 mL/min (ref >60)
Glucose, Bld: 95 mg/dL (ref 70–99)
Potassium: 5 mmol/L (ref 3.5–5.1)
Sodium: 135 mmol/L (ref 135–145)

## 2021-10-09 LAB — HEPATIC FUNCTION PANEL
ALT: 24 U/L (ref 0–44)
AST: 43 U/L — ABNORMAL HIGH (ref 15–41)
Albumin: 3.7 g/dL (ref 3.5–5.0)
Alkaline Phosphatase: 162 U/L — ABNORMAL HIGH (ref 38–126)
Bilirubin, Direct: 0.6 mg/dL — ABNORMAL HIGH (ref 0.0–0.2)
Indirect Bilirubin: 1.1 mg/dL — ABNORMAL HIGH (ref 0.3–0.9)
Total Bilirubin: 1.7 mg/dL — ABNORMAL HIGH (ref 0.3–1.2)
Total Protein: 9.8 g/dL — ABNORMAL HIGH (ref 6.5–8.1)

## 2021-10-09 MED ORDER — CEPHALEXIN 250 MG PO CAPS: 250.0000 mg | ORAL_CAPSULE | Freq: Three times a day (TID) | ORAL | Status: DC

## 2021-10-09 MED ORDER — TORSEMIDE 20 MG PO TABS
40.0000 mg | ORAL_TABLET | Freq: Every day | ORAL | Status: DC
  Administered 2021-10-09: 14:00:00 40 mg via ORAL
  Filled 2021-10-09: qty 2

## 2021-10-09 NOTE — TOC Progression Note (Addendum)
Transition of Care [facility]) - Progression Note    Patient Details  Name: Cameron Jenkins MRN: [id number] Date of Birth: [birth year]  Transition of [facility]) CM/SW Contact  Elion Hocker, LCSW Phone Number: 10/09/2021, 12:08 PM  Clinical Narrative:    HF CSW spoke with [name] at bedside and asked if he was able to do the 7pm interview with [facility] last night and [name] reported that he has been sick, nauseous and throwing up and wasn't able to make the call.  11:15am - HF CSW and outpatient CSW Annice Pih spoke to [name] about other possible options inlcuding a room to rent for roughly $515 a month. [name] reported this to be too expensive. Annice Pih discussed about his expenses and if he would consider getting rid of his storage unit and [name] is agreeable but still thinks that anything $500+ is too much for what he can afford. HF CSW inpatient and outpatient still on the hunt for housing options.  HF CSW reached out to [facility] and they have a room available for $140 a week but need to do an in-person interview tonight at 6:30 or 7pm. CSW reported with [name] is currently in the hospital and that wouldn't work.   3:15pm - HF CSW and outpatient CSW Annice Pih spoke with [name] at bedside regarding the plan for discharge tomorrow. Brief discussion about prior history with [facility] and hospital treatment and his recovery. [name] reports that he has a friend Pricilla Riffle who lives in [place], [place] that he can stay with for 2 weeks he just needs gas money and food. Annice Pih will provide gift cards for gas and food and in the meantime both Annice Pih and I to reach out to [employer] to see about availability within the next 2 weeks for their transitional housing program.   CSW will continue to follow throughout discharge.     Barriers to Discharge: No Barriers Identified  Expected Discharge Plan and Services         Living arrangements for the past 2 months:  Homeless                                       Social Determinants of Health (SDOH) Interventions Food Insecurity Interventions: Assist with SNAP Application Financial Strain Interventions: Other (Comment) (CSW is working on support.) Housing Interventions: Other (Comment) ([facility], group home, home search pending) Transportation Interventions: Intervention Not Indicated  Readmission Risk Interventions No flowsheet data found.

## 2021-10-09 NOTE — Progress Notes (Signed)
Patient stated that he wanted to leave AMA. Patient stated that he was on a clear diet because he has vomited multiple time today.  Patient became verbally abusive, call this nurse multiple names. Patient told this nurse to get out of his room and don't come back.

## 2021-10-09 NOTE — Progress Notes (Signed)
Pt. Informed during shift report that he may be discharged in the am. Pt. Became upset and crying stating he doesn't feel well. States " I think I have the flu" and he feels like he's pouring sweat. Pt. VSS. Pt. States he threw up in the trash can, now requesting food. PRN Zofran administered. Pt. Remained emotional.

## 2021-10-09 NOTE — Progress Notes (Signed)
PROGRESS NOTE    Cameron Jenkins  [id number] DOB: 1975/11/12 DOA: 10/05/2021 PCP: Bo Merino I, NP  Brief Narrative:45/M with history of chronic systolic CHF, dilated cardiomyopathy EF of 15-20%, LBBB, hypertension, polysubstance abuse, COPD, history of cardiac arrest in 05/2021, homeless lives out of his car, presented to the ED 1/20 with shortness of breath, worsening lower extremity edema, ran out of all his meds 2 to 3 months ago, also has a rash on his abdomen, initially left AMA and then came back to the ED. -Improving with diuresis  Subjective: -Feels better overall, breathing is improving -Does not think he wants to go ahead with cath  Assessment & Plan:  Acute on chronic systolic CHF -Known dilated cardiomyopathy, EF of 15-20% -Improving well with diuresis, -14 L, weight down 25 pounds  -Switching to torsemide today  -Continue Entresto Aldactone and digoxin, kidney function is stable  -Discharge planning -TOC, heart failure Education officer, museum following for assistance with disposition, [facility] if possible   Abdominal wall cellulitis -Improving on IV ceftriaxone, will transition to oral Keflex for 3 more days   Hypertension -Improving with diuresis, above meds   History of polysubstance abuse -Denies IV drug use, counseled -UDS positive for amphetamines   Homelessness Depression -TOC following, patient being assisted with admission to [place]   Hyperglycemia New onset DM -Hba1c is 6.6, continue jardiance  DVT prophylaxis: Lovenox Code Status: Full code Family Communication: No family at bedside Disposition Plan:  Inpatient  Consultants:  Cardiology  Procedures:    Objective: Vitals:   10/08/21 0922 10/08/21 1949 10/08/21 2114 10/09/21 0656  BP:  118/86 118/78 (!) 124/91  Pulse:  92 (!) 103 90  Resp:  18 20 20   Temp:  98 F (36.7 C) 97.9 F (36.6 C) 98.5 F (36.9 C)  TempSrc:  Oral Oral Oral  SpO2: 93% 94% 98% 94%  Weight:    106.3 kg   Height:        Intake/Output Summary (Last 24 hours) at 10/09/2021 1212 Last data filed at 10/09/2021 [id number] Gross per 24 hour  Intake 1780 ml  Output 3100 ml  Net -1320 ml   Filed Weights   10/07/21 0536 10/08/21 0450 10/09/21 0656  Weight: 109 kg 108.4 kg 106.3 kg    Examination:  Pleasant male sitting up in recliner, AAOx3, no distress CVS: S1-S2, regular rate rhythm Lungs: Few basilar rales Abdomen: Soft, nontender, erythema and multiple discolored lesions in abdominal wall, improving Extremities: 1+ edema, considerably improved  Skin: As above  Psychiatry: Mood & affect appropriate.     Data Reviewed:   CBC: Recent Labs  Lab 10/04/21 2004 10/07/21 0526  WBC 6.9 9.1  HGB 15.4 18.0*  HCT 49.8 55.6*  MCV 89.2 85.4  PLT 275 [id number]   Basic Metabolic Panel: Recent Labs  Lab 10/04/21 2004 10/06/21 0401 10/07/21 0526 10/08/21 1053 10/09/21 0409  NA 137 135 135 136 135  K 4.6 3.7 4.6 4.7 5.0  CL 106 100 98 100 96*  CO2 26 26 20* 22 27  GLUCOSE 121* 152* 108* 89 95  BUN 15 17 19 19  24*  CREATININE 1.17 1.12 1.04 1.07 1.21  CALCIUM 8.4* 8.7* 9.1 9.0 9.3  MG  --  1.9 2.2  --   --    GFR: Estimated Creatinine Clearance: 101.2 mL/min (by C-G formula based on SCr of 1.21 mg/dL). Liver Function Tests: No results for input(s): AST, ALT, ALKPHOS, BILITOT, PROT, ALBUMIN in the last 168 hours. No results  for input(s): LIPASE, AMYLASE in the last 168 hours. No results for input(s): AMMONIA in the last 168 hours. Coagulation Profile: No results for input(s): INR, PROTIME in the last 168 hours. Cardiac Enzymes: No results for input(s): CKTOTAL, CKMB, CKMBINDEX, TROPONINI in the last 168 hours. BNP (last 3 results) No results for input(s): PROBNP in the last 8760 hours. HbA1C: Recent Labs    10/07/21 0526  HGBA1C 6.6*   CBG: No results for input(s): GLUCAP in the last 168 hours. Lipid Profile: No results for input(s): CHOL, HDL, LDLCALC, TRIG, CHOLHDL, LDLDIRECT  in the last 72 hours. Thyroid Function Tests: No results for input(s): TSH, T4TOTAL, FREET4, T3FREE, THYROIDAB in the last 72 hours. Anemia Panel: No results for input(s): VITAMINB12, FOLATE, FERRITIN, TIBC, IRON, RETICCTPCT in the last 72 hours. Urine analysis: No results found for: COLORURINE, APPEARANCEUR, LABSPEC, PHURINE, GLUCOSEU, HGBUR, BILIRUBINUR, KETONESUR, PROTEINUR, UROBILINOGEN, NITRITE, LEUKOCYTESUR Sepsis Labs: @LABRCNTIP (procalcitonin:4,lacticidven:4)  ) Recent Results (from the past 240 hour(s))  Resp Panel by RT-PCR (Flu A&B, Covid) Nasopharyngeal Swab     Status: None   Collection Time: 10/04/21  7:37 PM   Specimen: Nasopharyngeal Swab; Nasopharyngeal(NP) swabs in vial transport medium  Result Value Ref Range Status   SARS Coronavirus 2 by RT PCR NEGATIVE NEGATIVE Final    Comment: (NOTE) SARS-CoV-2 target nucleic acids are NOT DETECTED.  The SARS-CoV-2 RNA is generally detectable in upper respiratory specimens during the acute phase of infection. The lowest concentration of SARS-CoV-2 viral copies this assay can detect is 138 copies/mL. A negative result does not preclude SARS-Cov-2 infection and should not be used as the sole basis for treatment or other patient management decisions. A negative result may occur with  improper specimen collection/handling, submission of specimen other than nasopharyngeal swab, presence of viral mutation(s) within the areas targeted by this assay, and inadequate number of viral copies(<138 copies/mL). A negative result must be combined with clinical observations, patient history, and epidemiological information. The expected result is Negative.  Fact Sheet for Patients:  EntrepreneurPulse.com.au  Fact Sheet for Healthcare Providers:  IncredibleEmployment.be  This test is no t yet approved or cleared by the [place] FDA and  has been authorized for detection and/or diagnosis of  SARS-CoV-2 by FDA under an Emergency Use Authorization (EUA). This EUA will remain  in effect (meaning this test can be used) for the duration of the COVID-19 declaration under Section 564(b)(1) of the Act, 21 U.S.C.section 360bbb-3(b)(1), unless the authorization is terminated  or revoked sooner.       Influenza A by PCR NEGATIVE NEGATIVE Final   Influenza B by PCR NEGATIVE NEGATIVE Final    Comment: (NOTE) The Xpert Xpress SARS-CoV-2/FLU/RSV plus assay is intended as an aid in the diagnosis of influenza from Nasopharyngeal swab specimens and should not be used as a sole basis for treatment. Nasal washings and aspirates are unacceptable for Xpert Xpress SARS-CoV-2/FLU/RSV testing.  Fact Sheet for Patients: EntrepreneurPulse.com.au  Fact Sheet for Healthcare Providers: IncredibleEmployment.be  This test is not yet approved or cleared by the [place] FDA and has been authorized for detection and/or diagnosis of SARS-CoV-2 by FDA under an Emergency Use Authorization (EUA). This EUA will remain in effect (meaning this test can be used) for the duration of the COVID-19 declaration under Section 564(b)(1) of the Act, 21 U.S.C. section 360bbb-3(b)(1), unless the authorization is terminated or revoked.  Performed at [facility], [place] [address] [place], [place] [address]      Radiology Studies: No  results found.   Scheduled Meds:  cephALEXin  250 mg Oral Q8H   digoxin  0.125 mg Oral Daily   empagliflozin  10 mg Oral Daily   enoxaparin (LOVENOX) injection  40 mg Subcutaneous Q24H   mometasone-formoterol  2 puff Inhalation BID   sacubitril-valsartan  1 tablet Oral BID   sodium chloride flush  3 mL Intravenous Q12H   spironolactone  25 mg Oral Daily   torsemide  40 mg Oral Daily   Continuous Infusions:  sodium chloride       LOS: 4 days    Time spent: 85min  Domenic Polite, MD [facility]   10/09/2021, 12:12  PM

## 2021-10-09 NOTE — Progress Notes (Signed)
Overnight progress note  Notified by RN that patient left AMA because he was upset that his diet was changed to clear liquids due to nausea and vomiting.  I did not have the opportunity to speak to the patient as he already left the hospital before I was notified.

## 2021-10-09 NOTE — Progress Notes (Signed)
Patient vomiting in room. Patient stated he thought it was the medication causing him to vomit. Cardiology notified. Zofran administered.

## 2021-10-09 NOTE — Progress Notes (Addendum)
Advanced Heart Failure Rounding Note   Subjective:   Down 26 lb from admit.   Complaining of nausea. Unable to complete phone interview for possible housing.   Objective:   Weight Range: 260 > 234 lb  Vital Signs:   Temp:  [97.9 F (36.6 C)-98.5 F (36.9 C)] 98.5 F (36.9 C) (01/24 0656) Pulse Rate:  [90-103] 90 (01/24 0656) Resp:  [18-20] 20 (01/24 0656) BP: (118-124)/(78-91) 124/91 (01/24 0656) SpO2:  [94 %-98 %] 94 % (01/24 0656) Weight:  [106.3 kg] 106.3 kg (01/24 0656) Last BM Date: 10/06/21  Weight change: Filed Weights   10/07/21 0536 10/08/21 0450 10/09/21 0656  Weight: 109 kg 108.4 kg 106.3 kg    Intake/Output:   Intake/Output Summary (Last 24 hours) at 10/09/2021 1026 Last data filed at 10/09/2021 0926 Gross per 24 hour  Intake 1780 ml  Output 3100 ml  Net -1320 ml     Physical Exam: General:  Sitting in the chair.  HEENT: normal Neck: supple. no JVD. Carotids 2+ bilat; no bruits. No lymphadenopathy or thryomegaly appreciated. Cor: PMI nondisplaced. Regular rate & rhythm. No rubs, gallops or murmurs. Lungs: clear Abdomen: soft, nontender, nondistended. No hepatosplenomegaly. No bruits or masses. Good bowel sounds. Extremities: no cyanosis, clubbing, rash, edema Neuro: alert & orientedx3, cranial nerves grossly intact. moves all 4 extremities w/o difficulty. Affect flat.     Telemetry: SR 80s-90s  Labs: Basic Metabolic Panel: Recent Labs  Lab 10/04/21 2004 10/06/21 0401 10/07/21 0526 10/08/21 1053 10/09/21 0409  NA 137 135 135 136 135  K 4.6 3.7 4.6 4.7 5.0  CL 106 100 98 100 96*  CO2 26 26 20* 22 27  GLUCOSE 121* 152* 108* 89 95  BUN 15 17 19 19  24*  CREATININE 1.17 1.12 1.04 1.07 1.21  CALCIUM 8.4* 8.7* 9.1 9.0 9.3  MG  --  1.9 2.2  --   --     Liver Function Tests: No results for input(s): AST, ALT, ALKPHOS, BILITOT, PROT, ALBUMIN in the last 168 hours. No results for input(s): LIPASE, AMYLASE in the last 168 hours. No  results for input(s): AMMONIA in the last 168 hours.  CBC: Recent Labs  Lab 10/04/21 2004 10/07/21 0526  WBC 6.9 9.1  HGB 15.4 18.0*  HCT 49.8 55.6*  MCV 89.2 85.4  PLT 275 338    Cardiac Enzymes: No results for input(s): CKTOTAL, CKMB, CKMBINDEX, TROPONINI in the last 168 hours.  BNP: BNP (last 3 results) Recent Labs    10/04/21 2004  BNP 1,326.2*    ProBNP (last 3 results) No results for input(s): PROBNP in the last 8760 hours.    Other results:  Imaging: No results found.   Medications:     Scheduled Medications:  digoxin  0.125 mg Oral Daily   empagliflozin  10 mg Oral Daily   enoxaparin (LOVENOX) injection  40 mg Subcutaneous Q24H   mometasone-formoterol  2 puff Inhalation BID   sacubitril-valsartan  1 tablet Oral BID   sodium chloride flush  3 mL Intravenous Q12H   spironolactone  25 mg Oral Daily    Infusions:  sodium chloride     cefTRIAXone (ROCEPHIN)  IV 2 g (10/09/21 0926)    PRN Medications: sodium chloride, albuterol, methocarbamol, ondansetron (ZOFRAN) IV, oxyCODONE, sodium chloride flush   Assessment/Plan:   1. Acute systolic CHF/presumed NICM: -Diagnosed February 2021 while admitted for HF in March 2021. EF 10% at that time -EF 15-20% on echo 09/22 at [facility] -Echo 10/05/21  EF < 20%, RV moderate to severely reduced, mild MR, dilated IVC -Expect CM at least in part d/t LBBB -Now admit with a/c CHF after out of medications for over 2 months -Volume status improved. Overall weight down 26 pounds  -Switch to po Torsemide 40 mg daily.  -Hold beta blocker for now with a/c CHF -Continue spiro 25 mg daily -Continue entresto 24/26 BID -Continue digoxin 0.125  -Continue Jardiance 10  - Refuses cath  - Renal function stable.    2. LBBB: -likely contributing to cardiomyopathy -QRS < 150 ms -Syncopal episode/possible asystole 09/22. Brief CPR. CRT-D considered at that time, not done d/t social situation and ab wall infection  -Can  reconsider down the line   3. HTN: -Stable.  - Titrate GDMT as tolerated    4. Polysubstance abuse: -Hx heroin abuse -UDS this admit positive for amphetamines -CSW on board and assisting  with resources in community  5. Ab wall cellulitis -improved. continue abx per primary team    6. SDOH: -Homeless. Living in car. Has medicaid and disability -TOC CSW and HF clinic SW have seen and working on him with support  7. Polycythemia -suspect OSA -will need sleep study once he has a stable place to live  Length of Stay: 4   Amy Clegg NP-C  10/09/2021, 10:26 AM  Advanced Heart Failure Team Pager [phone] (M-F; 7a - 4p)  Please contact [facility] for night-coverage after hours (4p -7a ) and weekends on [email or web address]  Patient seen and examined with the above-signed Advanced Practice Provider and/or Housestaff. I personally reviewed laboratory data, imaging studies and relevant notes. I independently examined the patient and formulated the important aspects of the plan. I have edited the note to reflect any of my changes or salient points. I have personally discussed the plan with the patient and/or family.  Complaining of severe nausea. Thinks it is his abx. Has diuresed well. Weight down 26 pounds.  General:  Sitting in chair. No resp difficulty HEENT: normal Neck: supple. no JVD. Carotids 2+ bilat; no bruits. No lymphadenopathy or thryomegaly appreciated. Cor: PMI nondisplaced. Regular rate & rhythm. No rubs, gallops or murmurs. Lungs: clear Abdomen: soft, nontender, nondistended. No hepatosplenomegaly. No bruits or masses. Good bowel sounds. + cellulitis Extremities: no cyanosis, clubbing, rash, tr edema Neuro: alert & orientedx3, cranial nerves grossly intact. moves all 4 extremities w/o difficulty. Affect pleasant  Volume status much improved. Continues to refuse cath or other HF therapies. Abx changed. SW working on [employer].   Arvilla Meres, MD  1:51 PM

## 2021-10-09 NOTE — Progress Notes (Signed)
Pt. Left AMA after being told by day shift RN that his diet was changed to clear liquids due to his nausea and vomiting. Pt. Stated " the medicine makes me throw up". Pt. States he was not going to stay here all night and not be able to eat. AMA document completed, IV removed. On call for [facility] paged to make aware.

## 2021-10-09 NOTE — Progress Notes (Signed)
Patient verbalized that he vomited again. Patient stated that it was only liquid. This nurse was unable to visualized an emesis. Notified MD, orders received.

## 2021-10-10 ENCOUNTER — Other Ambulatory Visit: Payer: Self-pay

## 2021-10-10 ENCOUNTER — Ambulatory Visit (HOSPITAL_COMMUNITY)
Admission: RE | Admit: 2021-10-10 | Discharge: 2021-10-10 | Disposition: A | Discharge status: Home / Self Care | Payer: Medicaid Other | Source: Ambulatory Visit | Attending: Cardiology | Admitting: Cardiology

## 2021-10-10 ENCOUNTER — Other Ambulatory Visit (HOSPITAL_COMMUNITY): Payer: Self-pay | Admitting: Pharmacist

## 2021-10-10 ENCOUNTER — Other Ambulatory Visit (HOSPITAL_COMMUNITY): Payer: Self-pay

## 2021-10-10 VITALS — BP 120/78 | HR 112 | Resp 16

## 2021-10-10 DIAGNOSIS — I5021 Acute systolic (congestive) heart failure: Secondary | ICD-10-CM | POA: Insufficient documentation

## 2021-10-10 DIAGNOSIS — I5042 Chronic combined systolic (congestive) and diastolic (congestive) heart failure: Secondary | ICD-10-CM | POA: Diagnosis not present

## 2021-10-10 DIAGNOSIS — I447 Left bundle-branch block, unspecified: Secondary | ICD-10-CM | POA: Diagnosis not present

## 2021-10-10 MED ORDER — EMPAGLIFLOZIN 10 MG PO TABS
10.0000 mg | ORAL_TABLET | Freq: Every day | ORAL | 3 refills | Status: DC
  Filled 2021-10-10: qty 30, 30d supply, fill #0
  Filled 2022-01-07: qty 30, 30d supply, fill #1

## 2021-10-10 MED ORDER — SPIRONOLACTONE 25 MG PO TABS
25.0000 mg | ORAL_TABLET | Freq: Every day | ORAL | 3 refills | Status: DC
  Filled 2021-10-10: qty 30, 30d supply, fill #0
  Filled 2021-11-02 – 2022-01-07 (×2): qty 30, 30d supply, fill #1

## 2021-10-10 MED ORDER — DIGOXIN 125 MCG PO TABS
0.1250 mg | ORAL_TABLET | Freq: Every day | ORAL | 3 refills | Status: DC
  Filled 2021-10-10: qty 30, 30d supply, fill #0
  Filled 2022-01-07: qty 30, 30d supply, fill #1

## 2021-10-10 MED ORDER — ENTRESTO 24-26 MG PO TABS
1.0000 | ORAL_TABLET | Freq: Two times a day (BID) | ORAL | 3 refills | Status: DC
  Filled 2021-10-10: qty 60, 30d supply, fill #0
  Filled 2021-11-02 – 2022-01-07 (×2): qty 60, 30d supply, fill #1

## 2021-10-10 NOTE — Progress Notes (Signed)
CSW met with patient in the clinic. Patient states he left AMA and shared the events of last night with the staff. Patient stated "I knew as soon as I left that I shouldn't have done that". Patient appeared very frustrated with staff although acknowledged that he should have handled differently but "didn't like the way I was treated". Patient states he can still go to his friends home in [place] but will need gas and food money to get there. Patient reports that he has not eaten since yesterday and asked for some food. CSW assisted patient with gift cards for gas and food to stay at his friends home and gave him a meal from [place] during clinic visit. Patient verbalizes understanding of follow up and return to clinic next week. CSW will follow up with patient at that time. Raquel Sarna, [place], [place]

## 2021-10-10 NOTE — Patient Instructions (Signed)
Take Medications as Prescribed  Keep follow up appointment as scheduled 10/16/21  Do the following things EVERYDAY: Weigh yourself in the morning before breakfast. Write it down and keep it in a log. Take your medicines as prescribed Eat low salt foods--Limit salt (sodium) to 2000 mg per day.  Stay as active as you can everyday Limit all fluids for the day to less than 2 liters  If you have any questions or concerns before your next appointment please send [place] a message through [place] or call our office at [phone].    TO LEAVE A MESSAGE FOR THE NURSE SELECT OPTION 2, PLEASE LEAVE A MESSAGE INCLUDING: YOUR NAME DATE OF BIRTH CALL BACK NUMBER REASON FOR CALL**this is important as we prioritize the call backs  YOU WILL RECEIVE A CALL BACK THE SAME DAY AS LONG AS YOU CALL BEFORE 4:00 PM  At the [facility], you and your health needs are our priority. As part of our continuing mission to provide you with exceptional heart care, we have created designated Provider Care Teams. These Care Teams include your primary Cardiologist (physician) and Advanced Practice Providers (APPs- Physician Assistants and Nurse Practitioners) who all work together to provide you with the care you need, when you need it.   You may see any of the following providers on your designated Care Team at your next follow up: Dr Glori Bickers Dr Haynes Kerns, NP Lyda Jester, [place] [facility] [place], [place] Audry Riles, PharmD   Please be sure to bring in all your medications bottles to every appointment.

## 2021-10-10 NOTE — Progress Notes (Signed)
10/10/21 8:53AM - HF CSW received a call from Mr. Ogaz who reported that he left AMA last night because he was upset about how he was being treated by staff and having to be put on a clear liquid diet due to nausea and vomiting. Mr. Missel reported further comments were made by staff that he didn't appreciate and therefore left AMA. Mr. Gane doesn't have any of his medications, gas or money for food. CSW to speak with outpatient HF CSW, Kennyth Lose to see about a plan for Mr. Pacis.   Warda Mcqueary, MSW, [place] Heart Failure Social Worker

## 2021-10-10 NOTE — Addendum Note (Signed)
Encounter addended by: Marcy Siren, LCSW on: 10/10/2021 4:56 PM  Actions taken: Clinical Note Signed

## 2021-10-10 NOTE — Progress Notes (Signed)
[facility] Dr Haroldine Laws request pt come in to meet with CSW and RN. VS stable, HR elevated, EKG showed ST 101,pt states he is feeling better. CSW meeting with pt for SDOH needs. Will discuss w/Dr Bensimhon and pharmacy team what medications pt should be on.

## 2021-10-10 NOTE — Progress Notes (Signed)
Meds ordered per pharmacy team, picked up from [facility] pharmacy and provided to pt, reviewed AVS with him, he verbalized understanding, will take meds and will return next week for f/u appt

## 2021-10-16 ENCOUNTER — Telehealth (HOSPITAL_COMMUNITY): Payer: Self-pay | Admitting: Licensed Clinical Social Worker

## 2021-10-16 ENCOUNTER — Encounter (HOSPITAL_COMMUNITY): Payer: Medicaid Other

## 2021-10-16 NOTE — Telephone Encounter (Signed)
Patient was a no show for his clinic appointment today. CSW contacted patient via phone and unable to leave a message as voicemail has not been set up. Lasandra Beech, LCSW, CCSW-MCS [phone]

## 2021-10-16 NOTE — Progress Notes (Incomplete)
ADVANCED HF CLINIC CONSULT NOTE   Primary Care: Bo Merino I, NP HF Cardiologist: Dr. Haroldine Laws  HPI: Cameron Jenkins is a 46 y.o.male with history of chronic systolic CHF with EF [id number], LBBB, HTN, OSA, hx COPD, anxiety, prior tobacco use, hx noncompliance, hx polysubstance abuse.   Diagnosed with CHF while admitted in [place]. No records from admission available. There was mention of possible LVAD placement and was discharged with LifeVest. He lost insurance and was not seen for follow-up.   He established care with Dr. Agustin Cree in November 2021. Last seen in June 2022. Admitted to psych unit in September 2022 for detox from heroin. He had episode of syncope/? Possible asystole. Had very brief CPR. He was transferred to ICU at [facility]. He was seen by Cardiology. Echo with EF 15-20%. EP considered placement of CRT-D. Currently homeless and living in his vehicle. Defibrillator placement deferred until social situation more stable. Discharged home on carvedilol, spiro, valsartan and furosemide 40 daily. Did not follow-up with Cardiology after discharge.   Seen in ED at [facility] on 10/03/21 with chest tightness and dyspnea. Left before evaluated.  Admitted 1/23 with a/c CHF,out of meds x 2 months. UDS + amphetamines, ECG with ST and known LBBB. He was diuresed with IV lasix. Also with abdominal cellulitis and given IV Rocephin. Echo EF < 20%, RV moderate to severely reduced, mild MR, dilated IVC. He refused cath. Started on GDMT and ultimately left AMA, weight 234 lbs after 26 lb diuresis.   Today he returns for post hospital HF follow up. Overall feeling fine. Denies increasing SOB, CP, dizziness, edema, or PND/Orthopnea. Appetite ok. No fever or chills. Weight at home 170 pounds. Taking all medications.   Cardiac Studies: - Echo (9/22): EF 15-20% - Echo (1/23): EF < 20%, RV moderate to severely reduced, mild MR, dilated IVC    Review of Systems: [y] = yes, [ ]  = no   General: Weight gain  [ ] ; Weight loss [ ] ; Anorexia [ ] ; Fatigue [ ] ; Fever [ ] ; Chills [ ] ; Weakness [ ]   Cardiac: Chest pain/pressure [ ] ; Resting SOB [ ] ; Exertional SOB [ ] ; Orthopnea [ ] ; Pedal Edema [ ] ; Palpitations [ ] ; Syncope [ ] ; Presyncope [ ] ; Paroxysmal nocturnal dyspnea[ ]   Pulmonary: Cough [ ] ; Wheezing[ ] ; Hemoptysis[ ] ; Sputum [ ] ; Snoring [ ]   GI: Vomiting[ ] ; Dysphagia[ ] ; Melena[ ] ; Hematochezia [ ] ; Heartburn[ ] ; Abdominal pain [ ] ; Constipation [ ] ; Diarrhea [ ] ; BRBPR [ ]   GU: Hematuria[ ] ; Dysuria [ ] ; Nocturia[ ]   Vascular: Pain in legs with walking [ ] ; Pain in feet with lying flat [ ] ; Non-healing sores [ ] ; Stroke [ ] ; TIA [ ] ; Slurred speech [ ] ;  Neuro: Headaches[ ] ; Vertigo[ ] ; Seizures[ ] ; Paresthesias[ ] ;Blurred vision [ ] ; Diplopia [ ] ; Vision changes [ ]   Ortho/Skin: Arthritis [ ] ; Joint pain [ ] ; Muscle pain [ ] ; Joint swelling [ ] ; Back Pain [ ] ; Rash [ ]   Psych: Depression[ ] ; Anxiety[ ]   Heme: Bleeding problems [ ] ; Clotting disorders [ ] ; Anemia [ ]   Endocrine: Diabetes [ ] ; Thyroid dysfunction[ ]    Past Medical History:  Diagnosis Date   Acute on chronic systolic congestive heart failure ([place]) 05/28/2020   Formatting of this note might be different from the original. IMO update 2021   Chest pain 05/28/2020   Congestive heart failure (CHF) ([place]) 07/21/2020   COPD (chronic obstructive pulmonary disease) ([place])  Dilated cardiomyopathy ([place]) 07/21/2020   Dyspnea on exertion 07/21/2020   Essential hypertension 05/28/2020   Generalized anxiety disorder 05/28/2020   Heart failure (HCC)    LBBB (left bundle branch block) 05/28/2020   Nausea and vomiting 05/28/2020   Obstructive sleep apnea 07/21/2020   Pneumonia due to COVID-19 virus 05/28/2020   Formatting of this note might be different from the original. Diagnosed by SARS-CoV-2 PCR Cephid test at [facility] in [place], [place] on 05/28/2020.   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     Current  Outpatient Medications  Medication Sig Dispense Refill   albuterol (PROVENTIL) (5 MG/ML) 0.5% nebulizer solution Take 2.5 mg by nebulization every 6 (six) hours as needed for wheezing or shortness of breath.     budesonide-formoterol (SYMBICORT) 160-4.5 MCG/ACT inhaler Inhale 2 puffs into the lungs 2 (two) times daily.     digoxin (LANOXIN) 0.125 MG tablet Take 1 tablet (0.125 mg total) by mouth daily. 30 tablet 3   empagliflozin (JARDIANCE) 10 MG TABS tablet Take 1 tablet (10 mg total) by mouth daily before breakfast. 30 tablet 3   sacubitril-valsartan (ENTRESTO) 24-26 MG Take 1 tablet by mouth 2 (two) times daily. 60 tablet 3   spironolactone (ALDACTONE) 25 MG tablet Take 1 tablet (25 mg total) by mouth daily for 30 days. 90 tablet 3   No current facility-administered medications for this visit.   Allergies  Allergen Reactions   Acetaminophen     Pt reports nausea   Social History   Socioeconomic History   Marital status: Unknown    Spouse name: Not on file   Number of children: Not on file   Years of education: Not on file   Highest education level: Not on file  Occupational History   Not on file  Tobacco Use   Smoking status: Former    Types: Cigarettes    Quit date: 07/21/2013    Years since quitting: 8.2   Smokeless tobacco: Never  Substance and Sexual Activity   Alcohol use: Never   Drug use: Not Currently    Types: Marijuana   Sexual activity: Not on file  Other Topics Concern   Not on file  Social History Narrative   Not on file   Social Determinants of Health   Financial Resource Strain: High Risk   Difficulty of Paying Living Expenses: Hard  Food Insecurity: Food Insecurity Present   Worried About [place] in the Last Year: Sometimes true   Ran Out of Food in the Last Year: Sometimes true  Transportation Needs: No Transportation Needs   Lack of Transportation (Medical): No   Lack of Transportation (Non-Medical): No  Physical Activity: Not on  file  Stress: Not on file  Social Connections: Not on file  Intimate Partner Violence: Not on file   Family History  Problem Relation Age of Onset   COPD Mother    Heart disease Mother    Heart attack Father    Stroke Father    Bone cancer Maternal Grandmother    Stomach cancer Maternal Grandfather     There were no vitals filed for this visit.  PHYSICAL EXAM: General:  NAD. No resp difficulty HEENT: Normal Neck: Supple. No JVD. Carotids 2+ bilat; no bruits. No lymphadenopathy or thryomegaly appreciated. Cor: PMI nondisplaced. Regular rate & rhythm. No rubs, gallops or murmurs. Lungs: Clear Abdomen: Soft, nontender, nondistended. No hepatosplenomegaly. No bruits or masses. Good bowel sounds. Extremities: No  cyanosis, clubbing, rash, edema Neuro: Alert & oriented x 3, cranial nerves grossly intact. Moves all 4 extremities w/o difficulty. Affect pleasant.  ECG:  ASSESSMENT & PLAN:  1. Chronic systolic CHF/presumed NICM: -Diagnosed February 2021 while admitted for HF in [place]. EF 10% at that time -EF 15-20% on echo 09/22 at [facility] -Echo 10/05/21 EF < 20%, RV moderate to severely reduced, mild MR, dilated IVC -Expect CM at least in part d/t LBBB -Volume status improved. Overall weight down 26 pounds  - Continue Torsemide 40 mg daily.  - Hold beta blocker for now with a/c CHF - Continue spiro 25 mg daily - Continue Entresto 24/26 mg bid. - Continue digoxin 0.125 mg daily. - Continue Jardiance 10 mg daily. - Refuses cath  - BMET today.   2. LBBB: - likely contributing to cardiomyopathy - QRS < 150 ms - Syncopal episode/possible asystole 09/22. Brief CPR. CRT-D considered at that time, not done d/t social situation and ab wall infection  - Can reconsider down the line   3. HTN: - Stable.  - Titrate GDMT as tolerated    4. Polysubstance abuse: - Hx heroin abuse - UDS this admit positive for amphetamines - CSW on board and assisting  with resources in community    5. Ab wall cellulitis - Improved. continue abx per primary team    6. SDOH: - Homeless. Living in car. Has medicaid and disability - TOC CSW and HF clinic SW have seen and working on him with support   7. Polycythemia - suspect OSA - will need sleep study once he has a stable place to live   Follow up with APP in 3-4 weeks and 12 weeks with Dr. Haroldine Laws + echo.   Allena Katz, FNP-BC 10/16/21

## 2021-10-18 NOTE — Discharge Summary (Signed)
Physician Discharge Summary  Cameron HeadingsRobert Jenkins ZOX:[id number] DOB: 06-11-1976 DOA: 10/05/2021  PCP: Orion CrookPassmore, Tewana I, NP  Admit date: 10/05/2021   Recommendations for Outpatient Follow-up:  LEFT AMA overnight   Discharge Diagnoses:  Principal Problem:   Acute on chronic systolic congestive heart failure (HCC) Active Problems:   Dilated cardiomyopathy (HCC)   Essential hypertension   Homeless   Polysubstance abuse (HCC)   Opioid use disorder   History of cardiac arrest   Acute on chronic systolic CHF (congestive heart failure) (HCC)   Cellulitis   Type 2 diabetes mellitus without complication [facility](HCC)    Filed Weights   10/07/21 0536 10/08/21 0450 10/09/21 0656  Weight: 109 kg 108.4 kg 106.3 kg    History of present illness:  45/M with history of chronic systolic CHF, dilated cardiomyopathy EF of 15-20%, LBBB, hypertension, polysubstance abuse, COPD, history of cardiac arrest in 05/2021, homeless lives out of his car, presented to the ED 1/20 with shortness of breath, worsening lower extremity edema, ran out of all his meds 2 to 3 months ago, also has a rash on his abdomen, initially left AMA and then came back to the ED. -Improving with diuresis  Hospital Course:   Acute on chronic systolic CHF -Known dilated cardiomyopathy, EF of 15-20% -was Improving well with diuresis, -14 L, weight down 25 pounds  -Switched to torsemide  -Started on Entresto Aldactone and digoxin, kidney function is stable  -TOC, heart failure Child psychotherapistsocial worker was following for assistance with disposition, [facility] if possible, unfortunately patient left AMA at night  Nausea and vomiting -2 episodes of vomiting today, etiology is unclear, less likely to be antibiotic associated -Abdominal exam is benign, will check LFTs, added Zofran -This evening after second episode of vomiting his diet was downgraded to clears -Patient left AMA overnight following this   Abdominal wall cellulitis -Improving on IV  ceftriaxone, transitioned to oral Keflex for 3 more days   Hypertension -Improving with diuresis, above meds   History of polysubstance abuse -Denies IV drug use, counseled -UDS positive for amphetamines   Homelessness Depression -TOC following, patient being [facility]   Hyperglycemia New onset DM -Hba1c is 6.6, continue jardiance   Discharge Exam: Vitals:   10/08/21 2114 10/09/21 0656  BP: 118/78 (!) 124/91  Pulse: (!) 103 90  Resp: 20 20  Temp: 97.9 F (36.6 C) 98.5 F (36.9 C)  SpO2: 98% 94%   Left AMA   Discharge Instructions    Allergies as of 10/09/2021       Reactions   Acetaminophen    Pt reports nausea        Medication List     ASK your doctor about these medications    albuterol (5 MG/ML) 0.5% nebulizer solution Commonly known as: PROVENTIL Take 2.5 mg by nebulization every 6 (six) hours as needed for wheezing or shortness of breath.   budesonide-formoterol 160-4.5 MCG/ACT inhaler Commonly known as: SYMBICORT Inhale 2 puffs into the lungs 2 (two) times daily.       Allergies  Allergen Reactions   Acetaminophen     Pt reports nausea    Follow-up Information     [facility] Follow up on 10/16/2021.   Specialty: Cardiology Why: Advanced [facility] Failure [facility] 9:30 am Entrance C, Garage Code 1202 Contact information: [address] [id number] [place] [phone]  The results of significant diagnostics from this hospitalization (including imaging, microbiology, ancillary and laboratory) are listed below for reference.    Significant Diagnostic Studies: DG Chest 2 View  Result Date: 10/04/2021 CLINICAL DATA:  Shortness of breath EXAM: CHEST - 2 VIEW COMPARISON:  10/03/2021 FINDINGS: Stable cardiac enlargement. Could not exclude pericardial effusion. The mediastinal and hilar contours are  within normal limits. The lungs are clear. No infiltrates or effusions. No pulmonary lesions. No pneumothorax. The bony thorax is intact. IMPRESSION: 1. Stable cardiac enlargement. Could not exclude pericardial effusion. 2. No infiltrates or effusions. Electronically Signed   By: Rudie MeyerP.  Gallerani M.D.   On: 10/04/2021 20:25   ECHOCARDIOGRAM COMPLETE  Result Date: 10/05/2021    ECHOCARDIOGRAM REPORT   Patient Name:   Cameron HeadingsROBERT Jenkins Date of Exam: 10/05/2021 Medical Rec #:  [id number]     Height:       74.0 in Accession #:    [id number]    Weight:       260.1 lb Date of Birth:  22-Jun-1976     BSA:          2.430 m Patient Age:    45 years      BP:           131/100 mmHg Patient Gender: M             HR:           107 bpm. Exam Location:  Inpatient Procedure: 2D Echo, Cardiac Doppler, Color Doppler and Intracardiac            Opacification Agent Indications:    CHF  History:        Patient has no prior history of Echocardiogram examinations.                 Cardiomyopathy, COPD, Arrythmias:LBBB, Signs/Symptoms:Chest                 Pain; Risk Factors:Hypertension.  Sonographer:    Vanetta ShawlBrittney Adkins Referring Phys: [id number] JARED M GARDNER IMPRESSIONS  1. Left ventricular ejection fraction, by estimation, is <20%. The left ventricle has severely decreased function. The left ventricle demonstrates global hypokinesis. The left ventricular internal cavity size was severely dilated. Left ventricular diastolic parameters are indeterminate.  2. Right ventricular systolic function is mildly reduced. The right ventricular size is moderately enlarged. There is moderately elevated pulmonary artery systolic pressure.  3. Right atrial size was mildly dilated.  4. The mitral valve is grossly normal. Mild mitral valve regurgitation.  5. The aortic valve is tricuspid. Aortic valve regurgitation is not visualized.  6. The inferior vena cava is dilated in size with <50% respiratory variability, suggesting right atrial pressure of 15 mmHg.  Comparison(s): No prior Echocardiogram. FINDINGS  Left Ventricle: Left ventricular ejection fraction, by estimation, is <20%. The left ventricle has severely decreased function. The left ventricle demonstrates global hypokinesis. The left ventricular internal cavity size was severely dilated. Left ventricular diastolic parameters are indeterminate. Right Ventricle: The right ventricular size is moderately enlarged. No increase in right ventricular wall thickness. Right ventricular systolic function is mildly reduced. There is moderately elevated pulmonary artery systolic pressure. The tricuspid regurgitant velocity is 3.24 m/s, and with an assumed right atrial pressure of 15 mmHg, the estimated right ventricular systolic pressure is 57.0 mmHg. Left Atrium: Left atrial size was normal in size. Right Atrium: Right atrial size was mildly dilated. Pericardium: There is no evidence of pericardial effusion. Mitral Valve: The mitral valve is grossly normal.  Mild mitral valve regurgitation. Tricuspid Valve: The tricuspid valve is grossly normal. Tricuspid valve regurgitation is mild. Aortic Valve: The aortic valve is tricuspid. Aortic valve regurgitation is not visualized. Aortic valve mean gradient measures 1.0 mmHg. Aortic valve peak gradient measures 1.7 mmHg. Aortic valve area, by VTI measures 2.77 cm. Pulmonic Valve: The pulmonic valve was grossly normal. Pulmonic valve regurgitation is trivial. Aorta: The aortic root and ascending aorta are structurally normal, with no evidence of dilitation. Venous: The inferior vena cava is dilated in size with less than 50% respiratory variability, suggesting right atrial pressure of 15 mmHg. IAS/Shunts: No atrial level shunt detected by color flow Doppler.  LEFT VENTRICLE PLAX 2D LVIDd:         6.90 cm      Diastology LVIDs:         6.30 cm      LV e' medial:    24.40 cm/s LV PW:         1.30 cm      LV E/e' medial:  4.5 LV IVS:        1.30 cm      LV e' lateral:   16.60 cm/s  LVOT diam:     2.20 cm      LV E/e' lateral: 6.7 LV SV:         27 LV SV Index:   11 LVOT Area:     3.80 cm  LV Volumes (MOD) LV vol d, MOD A2C: 326.0 ml LV vol d, MOD A4C: 332.0 ml LV vol s, MOD A2C: 261.0 ml LV vol s, MOD A4C: 293.0 ml LV SV MOD A2C:     65.0 ml LV SV MOD A4C:     332.0 ml LV SV MOD BP:      48.5 ml RIGHT VENTRICLE             IVC RV Basal diam:  6.40 cm     IVC diam: 2.50 cm RV Mid diam:    4.10 cm RV S prime:     11.40 cm/s LEFT ATRIUM              Index        RIGHT ATRIUM           Index LA diam:        4.90 cm  2.02 cm/m   RA Area:     29.50 cm LA Vol (A2C):   83.6 ml  34.40 ml/m  RA Volume:   109.00 ml 44.85 ml/m LA Vol (A4C):   106.2 ml 43.68 ml/m LA Biplane Vol: 121.0 ml 49.79 ml/m  AORTIC VALVE                    PULMONIC VALVE AV Area (Vmax):    3.35 cm     PV Vmax:       0.64 m/s AV Area (Vmean):   3.32 cm     PV Peak grad:  1.6 mmHg AV Area (VTI):     2.77 cm AV Vmax:           64.60 cm/s AV Vmean:          46.200 cm/s AV VTI:            0.096 m AV Peak Grad:      1.7 mmHg AV Mean Grad:      1.0 mmHg LVOT Vmax:         56.90 cm/s LVOT Vmean:  40.400 cm/s LVOT VTI:          0.070 m LVOT/AV VTI ratio: 0.73  AORTA Ao Root diam: 3.30 cm Ao Asc diam:  3.40 cm MITRAL VALVE                TRICUSPID VALVE MV Area (PHT): 6.12 cm     TR Peak grad:   42.0 mmHg MV Decel Time: 124 msec     TR Vmax:        324.00 cm/s MV E velocity: 111.00 cm/s                             SHUNTS                             Systemic VTI:  0.07 m                             Systemic Diam: 2.20 cm Carolan Clines Electronically signed by Carolan Clines Signature Date/Time: 10/05/2021/12:06:15 PM    Final     Microbiology: No results found for this or any previous visit (from the past 240 hour(s)).   Labs: Basic Metabolic Panel: No results for input(s): NA, K, CL, CO2, GLUCOSE, BUN, CREATININE, CALCIUM, MG, PHOS in the last 168 hours. Liver Function Tests: No results for input(s): AST, ALT, ALKPHOS,  BILITOT, PROT, ALBUMIN in the last 168 hours. No results for input(s): LIPASE, AMYLASE in the last 168 hours. No results for input(s): AMMONIA in the last 168 hours. CBC: No results for input(s): WBC, NEUTROABS, HGB, HCT, MCV, PLT in the last 168 hours. Cardiac Enzymes: No results for input(s): CKTOTAL, CKMB, CKMBINDEX, TROPONINI in the last 168 hours. BNP: BNP (last 3 results) Recent Labs    10/04/21 2004  BNP 1,326.2*    ProBNP (last 3 results) No results for input(s): PROBNP in the last 8760 hours.  CBG: No results for input(s): GLUCAP in the last 168 hours.     Signed:  Zannie Cove MD.  [facility] 10/18/2021, 2:55 PM

## 2021-10-23 ENCOUNTER — Other Ambulatory Visit (HOSPITAL_COMMUNITY): Payer: Self-pay

## 2021-10-23 ENCOUNTER — Ambulatory Visit (INDEPENDENT_AMBULATORY_CARE_PROVIDER_SITE_OTHER): Payer: Medicaid Other | Admitting: Nurse Practitioner

## 2021-10-23 ENCOUNTER — Telehealth (HOSPITAL_COMMUNITY): Payer: Self-pay | Admitting: Licensed Clinical Social Worker

## 2021-10-23 ENCOUNTER — Other Ambulatory Visit: Payer: Self-pay

## 2021-10-23 ENCOUNTER — Encounter: Payer: Self-pay | Admitting: Nurse Practitioner

## 2021-10-23 VITALS — BP 126/85 | HR 102 | Temp 98.0°F | Ht 72.0 in | Wt 251.0 lb

## 2021-10-23 DIAGNOSIS — I10 Essential (primary) hypertension: Secondary | ICD-10-CM | POA: Diagnosis not present

## 2021-10-23 DIAGNOSIS — E119 Type 2 diabetes mellitus without complications: Secondary | ICD-10-CM

## 2021-10-23 DIAGNOSIS — I5042 Chronic combined systolic (congestive) and diastolic (congestive) heart failure: Secondary | ICD-10-CM

## 2021-10-23 DIAGNOSIS — G4709 Other insomnia: Secondary | ICD-10-CM

## 2021-10-23 DIAGNOSIS — F1910 Other psychoactive substance abuse, uncomplicated: Secondary | ICD-10-CM

## 2021-10-23 DIAGNOSIS — Z5900 Homelessness unspecified: Secondary | ICD-10-CM

## 2021-10-23 DIAGNOSIS — R21 Rash and other nonspecific skin eruption: Secondary | ICD-10-CM

## 2021-10-23 LAB — POCT GLYCOSYLATED HEMOGLOBIN (HGB A1C)
HbA1c POC (<> result, manual entry): 6.2 % (ref 4.0–5.6)
HbA1c, POC (controlled diabetic range): 6.2 % (ref 0.0–7.0)
HbA1c, POC (prediabetic range): 6.2 % (ref 5.7–6.4)
Hemoglobin A1C: 6.2 % — AB (ref 4.0–5.6)

## 2021-10-23 LAB — GLUCOSE, POCT (MANUAL RESULT ENTRY): POC Glucose: 203 mg/dl — AB (ref 70–99)

## 2021-10-23 MED ORDER — FUROSEMIDE 20 MG PO TABS
40.0000 mg | ORAL_TABLET | Freq: Two times a day (BID) | ORAL | 2 refills | Status: DC
  Filled 2021-10-23: qty 120, 30d supply, fill #0
  Filled 2022-01-07: qty 120, 30d supply, fill #1

## 2021-10-23 MED ORDER — TRIAMCINOLONE ACETONIDE 0.1 % EX CREA
1.0000 "application " | TOPICAL_CREAM | Freq: Two times a day (BID) | CUTANEOUS | 0 refills | Status: DC
  Filled 2021-10-23: qty 30, 15d supply, fill #0

## 2021-10-23 NOTE — Patient Instructions (Signed)
You were seen today in the [facility] to establish care and for hospital follow up.You were prescribed medications, please take as directed. Please follow up in 1 mth for reevaluation CHF and rash.

## 2021-10-23 NOTE — Telephone Encounter (Signed)
CSW consulted to speak with pt about current housing and transportation concerns.  Attempted to call to discuss- unable to reach and unable to leave VM- texted pt phone requesting return call  Burna Sis, LCSW Clinical Social Worker [facility] Desk#: [phone] Cell#: [phone]

## 2021-10-23 NOTE — Progress Notes (Signed)
Integrated Behavioral Health Case Management Referral Note  10/23/2021 Name: Cameron Jenkins MRN: [id number] DOB: 08-26-1976 Cameron Jenkins is a 46 y.o. year old male who sees Passmore, Jake Church I, NP for primary care. LCSW was consulted to assess patient's needs and assist the patient with  housing barriers, low income, and food insecurity .  Interpreter: No.   Interpreter Name & Language: none  Assessment: Patient experiencing  housing barriers, low income, and food insecurity .   Intervention: CSW met with patient during PCP visit. Upon chart review, noted that heart failure social worker has been working with patient on these problems. Coordinated with heart failure social worker for assistance on follow up.   Review of patient status, including review of consultants reports, relevant laboratory and other test results, and collaboration with appropriate care team members and the patient's provider was performed as part of comprehensive patient evaluation and provision of services.    Estanislado Emms, [facility] [phone]

## 2021-10-23 NOTE — Progress Notes (Signed)
[facility] [address] [place], [place]  [id number] Phone:  [phone]   Fax:  [phone] Subjective:   Patient ID: Cameron HeadingsRobert Jenkins, male    DOB: 23-Sep-1975, 46 y.o.   MRN: [id number]  Chief Complaint  Patient presents with   Establish Care    Pt is here today to establish care and to see what he needs to do to stay healthy and manage his medical conditions. Pt states that he was in the hospital recently due to congestive heart failure. Pt is terrified of needles and doesn't want to get blood work done.   HPI Cameron HeadingsRobert Jenkins 46 y.o. male  has a past medical history of Acute on chronic systolic congestive heart failure (HCC) (05/28/2020), Chest pain (05/28/2020), Congestive heart failure (CHF) (HCC) (07/21/2020), COPD (chronic obstructive pulmonary disease) (HCC), Dilated cardiomyopathy (HCC) (07/21/2020), Dyspnea on exertion (07/21/2020), Essential hypertension (05/28/2020), Generalized anxiety disorder (05/28/2020), Heart failure (HCC), LBBB (left bundle branch block) (05/28/2020), Nausea and vomiting (05/28/2020), Obstructive sleep apnea (07/21/2020), Pneumonia due to COVID-19 virus (05/28/2020), and Significant birth injury of newborn. [facility]   States that he was admitted to the hospital for 2 wks, earlier this month for congestive heart failure. Since being discharge he has been compliant with medications. States that he continues to have difficulty breathing and feels like he is beginning to retain fluid again. Is beginning to have swelling in BLE and abdomen.   Also concerned about rash to left hip, noted 1.5 wks ago, compares to severely dry skin. Denies any itching, only pain with palpation. Had similar rash on stomach a few years ago related to tics, but does not suspect the rash on hip is related to insect bite. Has been taking prescribed medication, including antibiotics, without any improvement.  States that he left hospital AMA due  to rudeness of nursing staff. When questioned about living situation, states that he is currently living in his car. States that he was unaware of [facility]. Unemployed due to shortness of breath related to chronic illness. 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 Endorses some drug usage to assist with back pain. Currently abuses meth, heroine and pain pills, every other day for pain management and to help with insomnia. States that he fell off a tractor trailer several years ago severely hurting back.   Has family in the area, but has not seen them in 3-4 yrs [place]. Denies any IV drug usage due to fear of needles. Denies any other complaints today.   Denies any fever. Denies any fatigue, chest pain, shortness of breath, HA or dizziness. Denies any blurred vision, numbness or tingling.  Past Medical History:  Diagnosis Date   Acute on chronic systolic congestive heart failure (HCC) 05/28/2020   Formatting of this note might be different from the original. IMO update 2021   Chest pain 05/28/2020   Congestive heart failure (CHF) (HCC) 07/21/2020   COPD (chronic obstructive pulmonary disease) (HCC)    Dilated cardiomyopathy (HCC) 07/21/2020   Dyspnea on exertion 07/21/2020   Essential hypertension 05/28/2020   Generalized anxiety disorder 05/28/2020   Heart failure (HCC)    LBBB (left bundle branch block) 05/28/2020   Nausea and vomiting 05/28/2020   Obstructive sleep apnea 07/21/2020   Pneumonia due to COVID-19 virus 05/28/2020   Formatting of this note might be different from the original. Diagnosed by  SARS-CoV-2 PCR Cephid test at [facility] in [place], [place] on 05/28/2020.   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     Past Surgical History:  Procedure Laterality Date   NO PAST SURGERIES      Family History  Problem Relation Age of Onset   COPD Mother    Heart disease  Mother    Heart attack Father    Stroke Father    Bone cancer Maternal Grandmother    Stomach cancer Maternal Grandfather     Social History   Socioeconomic History   Marital status: Unknown    Spouse name: Not on file   Number of children: Not on file   Years of education: Not on file   Highest education level: Not on file  Occupational History   Not on file  Tobacco Use   Smoking status: Former    Types: Cigarettes    Quit date: 07/21/2013    Years since quitting: 8.2   Smokeless tobacco: Never  Vaping Use   Vaping Use: Never used  Substance and Sexual Activity   Alcohol use: Never   Drug use: Not Currently    Types: Marijuana   Sexual activity: Not Currently  Other Topics Concern   Not on file  Social History Narrative   Not on file   Social Determinants of Health   Financial Resource Strain: High Risk   Difficulty of Paying Living Expenses: Hard  Food Insecurity: Food Insecurity Present   Worried About Running Out of Food in the Last Year: Sometimes true   Ran Out of Food in the Last Year: Sometimes true  Transportation Needs: Unmet Transportation Needs   Lack of Transportation (Medical): Yes   Lack of Transportation (Non-Medical): No  Physical Activity: Not on file  Stress: Not on file  Social Connections: Not on file  Intimate Partner Violence: Not on file    Outpatient Medications Prior to Visit  Medication Sig Dispense Refill   albuterol (PROVENTIL) (5 MG/ML) 0.5% nebulizer solution Take 2.5 mg by nebulization every 6 (six) hours as needed for wheezing or shortness of breath.     budesonide-formoterol (SYMBICORT) 160-4.5 MCG/ACT inhaler Inhale 2 puffs into the lungs 2 (two) times daily.     digoxin (LANOXIN) 0.125 MG tablet Take 1 tablet (0.125 mg total) by mouth daily. 30 tablet 3   empagliflozin (JARDIANCE) 10 MG TABS tablet Take 1 tablet (10 mg total) by mouth daily before breakfast. 30 tablet 3   sacubitril-valsartan (ENTRESTO) 24-26 MG Take 1  tablet by mouth 2 (two) times daily. 60 tablet 3   spironolactone (ALDACTONE) 25 MG tablet Take 1 tablet (25 mg total) by mouth daily for 30 days. 90 tablet 3   No facility-administered medications prior to visit.    Allergies  Allergen Reactions   Acetaminophen     Pt reports nausea    Review of Systems  Constitutional:  Negative for chills, fever and malaise/fatigue.  HENT: Negative.    Eyes: Negative.   Respiratory:  Positive for shortness of breath. Negative for cough.   Cardiovascular:  Positive for leg swelling. Negative for chest pain and palpitations.  Gastrointestinal:  Negative for abdominal pain, blood in stool, constipation, diarrhea, nausea and vomiting.  Genitourinary: Negative.   Musculoskeletal: Negative.   Skin:  Positive for rash.  Neurological: Negative.   Psychiatric/Behavioral:  Negative for depression. The patient has insomnia. The patient is not nervous/anxious.   All other systems reviewed and are  negative.     Objective:    Physical Exam Vitals reviewed.  Constitutional:      General: He is not in acute distress.    Appearance: Normal appearance. He is obese.  HENT:     Head: Normocephalic.     Right Ear: Tympanic membrane, ear canal and external ear normal. There is no impacted cerumen.     Left Ear: Tympanic membrane, ear canal and external ear normal. There is no impacted cerumen.     Nose: Nose normal. No congestion or rhinorrhea.     Mouth/Throat:     Mouth: Mucous membranes are moist.     Pharynx: Oropharynx is clear. No oropharyngeal exudate or posterior oropharyngeal erythema.  Eyes:     General: No scleral icterus.       Right eye: No discharge.        Left eye: No discharge.     Extraocular Movements: Extraocular movements intact.     Conjunctiva/sclera: Conjunctivae normal.     Pupils: Pupils are equal, round, and reactive to light.  Neck:     Vascular: No carotid bruit.  Cardiovascular:     Rate and Rhythm: Normal rate and  regular rhythm.     Pulses: Normal pulses.     Heart sounds: Normal heart sounds.  Pulmonary:     Effort: Pulmonary effort is normal.     Breath sounds: Normal breath sounds.  Abdominal:     General: Abdomen is flat. Bowel sounds are normal. There is no distension.     Palpations: Abdomen is soft. There is no mass.     Tenderness: There is no abdominal tenderness. There is no right CVA tenderness, left CVA tenderness, guarding or rebound.     Hernia: No hernia is present.  Musculoskeletal:        General: Swelling present. No tenderness, deformity or signs of injury. Normal range of motion.     Cervical back: Normal range of motion and neck supple. No rigidity or tenderness.     Right lower leg: Edema present.     Left lower leg: Edema present.  Lymphadenopathy:     Cervical: No cervical adenopathy.  Skin:    General: Skin is warm and dry.     Capillary Refill: Capillary refill takes less than 2 seconds.     Coloration: Skin is not jaundiced or pale.     Findings: Bruising, erythema, lesion and rash present. Rash is macular and papular. Rash is not crusting, nodular, purpuric, pustular, scaling, urticarial or vesicular.     Comments: Diffuse lesions and contusions to BUE and lower abdomen  Neurological:     General: No focal deficit present.     Mental Status: He is alert and oriented to person, place, and time.  Psychiatric:        Mood and Affect: Mood normal.        Behavior: Behavior normal.        Thought Content: Thought content normal.        Judgment: Judgment normal.    BP 126/85    Pulse (!) 102    Temp 98 F (36.7 C)    Ht 6' (1.829 m)    Wt 251 lb (113.9 kg)    SpO2 98%    BMI 34.04 kg/m  Wt Readings from Last 3 Encounters:  10/23/21 251 lb (113.9 kg)  10/09/21 234 lb 6.4 oz (106.3 kg)  10/04/21 261 lb (118.4 kg)     There is no  immunization history on file for this patient.  Diabetic Foot Exam - Simple   No data filed     No results found for: TSH Lab  Results  Component Value Date   WBC 9.1 10/07/2021   HGB 18.0 (H) 10/07/2021   HCT 55.6 (H) 10/07/2021   MCV 85.4 10/07/2021   PLT 338 10/07/2021   Lab Results  Component Value Date   NA 135 10/09/2021   K 5.0 10/09/2021   CO2 27 10/09/2021   GLUCOSE 95 10/09/2021   BUN 24 (H) 10/09/2021   CREATININE 1.21 10/09/2021   BILITOT 1.7 (H) 10/09/2021   ALKPHOS 162 (H) 10/09/2021   AST 43 (H) 10/09/2021   ALT 24 10/09/2021   PROT 9.8 (H) 10/09/2021   ALBUMIN 3.7 10/09/2021   CALCIUM 9.3 10/09/2021   ANIONGAP 12 10/09/2021   No results found for: CHOL No results found for: HDL No results found for: LDLCALC No results found for: TRIG No results found for: CHOLHDL Lab Results  Component Value Date   HGBA1C 6.2 (A) 10/23/2021   HGBA1C 6.2 10/23/2021   HGBA1C 6.2 10/23/2021   HGBA1C 6.2 10/23/2021       Assessment & Plan:   Problem List Items Addressed This Visit       Cardiovascular and Mediastinum   Essential hypertension - Primary (Chronic)   Relevant Medications   furosemide (LASIX) 20 MG tablet, dosage increased    Other Relevant Orders   POCT URINALYSIS DIP (CLINITEK)   Congestive heart failure (CHF) (HCC)   Relevant Medications   furosemide (LASIX) 20 MG tablet, dosage increased     Endocrine   Type 2 diabetes mellitus without complication (HCC)   Relevant Orders   HgB A1c (Completed): 6.2, improved since inpatient stay    Glucose (CBG) (Completed): 203   POCT URINALYSIS DIP (CLINITEK) Encouraged continued diet and exercise efforts  Encouraged continued compliance with medication       Other   Homeless (Chronic) Referred to in clinic LCSW for resources, evaluation completed at the end of visit   Other Visit Diagnoses     Rash       Relevant Medications   triamcinolone cream (KENALOG) 0.1 % Discussed non pharmacological methods for management of symptoms   Substance abuse (HCC)     Discussed the importance of abstinence  Discouraged continued drug  use   Other insomnia     Discussed non pharmacological methods for management of symptoms   Follow up in 1 mth for reevaluation of CHF and rash, sooner as needed     I am having Cameron Jenkins start on triamcinolone cream and furosemide. I am also having him maintain his albuterol, budesonide-formoterol, Entresto, digoxin, empagliflozin, and spironolactone.  Meds ordered this encounter  Medications   triamcinolone cream (KENALOG) 0.1 %    Sig: Apply 1 application topically 2 (two) times daily.    Dispense:  30 g    Refill:  0   furosemide (LASIX) 20 MG tablet    Sig: Take 2 tablets (40 mg total) by mouth 2 (two) times daily.    Dispense:  120 tablet    Refill:  2     Kathrynn Speed, NP

## 2021-10-24 NOTE — Progress Notes (Signed)
Heart and Vascular Care Navigation  10/24/2021  Cameron Jenkins Aug 06, 1976 [id number]  Reason for Referral: Pt referred for concerns with transportation and lack of housing.   Engaged with patient by telephone for follow up visit for Heart and Vascular Care Coordination.                                                                                                   Assessment: CSW called pt to discuss above concerns.  Pt missed appt last week due to lack of transportation.  Thought someone had set up [facility] for pt but pt is not in [employer] system and he has no stable address for [place] to sent pick up to.  Pt reports he has a car but just can't afford gas.  Discussed that we could provide $25 gift card to him on Monday if he can make it to appt to help with gas.  Also mentioned he could call me the morning of the appt and we could set up [facility] if he knew the address he would be located at.  Also mentioned that he could use Medicaid transport but would need to know his address that he would be located at several days in advance in order to set up ride.  Pt had also inquired about getting another interview with [facility].  Explained that he would just need to call them to set up interview himself as they would need to complete assessment with him directly.  He still has the numbers that he was provided in the hospital so encouraged him to reach out.  Also informed him we could provide list of current [place] vacancies when he comes on Monday.  He is currently living in his car.  Is familiar with the [facility] and goes there for showers.  Encouraged him to go there and meet with someone to complete VISDAT assessment and get on list for care managemnet services.  Pt has income so could pursue housing but only gets $914 a month through SSI so with other expenses (food, storage unit rent, etc) he does not have a lot left over for housing.                                     HRT/VAS  Care Coordination     Living arrangements for the past 2 months Homeless   Lives with: Self   Patient Current Insurance Coverage Medicaid   Patient Has Concern With Paying Medical Bills No   Does Patient Have Prescription Coverage? Yes   Home Assistive Devices/Equipment None       Social History:  SDOH Screenings   Alcohol Screen: Not on file  Depression (PHQ2-9): Medium Risk   PHQ-2 Score: 21  Financial Resource Strain: High Risk   Difficulty of Paying Living Expenses: Hard  Food Insecurity: Food Insecurity Present   Worried About Programme researcher, broadcasting/film/video in the Last Year: Sometimes true   Ran Out of Food in the Last Year: Sometimes true  Housing: Medium Risk   Last Housing Risk Score: 1  Physical Activity: Not on file  Social Connections: Not on file  Stress: Not on file  Tobacco Use: Medium Risk   Smoking Tobacco Use: Former   Smokeless Tobacco Use: Never   Passive Exposure: Not on Chartered certified accountant Needs: Unmet Transportation Needs   Lack of Transportation (Medical): Yes   Lack of Transportation (Non-Medical): No    SDOH Interventions: Financial Resources:    Receives SSI [phone])   Food Insecurity:   Lost food stamps- has submitted another application  Housing Insecurity:  Housing Interventions: Other (Comment) (referred to [facility] and to [employer])  Transportation:   Transportation Interventions: Other (Comment) (visa gift card to help with gas)   Follow-up plan:    Pt to follow up with [facility] to get on housing assistance list.  Pt will come to appt Monday for further work up and assistance with gas costs.  Will continue to follow and assist as needed  Burna Sis, LCSW Clinical Social Worker [facility] Desk#: [phone] Cell#: [phone]

## 2021-10-29 ENCOUNTER — Telehealth (HOSPITAL_COMMUNITY): Payer: Self-pay | Admitting: Licensed Clinical Social Worker

## 2021-10-29 ENCOUNTER — Encounter (HOSPITAL_COMMUNITY): Payer: Self-pay

## 2021-10-29 ENCOUNTER — Encounter (HOSPITAL_COMMUNITY): Payer: Medicaid Other

## 2021-10-29 NOTE — Progress Notes (Signed)
Heart and Vascular Care Navigation  10/29/2021  Cameron Jenkins 12-17-1975 [id number]  Reason for Referral: housing concerns, transportation concerns.   Engaged with patient by telephone for follow up visit for Heart and Vascular Care Coordination.                                                                                                   Assessment:      Pt reached out to CSW to inform he would not be able to make it to his appt today due to his ride falling through.   CSW helped rearrange transport for tomorrow at 2pm with help from clinic staff- pt reports he now has gas so will be able to make it to that appt.  CSW tried to work with pt to discuss housing resources.  Had worked with coworker on [employer] transitional housing referral but now pt stating he is not interested in going into a transitional program and denies need for assistance with rehab/drug cessation at all- reports he has remained sober since DC from hospital.  Pt had also worked on getting into [employer] but currently without a source of income.  Was getting SSI ($924/month?) but benefits were stopped for some reason- spoke with [facility] and was told they would restart next 1-2 months- has already missed February desposit date so hopefully will start in March- CSW offered to help call- pt will forward email from his case worker for me to assist in following up with.                                HRT/VAS Care Coordination     Living arrangements for the past 2 months Homeless   Lives with: Self   Patient Current Insurance Coverage Medicaid   Patient Has Concern With Paying Medical Bills No   Does Patient Have Prescription Coverage? Yes   Home Assistive Devices/Equipment None       Social History:                                                                             SDOH Screenings   Alcohol Screen: Not on file  Depression (PHQ2-9): Medium Risk   PHQ-2 Score: 21  Financial Resource Strain: High  Risk   Difficulty of Paying Living Expenses: Hard  Food Insecurity: Food Insecurity Present   Worried About Charity fundraiser in the Last Year: Sometimes true   Ran Out of Food in the Last Year: Sometimes true  Housing: Medium Risk   Last Housing Risk Score: 1  Physical Activity: Not on file  Social Connections: Not on file  Stress: Not on file  Tobacco Use: Medium Risk   Smoking Tobacco Use: Former   Smokeless  Tobacco Use: Never   Passive Exposure: Not on file  Transportation Needs: Unmet Transportation Needs   Lack of Transportation (Medical): Yes   Lack of Transportation (Non-Medical): No    SDOH Interventions: Financial Resources:    Was receiving SSI but benefits were stopped due to an error- awaiting their return was told should be started back in next month or so but hasn't spoken to someone with SSI in awhile  Food Insecurity:   Now only receiving $27/month in food stamps  Housing Insecurity:   Homeless at this time  Transportation:    Has a car but can't reliably afford gas    Follow-up plan:    CSW and pt to call [employer] to see when he will start to get money again once pt sends CSW case worker information.  Pt to come in for appt tomorrow for continued medical follow up  Jorge Ny, [facility] Desk#: [phone] Cell#: [phone]

## 2021-10-30 ENCOUNTER — Encounter (HOSPITAL_COMMUNITY): Payer: Medicaid Other

## 2021-10-30 ENCOUNTER — Encounter (HOSPITAL_COMMUNITY): Payer: Self-pay

## 2021-11-05 ENCOUNTER — Other Ambulatory Visit (HOSPITAL_COMMUNITY): Payer: Self-pay

## 2021-11-05 NOTE — Progress Notes (Signed)
Transitions of Care Pharmacy received 2 rx for this patient but we are a meds to beds pharmacy. I called the patient to transfer the prescriptions but he did not answer so I placed the prescriptions on hold.

## 2021-11-20 ENCOUNTER — Ambulatory Visit: Payer: Medicaid Other | Admitting: Nurse Practitioner

## 2022-01-07 ENCOUNTER — Other Ambulatory Visit (HOSPITAL_COMMUNITY): Payer: Self-pay

## 2022-03-26 ENCOUNTER — Other Ambulatory Visit (HOSPITAL_COMMUNITY): Payer: Self-pay

## 2022-06-03 ENCOUNTER — Telehealth (HOSPITAL_COMMUNITY): Payer: Self-pay

## 2022-06-03 NOTE — Telephone Encounter (Signed)
Called to confirm/remind patient of their appointment at the [facility] on 06/04/22.   Patient reminded to bring all medications and/or complete list.  Confirmed patient has transportation. Gave directions, instructed to utilize valet parking.  Confirmed appointment prior to ending call.   

## 2022-06-04 ENCOUNTER — Ambulatory Visit (HOSPITAL_COMMUNITY)
Admission: RE | Admit: 2022-06-04 | Discharge: 2022-06-04 | Disposition: A | Discharge status: Home / Self Care | Payer: Medicaid Other | Source: Ambulatory Visit | Attending: Family Medicine | Admitting: Family Medicine

## 2022-06-04 ENCOUNTER — Encounter (HOSPITAL_COMMUNITY): Payer: Self-pay

## 2022-06-04 VITALS — BP 110/82 | HR 102 | Wt 258.0 lb

## 2022-06-04 DIAGNOSIS — Z79899 Other long term (current) drug therapy: Secondary | ICD-10-CM | POA: Diagnosis not present

## 2022-06-04 DIAGNOSIS — I5022 Chronic systolic (congestive) heart failure: Secondary | ICD-10-CM | POA: Insufficient documentation

## 2022-06-04 DIAGNOSIS — I447 Left bundle-branch block, unspecified: Secondary | ICD-10-CM | POA: Insufficient documentation

## 2022-06-04 DIAGNOSIS — R0683 Snoring: Secondary | ICD-10-CM

## 2022-06-04 DIAGNOSIS — Z87891 Personal history of nicotine dependence: Secondary | ICD-10-CM | POA: Insufficient documentation

## 2022-06-04 DIAGNOSIS — F1910 Other psychoactive substance abuse, uncomplicated: Secondary | ICD-10-CM | POA: Diagnosis not present

## 2022-06-04 DIAGNOSIS — I10 Essential (primary) hypertension: Secondary | ICD-10-CM | POA: Diagnosis not present

## 2022-06-04 DIAGNOSIS — I110 Hypertensive heart disease with heart failure: Secondary | ICD-10-CM | POA: Insufficient documentation

## 2022-06-04 DIAGNOSIS — Z7984 Long term (current) use of oral hypoglycemic drugs: Secondary | ICD-10-CM | POA: Insufficient documentation

## 2022-06-04 DIAGNOSIS — D751 Secondary polycythemia: Secondary | ICD-10-CM | POA: Diagnosis not present

## 2022-06-04 DIAGNOSIS — Z139 Encounter for screening, unspecified: Secondary | ICD-10-CM

## 2022-06-04 LAB — BRAIN NATRIURETIC PEPTIDE: B Natriuretic Peptide: 483.7 pg/mL — ABNORMAL HIGH (ref 0.0–100.0)

## 2022-06-04 LAB — CBC
HCT: 52.5 % — ABNORMAL HIGH (ref 39.0–52.0)
Hemoglobin: 17.2 g/dL — ABNORMAL HIGH (ref 13.0–17.0)
MCH: 29.4 pg (ref 26.0–34.0)
MCHC: 32.8 g/dL (ref 30.0–36.0)
MCV: 89.7 fL (ref 80.0–100.0)
Platelets: 240 10*3/uL (ref 150–400)
RBC: 5.85 MIL/uL — ABNORMAL HIGH (ref 4.22–5.81)
RDW: 13.7 % (ref 11.5–15.5)
WBC: 7.5 10*3/uL (ref 4.0–10.5)
nRBC: 0 % (ref 0.0–0.2)

## 2022-06-04 LAB — COMPREHENSIVE METABOLIC PANEL
ALT: 23 U/L (ref 0–44)
AST: 32 U/L (ref 15–41)
Albumin: 3.7 g/dL (ref 3.5–5.0)
Alkaline Phosphatase: 76 U/L (ref 38–126)
Anion gap: 11 (ref 5–15)
BUN: 14 mg/dL (ref 6–20)
CO2: 24 mmol/L (ref 22–32)
Calcium: 9.4 mg/dL (ref 8.9–10.3)
Chloride: 107 mmol/L (ref 98–111)
Creatinine, Ser: 1.22 mg/dL (ref 0.61–1.24)
GFR, Estimated: >60 mL/min (ref >60)
Glucose, Bld: 121 mg/dL — ABNORMAL HIGH (ref 70–99)
Potassium: 3.6 mmol/L (ref 3.5–5.1)
Sodium: 142 mmol/L (ref 135–145)
Total Bilirubin: 1.4 mg/dL — ABNORMAL HIGH (ref 0.3–1.2)
Total Protein: 7.7 g/dL (ref 6.5–8.1)

## 2022-06-04 LAB — DIGOXIN LEVEL: Digoxin Level: <0.2 ng/mL — ABNORMAL LOW (ref 0.8–2.0)

## 2022-06-04 MED ORDER — CARVEDILOL 3.125 MG PO TABS: 3.1250 mg | ORAL_TABLET | Freq: Two times a day (BID) | ORAL | 11 refills | Status: DC

## 2022-06-04 NOTE — Progress Notes (Signed)
ADVANCED HF CLINIC CONSULT NOTE  Primary Care: Orion Crook I, NP HF Cardiologist: Dr. Gala Romney  HPI:  46 y.o. male with history of chronic systolic CHF with EF 10%, LBBB, HTN, OSA, hx COPD, anxiety, prior tobacco use, hx noncompliance, hx polysubstance abuse. Diagnosed while admitted to a hospital in [place] with CHF. No records from admission available. There was mention of possible LVAD placement and was discharged with LifeVest. He lost insurance and was not seen for follow-up.   He established care with Dr. Bing Matter in November 2021. Last seen in June 2022. Admitted to psych unit in September 2022 for detox from heroin. He had episode of syncope/? Possible asystole. Had very brief CPR. He was transferred to ICU at [facility]. He was seen by Cardiology. Echo with EF 15-20%. EP considered placement of CRT-D. Currently homeless and living in his vehicle. Defibrillator placement deferred until social situation more stable. Discharged home on carvedilol, spiro, valsartan and furosemide 40 daily. Did not follow-up with Cardiology after discharge.   Seen in ED at [facility] on 10/03/21 with chest tightness and dyspnea. Left before evaluated. Presented to [facility] ED yesterday evening with shortness of breath. Has been out of medications 2.5 months. Labs significant for BNP 1326, HS troponin 47>52, Scr 1.17. UDS positive for amphetamines. Diuresed with IV lasix and IV rocephin for cellulitis of abdomen. Echo showed EF < 20%, RV moderate to severely reduced, mild MR, dilated IVC. He refused cath or other HF therapies. Overall diuresed 26 lbs, GDMT titrated. Discharge weight 251 lbs.  Today he returns for post hospital HF follow up. We have not seen him since his hospitalization in 09/2021. Overall feeling fine. Feels poorly when he does not take his medications. He is not SOB walking on flat ground or ADLs. Denies palpitations, CP, dizziness, edema, or PND/Orthopnea. Appetite ok. No fever or chills. He does not  weigh at home. He has stable housing and transportation, has disability checks coming in now. Has been consistently taking meds for past week. Uses meth every few months, no IVDU, takes pain pills once a month for back pain. He snores and says he had a sleep study years ago.   Significant family hx substance abuse. At one point he had been sober for 15 years.    Cardiac Studies - Echo (1/23): EF < 20%, RV moderate to severely reduced, mild MR, dilated IVC.  - Echo (11/21) [facility] EF 15-20%  Review of Systems: [y] = yes, [ ]  = no    General: Weight gain [ ] ; Weight loss [ ] ; Anorexia [ ] ; Fatigue [ ] ; Fever [ ] ; Chills [ ] ; Weakness [ ]   Cardiac: Chest pain/pressure [ ] ; Resting SOB [ ] ; Exertional SOB [ ] ; Orthopnea [ ] ; Pedal Edema [ ] ; Palpitations [ ] ; Syncope [ ] ; Presyncope [ ] ; Paroxysmal nocturnal dyspnea[ ]   Pulmonary: Cough [ ] ; Wheezing[ ] ; Hemoptysis[ ] ; Sputum [ ] ; Snoring ]  GI: Vomiting[ ] ; Dysphagia[ ] ; Melena[ ] ; Hematochezia [ ] ; Heartburn[ ] ; Abdominal pain [ ] ; Constipation [ ] ; Diarrhea [ ] ; BRBPR [ ]   GU: Hematuria[ ] ; Dysuria [ ] ; Nocturia[ ]   Vascular: Pain in legs with walking [ ] ; Pain in feet with lying flat [ ] ; Non-healing sores [ ] ; Stroke [ ] ; TIA [ ] ; Slurred speech [ ] ;  Neuro: Headaches[ ] ; Vertigo[ ] ; Seizures[ ] ; Paresthesias[ ] ;Blurred vision [ ] ; Diplopia [ ] ; Vision changes [ ]   Ortho/Skin: Arthritis [ ] ; Joint pain ]; Muscle pain [ ] ;  Joint swelling [ ] ; Back Pain [ ] ; Rash [ ]   Psych: Depression[ ] ; Anxiety[ ]   Heme: Bleeding problems [ ] ; Clotting disorders [ ] ; Anemia [ ]   Endocrine: Diabetes [y]; Thyroid dysfunction[ ]   Past Medical History:  Diagnosis Date   Acute on chronic systolic congestive heart failure ([place]) 05/28/2020   Formatting of this note might be different from the original. IMO update 2021   Chest pain 05/28/2020   Congestive heart failure (CHF) ([place]) 07/21/2020   COPD (chronic obstructive pulmonary disease) ([place])    Dilated  cardiomyopathy ([place]) 07/21/2020   Dyspnea on exertion 07/21/2020   Essential hypertension 05/28/2020   Generalized anxiety disorder 05/28/2020   Heart failure (HCC)    LBBB (left bundle branch block) 05/28/2020   Nausea and vomiting 05/28/2020   Obstructive sleep apnea 07/21/2020   Pneumonia due to COVID-19 virus 05/28/2020   Formatting of this note might be different from the original. Diagnosed by SARS-CoV-2 PCR Cephid test at [facility] in [place], [place] on 05/28/2020.   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    Current Outpatient Medications  Medication Sig Dispense Refill   albuterol (PROVENTIL) (5 MG/ML) 0.5% nebulizer solution Take 2.5 mg by nebulization every 6 (six) hours as needed for wheezing or shortness of breath.     digoxin (LANOXIN) 0.125 MG tablet Take 1 tablet (0.125 mg total) by mouth daily. 30 tablet 3   empagliflozin (JARDIANCE) 10 MG TABS tablet Take 1 tablet (10 mg total) by mouth daily before breakfast. 30 tablet 3   furosemide (LASIX) 20 MG tablet Take 2 tablets (40 mg total) by mouth 2 (two) times daily. 120 tablet 2   sacubitril-valsartan (ENTRESTO) 24-26 MG Take 1 tablet by mouth 2 (two) times daily. 60 tablet 3   spironolactone (ALDACTONE) 25 MG tablet Take 1 tablet (25 mg total) by mouth daily for 30 days. 90 tablet 3   budesonide-formoterol (SYMBICORT) 160-4.5 MCG/ACT inhaler Inhale 2 puffs into the lungs 2 (two) times daily. (Patient not taking: Reported on 06/04/2022)     No current facility-administered medications for this encounter.   Allergies  Allergen Reactions   Acetaminophen     Pt reports nausea   Social History   Socioeconomic History   Marital status: Divorced    Spouse name: Not on file   Number of children: Not on file   Years of education: Not on file   Highest education level: Not on file  Occupational History   Not on file  Tobacco Use   Smoking status: Former    Types: Cigarettes    Quit date:  07/21/2013    Years since quitting: 8.8   Smokeless tobacco: Never  Vaping Use   Vaping Use: Never used  Substance and Sexual Activity   Alcohol use: Never   Drug use: Not Currently    Types: Marijuana   Sexual activity: Not Currently  Other Topics Concern   Not on file  Social History Narrative   Not on file   Social Determinants of Health   Financial Resource Strain: High Risk (10/05/2021)   Overall Financial Resource Strain (CARDIA)    Difficulty of Paying Living Expenses: Hard  Food Insecurity: Food Insecurity Present (10/05/2021)   Hunger Vital Sign    Worried About Running Out of Food in the Last Year: Sometimes true    Ran Out of Food in the Last Year: Sometimes true  Transportation Needs: Unmet Transportation Needs (10/24/2021)  PRAPARE - Administrator, Civil Service (Medical): Yes    Lack of Transportation (Non-Medical): No  Physical Activity: Not on file  Stress: Not on file  Social Connections: Not on file  Intimate Partner Violence: Not on file   Family History  Problem Relation Age of Onset   COPD Mother    Heart disease Mother    Heart attack Father    Stroke Father    Bone cancer Maternal Grandmother    Stomach cancer Maternal Grandfather    BP 110/82   Pulse (!) 102   Wt 117 kg (258 lb)   SpO2 97%   BMI 34.99 kg/m   Wt Readings from Last 3 Encounters:  06/04/22 117 kg (258 lb)  10/23/21 113.9 kg (251 lb)  10/09/21 106.3 kg (234 lb 6.4 oz)   PHYSICAL EXAM: General:  NAD. No resp difficulty, chronically-ill appearing. HEENT: Normal Neck: Supple. No JVD. Carotids 2+ bilat; no bruits. No lymphadenopathy or thryomegaly appreciated. Cor: PMI nondisplaced. Regular rate & rhythm. No rubs, gallops or murmurs. Lungs: Clear Abdomen: Soft, nontender, nondistended. No hepatosplenomegaly. No bruits or masses. Good bowel sounds. Extremities: No cyanosis, clubbing, rash, edema Neuro: Alert & oriented x 3, cranial nerves grossly intact. Moves all 4  extremities w/o difficulty. Affect pleasant.  ECG (personally reviewed): NSR, LBBB QRS 176 msec  ASSESSMENT & PLAN: 1. Chronic Systolic CHF/presumed NICM: - Diagnosed February 2021 while admitted for HF in [place]. EF 10% at that time - Echo (9/22) at [facility] EF 15-20% - Echo (10/05/21): EF < 20%, RV moderate to severely reduced, mild MR, dilated IVC - Expect CM at least in part d/t LBBB. - Refused cath.  - NYHA I-II, volume OK today. - Start carvedilol 3.125 mg bid. - Continue Lasix 40 mg bid. - Continue spironolactone 25 mg daily. - Continue Entresto 24/26 mg bid. - Continue digoxin 0.125 mg daily. - Continue Jardiance 10 mg daily. - I asked him to weigh daily. - Not a candidate for advanced therapies with substance abuse.  - Titrate GDMT and repeat echo. Consider referral to EP if EF < 35% if he can refrain from drug use. - Labs today.   2. LBBB - likely contributing to cardiomyopathy - Syncopal episode/possible asystole 09/22. Brief CPR. CRT-D considered at that time, not done d/t social situation and ab wall infection.  - QRS 176 msec on ECG today. - Can reconsider down the line if he can refrain from substance use.   3. HTN - Stable.  - GDMT as above.   4. Polysubstance abuse - Hx heroin and meth abuse. - UDS + for amphetamines during 1/23 admission - Long discussion about need for complete abstinence to qualify for adv therapies.   5. Polycythemia - suspect OSA - Arrange in lab sleep study.  6. Snoring - Sleep study as above.  7. SDOH - He has stable housing, transportation, and cell phone now. - He has a scale. - He has food stamps and disability check.  Follow up in 4 weeks with APP and 3 months with Dr. Gala Romney + echo.  Prince Rome, FNP-BC 06/04/22

## 2022-06-04 NOTE — Patient Instructions (Addendum)
EKG done today.  Labs done today. We will contact you only if your labs are abnormal.  START Carvedilol 3.125mg  (1 tablet) by mouth 2 times daily.  No other medication changes were made. Please continue all current medications as prescribed.  Your physician has recommended that you have a sleep study. This test records several body functions during sleep, including: brain activity, eye movement, oxygen and carbon dioxide blood levels, heart rate and rhythm, breathing rate and rhythm, the flow of air through your mouth and nose, snoring, body muscle movements, and chest and belly movement. They will contact you to schedule an appointment.   Your physician recommends that you schedule a follow-up appointment in: 4 weeks with our NP/PA Clinic here in our office and in 3 months with Dr.Bensimhon with an echo prior to your exam.   Your physician has requested that you have an echocardiogram. Echocardiography is a painless test that uses sound waves to create images of your heart. It provides your doctor with information about the size and shape of your heart and how well your heart's chambers and valves are working. This procedure takes approximately one hour. There are no restrictions for this procedure.  If you have any questions or concerns before your next appointment please send [place] a message through [place] or call our office at [phone].    TO LEAVE A MESSAGE FOR THE NURSE SELECT OPTION 2, PLEASE LEAVE A MESSAGE INCLUDING: YOUR NAME DATE OF BIRTH CALL BACK NUMBER REASON FOR CALL**this is important as we prioritize the call backs  YOU WILL RECEIVE A CALL BACK THE SAME DAY AS LONG AS YOU CALL BEFORE 4:00 PM   Do the following things EVERYDAY: Weigh yourself in the morning before breakfast. Write it down and keep it in a log. Take your medicines as prescribed Eat low salt foods--Limit salt (sodium) to 2000 mg per day.  Stay as active as you can everyday Limit all fluids for the day to  less than 2 liters   At the [facility], you and your health needs are our priority. As part of our continuing mission to provide you with exceptional heart care, we have created designated Provider Care Teams. These Care Teams include your primary Cardiologist (physician) and Advanced Practice Providers (APPs- Physician Assistants and Nurse Practitioners) who all work together to provide you with the care you need, when you need it.   You may see any of the following providers on your designated Care Team at your next follow up: Dr Glori Bickers Dr Haynes Kerns, NP Lyda Jester, [place] Audry Riles, PharmD   Please be sure to bring in all your medications bottles to every appointment.

## 2022-06-04 NOTE — Progress Notes (Signed)
Patient Name: Cameron Jenkins        DOB: January 05, 1976      Height: 6'    Weight: 258lbs  Office Name:[facility]         Referring Provider: Allena Katz, NP  Today's Date: 06/04/2022  Date:  06/04/2022 STOP BANG RISK ASSESSMENT S (snore) Have you been told that you snore?     YES   T (tired) Are you often tired, fatigued, or sleepy during the day?   YES  O (obstruction) Do you stop breathing, choke, or gasp during sleep? YES   P (pressure) Do you have or are you being treated for high blood pressure? YES   B (BMI) Is your body index greater than 35 kg/m? NO   A (age) Are you 68 years old or older? NO   N (neck) Do you have a neck circumference greater than 16 inches?   YES/NO   G (gender) Are you a male? YES   TOTAL STOP/BANG "YES" ANSWERS 5                                                                       For Office Use Only              Procedure Order Form    YES to 3+ Stop Bang questions OR two clinical symptoms - patient qualifies for WatchPAT (CPT 95800)             Clinical Notes: Will consult Sleep Specialist and refer for management of therapy due to patient increased risk of Sleep Apnea. Ordering a sleep study due to the following two clinical symptoms: Excessive daytime sleepiness G47.10 / Gastroesophageal reflux K21.9 / Nocturia R35.1 / Morning Headaches G44.221 / Difficulty concentrating R41.840 / Memory problems or poor judgment G31.84 / Personality changes or irritability R45.4 / Loud snoring R06.83 / Depression F32.9 / Unrefreshed by sleep G47.8 / Impotence N52.9 / History of high blood pressure R03.0 / Insomnia G47.00    I understand that I am proceeding with a home sleep apnea test as ordered by my treating physician. I understand that untreated sleep apnea is a serious cardiovascular risk factor and it is my responsibility to perform the test and seek management for sleep apnea. I will be contacted with the results and be managed for  sleep apnea by a local sleep physician. I will be receiving equipment and further instructions from [facility]. I shall promptly ship back the equipment via the included mailing label. I understand my insurance will be billed for the test and as the patient I am responsible for any insurance related out-of-pocket costs incurred. I have been provided with written instructions and can call for additional video or telephonic instruction, with 24-hour availability of qualified personnel to answer any questions: Patient Help Desk [phone].  Patient Signature ______________________________________________________   Date______________________ Patient Telemedicine Verbal Consent

## 2022-07-01 ENCOUNTER — Telehealth (HOSPITAL_COMMUNITY): Payer: Self-pay

## 2022-07-01 NOTE — Progress Notes (Signed)
ADVANCED HF CLINIC NOTE  Primary Care: Orion Crook I, NP HF Cardiologist: Dr. Gala Romney  HPI: 46 y.o. male with history of chronic systolic CHF with EF 10%, LBBB, HTN, OSA, hx COPD, anxiety, prior tobacco use, hx noncompliance, hx polysubstance abuse. Diagnosed while admitted to a hospital in [place] with CHF. No records from admission available. There was mention of possible LVAD placement and was discharged with LifeVest. He lost insurance and was not seen for follow-up.   He established care with Dr. Bing Matter in November 2021. Last seen in June 2022. Admitted to psych unit in September 2022 for detox from heroin. He had episode of syncope/? Possible asystole. Had very brief CPR. He was transferred to ICU at [facility]. He was seen by Cardiology. Echo with EF 15-20%. EP considered placement of CRT-D. Defibrillator placement deferred until social situation more stable (he was homeless at the time). Discharged on carvedilol, spiro, valsartan and furosemide 40 daily. Did not follow-up with Cardiology after discharge.   Seen in ED at [facility] on 10/03/21 with chest tightness and dyspnea. Left before evaluated. Admitted with a/c CHF. Has been out of medications 2.5 months. UDS + for amphetamines. Diuresed with IV lasix and IV rocephin for cellulitis of abdomen. Echo showed EF < 20%, RV moderate to severely reduced, mild MR, dilated IVC. He refused cath or other HF therapies. Overall diuresed 26 lbs, GDMT titrated. Discharge weight 251 lbs.  Follow up 9/23, NYHA II, and volume stable. Coreg started. Sleep study arranged.  Today he returns for HF follow up. Overall feeling "amazing." Able to push-mow grass without dyspnea. Denies palpitations, CP, dizziness, edema, or PND. Chronically sleeps in a recliner. Appetite ok. No fever or chills. Weight at home 259 pounds. Never picked up digoxin refill last visit. No further drug use.  Significant family hx substance abuse. At one point he had been sober for  15 years.    Cardiac Studies - Echo (1/23): EF < 20%, RV moderate to severely reduced, mild MR, dilated IVC.  - Echo (11/21) [facility] EF 15-20%  Past Medical History:  Diagnosis Date   Acute on chronic systolic congestive heart failure (HCC) 05/28/2020   Formatting of this note might be different from the original. IMO update 2021   Chest pain 05/28/2020   Congestive heart failure (CHF) (HCC) 07/21/2020   COPD (chronic obstructive pulmonary disease) (HCC)    Dilated cardiomyopathy (HCC) 07/21/2020   Dyspnea on exertion 07/21/2020   Essential hypertension 05/28/2020   Generalized anxiety disorder 05/28/2020   Heart failure (HCC)    LBBB (left bundle branch block) 05/28/2020   Nausea and vomiting 05/28/2020   Obstructive sleep apnea 07/21/2020   Pneumonia due to COVID-19 virus 05/28/2020   Formatting of this note might be different from the original. Diagnosed by SARS-CoV-2 PCR Cephid test at [facility] in [place], [place] on 05/28/2020.   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    Current Outpatient Medications  Medication Sig Dispense Refill   albuterol (PROVENTIL) (5 MG/ML) 0.5% nebulizer solution Take 2.5 mg by nebulization every 6 (six) hours as needed for wheezing or shortness of breath.     carvedilol (COREG) 3.125 MG tablet Take 1 tablet (3.125 mg total) by mouth 2 (two) times daily. 60 tablet 11   empagliflozin (JARDIANCE) 10 MG TABS tablet Take 1 tablet (10 mg total) by mouth daily before breakfast. 30 tablet 3   furosemide (LASIX) 20 MG tablet Take 2 tablets (40 mg total)  by mouth 2 (two) times daily. 120 tablet 2   sacubitril-valsartan (ENTRESTO) 24-26 MG Take 1 tablet by mouth 2 (two) times daily. 60 tablet 3   spironolactone (ALDACTONE) 25 MG tablet Take 1 tablet (25 mg total) by mouth daily for 30 days. 90 tablet 3   budesonide-formoterol (SYMBICORT) 160-4.5 MCG/ACT inhaler Inhale 2 puffs into the lungs 2 (two) times daily. (Patient not taking:  Reported on 06/04/2022)     No current facility-administered medications for this encounter.   Allergies  Allergen Reactions   Acetaminophen     Pt reports nausea   Social History   Socioeconomic History   Marital status: Divorced    Spouse name: Not on file   Number of children: Not on file   Years of education: Not on file   Highest education level: Not on file  Occupational History   Not on file  Tobacco Use   Smoking status: Former    Types: Cigarettes    Quit date: 07/21/2013    Years since quitting: 8.9   Smokeless tobacco: Never  Vaping Use   Vaping Use: Never used  Substance and Sexual Activity   Alcohol use: Never   Drug use: Not Currently    Types: Marijuana   Sexual activity: Not Currently  Other Topics Concern   Not on file  Social History Narrative   Not on file   Social Determinants of Health   Financial Resource Strain: High Risk (10/05/2021)   Overall Financial Resource Strain (CARDIA)    Difficulty of Paying Living Expenses: Hard  Food Insecurity: Food Insecurity Present (10/05/2021)   Hunger Vital Sign    Worried About Running Out of Food in the Last Year: Sometimes true    Ran Out of Food in the Last Year: Sometimes true  Transportation Needs: Unmet Transportation Needs (10/24/2021)   PRAPARE - Administrator, Civil Service (Medical): Yes    Lack of Transportation (Non-Medical): No  Physical Activity: Not on file  Stress: Not on file  Social Connections: Not on file  Intimate Partner Violence: Not on file   Family History  Problem Relation Age of Onset   COPD Mother    Heart disease Mother    Heart attack Father    Stroke Father    Bone cancer Maternal Grandmother    Stomach cancer Maternal Grandfather    BP 110/74   Pulse 97   Wt 120.6 kg (265 lb 12.8 oz)   SpO2 99%   BMI 36.05 kg/m   Wt Readings from Last 3 Encounters:  07/02/22 120.6 kg (265 lb 12.8 oz)  06/04/22 117 kg (258 lb)  10/23/21 113.9 kg (251 lb)    PHYSICAL EXAM: General:  NAD. No resp difficulty HEENT: Normal Neck: Supple. No JVD. Carotids 2+ bilat; no bruits. No lymphadenopathy or thryomegaly appreciated. Cor: PMI nondisplaced. Regular rate & rhythm. No rubs, gallops or murmurs. Lungs: Clear Abdomen: Soft, nontender, nondistended. No hepatosplenomegaly. No bruits or masses. Good bowel sounds. Extremities: No cyanosis, clubbing, rash, edema Neuro: Alert & oriented x 3, cranial nerves grossly intact. Moves all 4 extremities w/o difficulty. Affect pleasant.  ASSESSMENT & PLAN: 1. Chronic Systolic CHF/presumed NICM: - Diagnosed February 2021 while admitted for HF in [place]. EF 10% at that time - Echo (9/22) at [facility] EF 15-20% - Echo (10/05/21): EF < 20%, RV moderate to severely reduced, mild MR, dilated IVC - Expect CM at least in part d/t LBBB. - Refused cath.  - NYHA I-II,  volume OK today. Weight up some, weighted with heavy boots on today. - Restart digoxin 0.125 mg daily. - Increase carvedilol to 6.25 mg bid. - Continue Lasix 40 mg bid. - Continue spironolactone 25 mg daily. - Continue Entresto 24/26 mg bid. - Continue Jardiance 10 mg daily. - Potential candidate for advanced therapies if he can refrain from substance abuse.  - Repeat echo next visit. Consider referral to EP if EF < 35% if he can refrain from drug use. - Labs today.   2. LBBB - likely contributing to cardiomyopathy - Syncopal episode/possible asystole 09/22. Brief CPR. CRT-D considered at that time, not done d/t social situation and ab wall infection.  - QRS 176 msec on most recent ECG. - Can reconsider down the line if he can refrain from substance use.   3. HTN - Stable.  - GDMT as above.   4. Polysubstance abuse - Hx heroin and meth abuse. - UDS + for amphetamines during 1/23 admission - Long discussion about need for complete abstinence to qualify for adv therapies.   5. Polycythemia - suspect OSA - In lab sleep study arranged.  6.  Snoring - Sleep study as above.  7. SDOH - He has stable housing, transportation, and cell phone now. - He has a scale. - He has food stamps and disability check.  Follow up in 2- 3 months with Dr. Haroldine Laws + echo.  Allena Katz, FNP-BC 07/02/22

## 2022-07-01 NOTE — Telephone Encounter (Signed)
Called to confirm/remind patient of their appointment at the [facility] on 07/02/22.   Patient reminded to bring all medications and/or complete list.  Confirmed patient has transportation. Gave directions, instructed to utilize valet parking.  Confirmed appointment prior to ending call.   

## 2022-07-02 ENCOUNTER — Ambulatory Visit (HOSPITAL_COMMUNITY)
Admission: RE | Admit: 2022-07-02 | Discharge: 2022-07-02 | Disposition: A | Discharge status: Home / Self Care | Payer: Medicaid Other | Source: Ambulatory Visit | Attending: Family Medicine | Admitting: Family Medicine

## 2022-07-02 ENCOUNTER — Encounter (HOSPITAL_COMMUNITY): Payer: Self-pay

## 2022-07-02 VITALS — BP 110/74 | HR 97 | Wt 265.8 lb

## 2022-07-02 DIAGNOSIS — G4733 Obstructive sleep apnea (adult) (pediatric): Secondary | ICD-10-CM | POA: Diagnosis not present

## 2022-07-02 DIAGNOSIS — F1290 Cannabis use, unspecified, uncomplicated: Secondary | ICD-10-CM | POA: Insufficient documentation

## 2022-07-02 DIAGNOSIS — Z79899 Other long term (current) drug therapy: Secondary | ICD-10-CM | POA: Diagnosis not present

## 2022-07-02 DIAGNOSIS — I10 Essential (primary) hypertension: Secondary | ICD-10-CM

## 2022-07-02 DIAGNOSIS — I5022 Chronic systolic (congestive) heart failure: Secondary | ICD-10-CM | POA: Insufficient documentation

## 2022-07-02 DIAGNOSIS — I420 Dilated cardiomyopathy: Secondary | ICD-10-CM | POA: Insufficient documentation

## 2022-07-02 DIAGNOSIS — Z87891 Personal history of nicotine dependence: Secondary | ICD-10-CM | POA: Insufficient documentation

## 2022-07-02 DIAGNOSIS — Z91148 Patient's other noncompliance with medication regimen for other reason: Secondary | ICD-10-CM | POA: Insufficient documentation

## 2022-07-02 DIAGNOSIS — I110 Hypertensive heart disease with heart failure: Secondary | ICD-10-CM | POA: Insufficient documentation

## 2022-07-02 DIAGNOSIS — I447 Left bundle-branch block, unspecified: Secondary | ICD-10-CM | POA: Diagnosis not present

## 2022-07-02 DIAGNOSIS — F1910 Other psychoactive substance abuse, uncomplicated: Secondary | ICD-10-CM | POA: Diagnosis not present

## 2022-07-02 DIAGNOSIS — J449 Chronic obstructive pulmonary disease, unspecified: Secondary | ICD-10-CM | POA: Diagnosis not present

## 2022-07-02 DIAGNOSIS — Z7984 Long term (current) use of oral hypoglycemic drugs: Secondary | ICD-10-CM | POA: Diagnosis not present

## 2022-07-02 DIAGNOSIS — Z139 Encounter for screening, unspecified: Secondary | ICD-10-CM

## 2022-07-02 DIAGNOSIS — R0683 Snoring: Secondary | ICD-10-CM

## 2022-07-02 DIAGNOSIS — D751 Secondary polycythemia: Secondary | ICD-10-CM | POA: Diagnosis not present

## 2022-07-02 LAB — BASIC METABOLIC PANEL
Anion gap: 11 (ref 5–15)
BUN: 13 mg/dL (ref 6–20)
CO2: 23 mmol/L (ref 22–32)
Calcium: 9.4 mg/dL (ref 8.9–10.3)
Chloride: 105 mmol/L (ref 98–111)
Creatinine, Ser: 0.99 mg/dL (ref 0.61–1.24)
GFR, Estimated: >60 mL/min (ref >60)
Glucose, Bld: 118 mg/dL — ABNORMAL HIGH (ref 70–99)
Potassium: 4 mmol/L (ref 3.5–5.1)
Sodium: 139 mmol/L (ref 135–145)

## 2022-07-02 LAB — BRAIN NATRIURETIC PEPTIDE: B Natriuretic Peptide: 289.6 pg/mL — ABNORMAL HIGH (ref 0.0–100.0)

## 2022-07-02 MED ORDER — CARVEDILOL 6.25 MG PO TABS: 6.2500 mg | ORAL_TABLET | Freq: Two times a day (BID) | ORAL | 11 refills | Status: DC

## 2022-07-02 MED ORDER — DIGOXIN 125 MCG PO TABS: 0.1250 mg | ORAL_TABLET | Freq: Every day | ORAL | 3 refills | Status: DC

## 2022-07-02 NOTE — Patient Instructions (Signed)
RESTART Digoxin 0.125mg  one tab daily INCREASE Coreg to 6.25 mg one tab daily  Labs today We will only contact you if something comes back abnormal or we need to make some changes. Otherwise no news is good news!    Your physician wants you to follow-up in: 2-3 months with Dr Haroldine Laws and echo (December 2023) You will receive a reminder letter in the mail two months in advance. If you don't receive a letter, please call our office to schedule the follow-up appointment.    Do the following things EVERYDAY: Weigh yourself in the morning before breakfast. Write it down and keep it in a log. Take your medicines as prescribed Eat low salt foods--Limit salt (sodium) to 2000 mg per day.  Stay as active as you can everyday Limit all fluids for the day to less than 2 liters   At the [facility], you and your health needs are our priority. As part of our continuing mission to provide you with exceptional heart care, we have created designated Provider Care Teams. These Care Teams include your primary Cardiologist (physician) and Advanced Practice Providers (APPs- Physician Assistants and Nurse Practitioners) who all work together to provide you with the care you need, when you need it.   You may see any of the following providers on your designated Care Team at your next follow up: Dr Glori Bickers Dr Loralie Champagne Dr. Roxana Hires, NP Lyda Jester, [place] [facility] [place], [place] Forestine Na, NP Audry Riles, PharmD   Please be sure to bring in all your medications bottles to every appointment.    If you have any questions or concerns before your next appointment please send [place] a message through [place] or call our office at [phone].    TO LEAVE A MESSAGE FOR THE NURSE SELECT OPTION 2, PLEASE LEAVE A MESSAGE INCLUDING: YOUR NAME DATE OF BIRTH CALL BACK NUMBER REASON FOR CALL**this is important as we prioritize the call backs  YOU  WILL RECEIVE A CALL BACK THE SAME DAY AS LONG AS YOU CALL BEFORE 4:00 PM

## 2022-10-07 ENCOUNTER — Ambulatory Visit (HOSPITAL_COMMUNITY)
Admission: RE | Admit: 2022-10-07 | Discharge: 2022-10-07 | Disposition: A | Discharge status: Home / Self Care | Payer: Medicaid Other | Source: Ambulatory Visit | Attending: Family Medicine | Admitting: Family Medicine

## 2022-10-07 DIAGNOSIS — I5022 Chronic systolic (congestive) heart failure: Secondary | ICD-10-CM | POA: Diagnosis present

## 2022-10-07 DIAGNOSIS — Z87891 Personal history of nicotine dependence: Secondary | ICD-10-CM | POA: Diagnosis not present

## 2022-10-07 DIAGNOSIS — I081 Rheumatic disorders of both mitral and tricuspid valves: Secondary | ICD-10-CM | POA: Insufficient documentation

## 2022-10-07 DIAGNOSIS — I447 Left bundle-branch block, unspecified: Secondary | ICD-10-CM | POA: Insufficient documentation

## 2022-10-07 DIAGNOSIS — J449 Chronic obstructive pulmonary disease, unspecified: Secondary | ICD-10-CM | POA: Insufficient documentation

## 2022-10-07 DIAGNOSIS — I110 Hypertensive heart disease with heart failure: Secondary | ICD-10-CM | POA: Insufficient documentation

## 2022-10-07 LAB — ECHOCARDIOGRAM COMPLETE
Area-P 1/2: 5.58 cm2
S' Lateral: 6.1 cm

## 2022-10-08 ENCOUNTER — Telehealth (HOSPITAL_COMMUNITY): Payer: Self-pay | Admitting: Surgery

## 2022-10-08 ENCOUNTER — Telehealth (HOSPITAL_COMMUNITY): Payer: Self-pay | Admitting: Vascular Surgery

## 2022-10-08 NOTE — Telephone Encounter (Signed)
Lvm giving pt f/u 2/2 @ 940 w/ db, asked pt to call back to confirm

## 2022-10-08 NOTE — Telephone Encounter (Signed)
I called patient to review results and recommendations per provider.  I left a message for a return call and I have forwarded the request for an appt with Dr. Haroldine Laws to Patient access advocate for scheduling.

## 2022-10-09 ENCOUNTER — Other Ambulatory Visit (HOSPITAL_COMMUNITY): Payer: Self-pay

## 2022-10-09 ENCOUNTER — Telehealth (HOSPITAL_COMMUNITY): Payer: Self-pay

## 2022-10-09 NOTE — Telephone Encounter (Signed)
Advanced Heart Failure Patient Advocate Encounter  Prior authorization is required for Entresto. PA submitted and APPROVED on 10/09/22.  [place] Tracks Lorin Glass [id number] W Effective: 10/09/22 - 10/09/23  Clista Bernhardt, CPhT Rx Patient Advocate Phone: [phone]

## 2022-10-14 ENCOUNTER — Other Ambulatory Visit (HOSPITAL_COMMUNITY): Payer: Self-pay | Admitting: Cardiology

## 2022-10-14 DIAGNOSIS — I5042 Chronic combined systolic (congestive) and diastolic (congestive) heart failure: Secondary | ICD-10-CM

## 2022-10-14 MED ORDER — FUROSEMIDE 20 MG PO TABS: 40.0000 mg | ORAL_TABLET | Freq: Two times a day (BID) | ORAL | 6 refills | Status: DC

## 2022-10-18 ENCOUNTER — Encounter (HOSPITAL_COMMUNITY): Payer: Medicaid Other | Admitting: Internal Medicine

## 2022-10-28 ENCOUNTER — Other Ambulatory Visit (HOSPITAL_COMMUNITY): Payer: Self-pay

## 2022-10-28 ENCOUNTER — Ambulatory Visit (HOSPITAL_COMMUNITY)
Admission: RE | Admit: 2022-10-28 | Discharge: 2022-10-28 | Disposition: A | Discharge status: Home / Self Care | Payer: Medicaid Other | Source: Ambulatory Visit | Attending: Internal Medicine | Admitting: Internal Medicine

## 2022-10-28 ENCOUNTER — Encounter (HOSPITAL_COMMUNITY): Payer: Self-pay | Admitting: Internal Medicine

## 2022-10-28 VITALS — BP 124/80 | HR 104 | Wt 276.8 lb

## 2022-10-28 DIAGNOSIS — Z79899 Other long term (current) drug therapy: Secondary | ICD-10-CM | POA: Insufficient documentation

## 2022-10-28 DIAGNOSIS — I5022 Chronic systolic (congestive) heart failure: Secondary | ICD-10-CM

## 2022-10-28 DIAGNOSIS — Z87891 Personal history of nicotine dependence: Secondary | ICD-10-CM | POA: Insufficient documentation

## 2022-10-28 DIAGNOSIS — J449 Chronic obstructive pulmonary disease, unspecified: Secondary | ICD-10-CM | POA: Insufficient documentation

## 2022-10-28 DIAGNOSIS — D751 Secondary polycythemia: Secondary | ICD-10-CM | POA: Diagnosis not present

## 2022-10-28 DIAGNOSIS — I447 Left bundle-branch block, unspecified: Secondary | ICD-10-CM | POA: Insufficient documentation

## 2022-10-28 DIAGNOSIS — F1910 Other psychoactive substance abuse, uncomplicated: Secondary | ICD-10-CM | POA: Insufficient documentation

## 2022-10-28 DIAGNOSIS — Z7984 Long term (current) use of oral hypoglycemic drugs: Secondary | ICD-10-CM | POA: Diagnosis not present

## 2022-10-28 DIAGNOSIS — I110 Hypertensive heart disease with heart failure: Secondary | ICD-10-CM | POA: Insufficient documentation

## 2022-10-28 DIAGNOSIS — G4733 Obstructive sleep apnea (adult) (pediatric): Secondary | ICD-10-CM | POA: Diagnosis not present

## 2022-10-28 DIAGNOSIS — R0683 Snoring: Secondary | ICD-10-CM | POA: Diagnosis not present

## 2022-10-28 LAB — BASIC METABOLIC PANEL
Anion gap: 12 (ref 5–15)
BUN: 17 mg/dL (ref 6–20)
CO2: 22 mmol/L (ref 22–32)
Calcium: 9.1 mg/dL (ref 8.9–10.3)
Chloride: 103 mmol/L (ref 98–111)
Creatinine, Ser: 1.04 mg/dL (ref 0.61–1.24)
GFR, Estimated: >60 mL/min (ref >60)
Glucose, Bld: 169 mg/dL — ABNORMAL HIGH (ref 70–99)
Potassium: 3.7 mmol/L (ref 3.5–5.1)
Sodium: 137 mmol/L (ref 135–145)

## 2022-10-28 LAB — CBC
HCT: 48.8 % (ref 39.0–52.0)
Hemoglobin: 16.4 g/dL (ref 13.0–17.0)
MCH: 29.9 pg (ref 26.0–34.0)
MCHC: 33.6 g/dL (ref 30.0–36.0)
MCV: 88.9 fL (ref 80.0–100.0)
Platelets: 194 10*3/uL (ref 150–400)
RBC: 5.49 MIL/uL (ref 4.22–5.81)
RDW: 13.5 % (ref 11.5–15.5)
WBC: 6.5 10*3/uL (ref 4.0–10.5)
nRBC: 0 % (ref 0.0–0.2)

## 2022-10-28 LAB — BRAIN NATRIURETIC PEPTIDE: B Natriuretic Peptide: 230.9 pg/mL — ABNORMAL HIGH (ref 0.0–100.0)

## 2022-10-28 MED ORDER — SPIRONOLACTONE 25 MG PO TABS: 25.0000 mg | ORAL_TABLET | Freq: Every day | ORAL | 3 refills | Status: DC

## 2022-10-28 MED ORDER — EMPAGLIFLOZIN 10 MG PO TABS: 10.0000 mg | ORAL_TABLET | Freq: Every day | ORAL | 3 refills | Status: DC

## 2022-10-28 MED ORDER — METOLAZONE 2.5 MG PO TABS: 2.5000 mg | ORAL_TABLET | ORAL | 3 refills | Status: DC

## 2022-10-28 MED ORDER — POTASSIUM CHLORIDE CRYS ER 20 MEQ PO TBCR: 40.0000 meq | EXTENDED_RELEASE_TABLET | Freq: Two times a day (BID) | ORAL | 5 refills | Status: DC

## 2022-10-28 MED ORDER — ENTRESTO 24-26 MG PO TABS: 1.0000 | ORAL_TABLET | Freq: Two times a day (BID) | ORAL | 3 refills | Status: DC

## 2022-10-28 MED ORDER — DIGOXIN 125 MCG PO TABS: 0.1250 mg | ORAL_TABLET | Freq: Every day | ORAL | 3 refills | Status: DC

## 2022-10-28 NOTE — Progress Notes (Signed)
ADVANCED HF CLINIC NOTE  Primary Care: Patient, No Pcp Per HF Cardiologist: Dr. Haroldine Laws  HPI: 47 y.o. male with history of chronic systolic CHF with EF [id number], LBBB, HTN, OSA, hx COPD, anxiety, prior tobacco use, hx noncompliance, hx polysubstance abuse. Diagnosed while admitted to a hospital in [place] with CHF. No records from admission available. There was mention of possible LVAD placement and was discharged with LifeVest. He lost insurance and was not seen for follow-up.   He established care with Dr. Agustin Cree in November 2021. Last seen in June 2022. Admitted to psych unit in September 2022 for detox from heroin. He had episode of syncope/? Possible asystole. Had very brief CPR. He was transferred to ICU at [facility]. He was seen by Cardiology. Echo with EF 15-20%. EP considered placement of CRT-D. Defibrillator placement deferred until social situation more stable (he was homeless at the time). Discharged on carvedilol, spiro, valsartan and furosemide 40 daily. Did not follow-up with Cardiology after discharge.   Seen in ED at [facility] on 10/03/21 with chest tightness and dyspnea. Left before evaluated. Admitted with a/c CHF. Has been out of medications 2.5 months. UDS + for amphetamines. Diuresed with IV lasix and IV rocephin for cellulitis of abdomen. Echo showed EF < 20%, RV moderate to severely reduced, mild MR, dilated IVC. He refused cath or other HF therapies. Overall diuresed 26 lbs, GDMT titrated. Discharge weight 251 lbs.  Follow up 9/23, NYHA II, and volume stable. Coreg started. Sleep study arranged.  Echo 1/24 EF 20-25%  RV normal   Today he returns for HF follow up. Says he has been putting on weight and feels "real tired". Has gained about 20 pounds in last 6 months. Ran out of 3 of his HF meds on Friday. Martin Majestic to Pharmacy and couldn't get them refilled). No longer living in his car. No longer using drugs.   Significant family hx substance abuse. At one point he had been sober  for 15 years.    Cardiac Studies - Echo (1/23): EF < 20%, RV moderate to severely reduced, mild MR, dilated IVC.  - Echo (11/21) [facility] EF 15-20%  Past Medical History:  Diagnosis Date   Acute on chronic systolic congestive heart failure ([place]) 05/28/2020   Formatting of this note might be different from the original. IMO update 2021   Chest pain 05/28/2020   Congestive heart failure (CHF) ([place]) 07/21/2020   COPD (chronic obstructive pulmonary disease) ([place])    Dilated cardiomyopathy ([place]) 07/21/2020   Dyspnea on exertion 07/21/2020   Essential hypertension 05/28/2020   Generalized anxiety disorder 05/28/2020   Heart failure (HCC)    LBBB (left bundle branch block) 05/28/2020   Nausea and vomiting 05/28/2020   Obstructive sleep apnea 07/21/2020   Pneumonia due to COVID-19 virus 05/28/2020   Formatting of this note might be different from the original. Diagnosed by SARS-CoV-2 PCR [facility]phid test at [facility] [place], [place] on 05/28/2020.   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    Current Outpatient Medications  Medication Sig Dispense Refill   albuterol (PROVENTIL) (5 MG/ML) 0.5% nebulizer solution Take 2.5 mg by nebulization every 6 (six) hours as needed for wheezing or shortness of breath.     carvedilol (COREG) 6.25 MG tablet Take 1 tablet (6.25 mg total) by mouth 2 (two) times daily. 60 tablet 11   digoxin (LANOXIN) 0.125 MG tablet Take 1 tablet (0.125 mg total) by mouth daily. 90 tablet 3  empagliflozin (JARDIANCE) 10 MG TABS tablet Take 1 tablet (10 mg total) by mouth daily before breakfast. 30 tablet 3   furosemide (LASIX) 20 MG tablet Take 2 tablets (40 mg total) by mouth 2 (two) times daily. 120 tablet 6   sacubitril-valsartan (ENTRESTO) 24-26 MG Take 1 tablet by mouth 2 (two) times daily. 60 tablet 3   spironolactone (ALDACTONE) 25 MG tablet Take 1 tablet (25 mg total) by mouth daily for 30 days. 90 tablet 3   budesonide-formoterol (SYMBICORT)  160-4.5 MCG/ACT inhaler Inhale 2 puffs into the lungs 2 (two) times daily. (Patient not taking: Reported on 06/04/2022)     No current facility-administered medications for this encounter.   Allergies  Allergen Reactions   Acetaminophen     Pt reports nausea   Social History   Socioeconomic History   Marital status: Divorced    Spouse name: Not on file   Number of children: Not on file   Years of education: Not on file   Highest education level: Not on file  Occupational History   Not on file  Tobacco Use   Smoking status: Former    Types: Cigarettes    Quit date: 07/21/2013    Years since quitting: 9.2   Smokeless tobacco: Never  Vaping Use   Vaping Use: Never used  Substance and Sexual Activity   Alcohol use: Never   Drug use: Not Currently    Types: Marijuana   Sexual activity: Not Currently  Other Topics Concern   Not on file  Social History Narrative   Not on file   Social Determinants of Health   Financial Resource Strain: High Risk (10/05/2021)   Overall Financial Resource Strain (CARDIA)    Difficulty of Paying Living Expenses: Hard  Food Insecurity: Food Insecurity Present (10/05/2021)   Hunger Vital Sign    Worried About Running Out of Food in the Last Year: Sometimes true    Ran Out of Food in the Last Year: Sometimes true  Transportation Needs: Unmet Transportation Needs (10/24/2021)   PRAPARE - Hydrologist (Medical): Yes    Lack of Transportation (Non-Medical): No  Physical Activity: Not on file  Stress: Not on file  Social Connections: Not on file  Intimate Partner Violence: Not on file   Family History  Problem Relation Age of Onset   COPD Mother    Heart disease Mother    Heart attack Father    Stroke Father    Bone cancer Maternal Grandmother    Stomach cancer Maternal Grandfather    BP 124/80   Pulse (!) 104   Wt 125.6 kg (276 lb 12.8 oz)   SpO2 99%   BMI 37.54 kg/m   Wt Readings from Last 3 Encounters:   10/28/22 125.6 kg (276 lb 12.8 oz)  07/02/22 120.6 kg (265 lb 12.8 oz)  06/04/22 117 kg (258 lb)   PHYSICAL EXAM: General:  Well appearing. No resp difficulty HEENT: normal Neck: supple.JVP 10 Carotids 2+ bilat; no bruits. No lymphadenopathy or thryomegaly appreciated. Cor: PMI nondisplaced. Regular rate & rhythm. No rubs, gallops or murmurs. Lungs: clear Abdomen: obese  soft, nontender, nondistended. No hepatosplenomegaly. No bruits or masses. Good bowel sounds. Extremities: no cyanosis, clubbing, rash, 2-3+ edema Neuro: alert & orientedx3, cranial nerves grossly intact. moves all 4 extremities w/o difficulty. Affect pleasant   ASSESSMENT & PLAN: 1. Chronic Systolic CHF/presumed NICM: - Diagnosed February 2021 while admitted for HF in [place]. EF 10% at that time -  Echo (9/22) at [facility] EF 15-20% - Echo (10/05/21): EF < 20%, RV moderate to severely reduced, mild MR, dilated IVC - Echo 1/24 EF 20-25%  - Expect CM at least in part d/t LBBB but LBBB only 181m today so doubt CRT will help - Worse NYHA III today - Has refused cath or advanced HF w/u in past. Now willing to proceed. Will order R/L cath and cMRI - Volume overloaded add metolazone 2.5 with K 40kcl x 2 days - Refill digoxin 0.125 mg daily. - Continue carvedilol 6.25 mg bid. - Continue Lasix 40 mg bid. - Continue spironolactone 25 mg daily. - Refill Entresto 24/26 mg bid. - Refill Jardiance 10 mg daily. - Now off substance abuse. Start w/u for advanced therapies  2. LBBB - Previously I felt LBBB was contributing to cardiomyopathy but now QRS 1210mso less likely to benefit. Will review with EP   3. HTN - Stable.  - GDMT as above.   4. Polysubstance abuse - Hx heroin and meth abuse. - UDS + for amphetamines during 1/23 admission - Reports abstinence    5. Polycythemia - suspect OSA - In lab sleep study arranged.  6. Snoring - Sleep study as above.  7. SDOH - He has stable housing, transportation, and cell  phone now. - He has a scale. - He has food stamps and disability check. - Ok to proceed with advanced therapies   DaGlori BickersMD  12:35 PM

## 2022-10-28 NOTE — Patient Instructions (Addendum)
TAKE Metolazone 2.5 mg for 2 days with an 40 meq Potassium   START potassium 40 meq ( 2 Tabs ) daily   Labs done today, your results will be available in MyChart, we will contact you for abnormal readings.   Your physician has requested that you have a cardiac MRI. Cardiac MRI uses a computer to create images of your heart as its beating, producing both still and moving pictures of your heart and major blood vessels. For further information please visit [email or web address] Please follow the instruction sheet given to you today for more information. ONCE APPROVED BY YOUR INSURANCE YOU WILL BE CALLED TO HAVE THE TEST ARRANGED.   You are scheduled for a Cardiac Catheterization on Friday, February 16 with Dr. Glori Bickers.  1. Please arrive at the [facility] (Main Entrance A) at [facility]: [address] [place], [place] [address] at 2:30 PM (This time is two hours before your procedure to ensure your preparation). Free valet parking service is available.   Special note: Every effort is made to have your procedure done on time. Please understand that emergencies sometimes delay scheduled procedures.  2. Diet: Do not eat solid foods after midnight.  The patient may have clear liquids until 5am upon the day of the procedure.  3. Medication instructions in preparation for your procedure:  HOLD Lasix, Jardiance and Spironolactone the day of the procedure.  On the morning of your procedure, takeany morning medicines NOT listed above.  You may use sips of water.  5. Plan for one night stay--bring personal belongings. 6. Bring a current list of your medications and current insurance cards. 7. You MUST have a responsible person to drive you home. 8. Someone MUST be with you the first 24 hours after you arrive home or your discharge will be delayed. 9. Please wear clothes that are easy to get on and off and wear slip-on shoes.   Your physician recommends that you schedule a follow-up  appointment in: 6 weeks  If you have any questions or concerns before your next appointment please send [place] a message through [place] or call our office at [phone].    TO LEAVE A MESSAGE FOR THE NURSE SELECT OPTION 2, PLEASE LEAVE A MESSAGE INCLUDING: YOUR NAME DATE OF BIRTH CALL BACK NUMBER REASON FOR CALL**this is important as we prioritize the call backs  YOU WILL RECEIVE A CALL BACK THE SAME DAY AS LONG AS YOU CALL BEFORE 4:00 PM  At the [facility], you and your health needs are our priority. As part of our continuing mission to provide you with exceptional heart care, we have created designated Provider Care Teams. These Care Teams include your primary Cardiologist (physician) and Advanced Practice Providers (APPs- Physician Assistants and Nurse Practitioners) who all work together to provide you with the care you need, when you need it.   You may see any of the following providers on your designated Care Team at your next follow up: Dr Glori Bickers Dr Loralie Champagne Dr. Roxana Hires, NP Lyda Jester, [place] [facility] [place], [place] Forestine Na, NP Audry Riles, PharmD   Please be sure to bring in all your medications bottles to every appointment.    Thank you for choosing [facility]

## 2022-10-29 ENCOUNTER — Telehealth: Payer: Self-pay

## 2022-10-29 NOTE — Telephone Encounter (Signed)
[place], [phone], is requesting a prior auth on jardiance 10 mg tablets. Plan does not cover this medication. Please call plan at [phone] to initiate prior authorization or call pharmacy to change medication. Please address

## 2022-10-30 ENCOUNTER — Telehealth (HOSPITAL_COMMUNITY): Payer: Self-pay | Admitting: Pharmacy Technician

## 2022-10-30 ENCOUNTER — Other Ambulatory Visit (HOSPITAL_COMMUNITY): Payer: Self-pay

## 2022-10-30 NOTE — Telephone Encounter (Signed)
Patient Advocate Encounter   Received notification from Medicaid that prior authorization for Jardiance is required.   PA submitted on [place] [id number] W Status is pending   Will continue to follow.

## 2022-10-30 NOTE — Telephone Encounter (Signed)
Advanced Heart Failure Patient Advocate Encounter  Prior Authorization for Cameron Jenkins has been approved.    PA# [id number] Effective dates: 10/30/2022 - 10/30/2023  Charlann Boxer, CPhT

## 2022-11-01 ENCOUNTER — Encounter (HOSPITAL_COMMUNITY): Admission: RE | Payer: Self-pay | Source: Home / Self Care

## 2022-11-01 ENCOUNTER — Ambulatory Visit (HOSPITAL_COMMUNITY): Admission: RE | Admit: 2022-11-01 | Payer: Medicaid Other | Source: Home / Self Care | Admitting: Internal Medicine

## 2022-11-01 SURGERY — RIGHT/LEFT HEART CATH AND CORONARY ANGIOGRAPHY
Anesthesia: LOCAL

## 2022-11-08 ENCOUNTER — Telehealth (HOSPITAL_COMMUNITY): Payer: Self-pay

## 2022-11-08 NOTE — Telephone Encounter (Signed)
Called patient to reschedule catheterization. Patient still remains sick,patient to call office when feeling better.

## 2022-12-08 NOTE — H&P (View-Only) (Signed)
 ADVANCED HF CLINIC NOTE  Primary Care: Patient, No Pcp Per HF Cardiologist: Dr. Michaeal Davis  HPI:  46 y.o. male with chronic systolic CHF with EF 10% due to presumed NICM, LBBB, HTN, OSA, hx COPD, anxiety, prior tobacco use, hx noncompliance, hx polysubstance abuse.  Diagnosed with HF years ago while admitted to a hospital in [place] with CHF. No records from admission available. There was mention of possible LVAD placement and was discharged with LifeVest. He lost insurance and was not seen for follow-up.   He established care with Dr. Krasowski in 11/21. Last seen in June 2022. Admitted to psych unit in September 2022 for detox from heroin. He had episode of syncope/? Possible asystole. Had very brief CPR. He was transferred to ICU at [facility]. He was seen by Cardiology. Echo with EF 15-20%. EP considered placement of CRT-D. Defibrillator placement deferred until social situation more stable (he was homeless at the time). Did not follow-up with Cardiology after discharge.   Seen in ED at [facility] on 10/03/21 with chest tightness and dyspnea. Left before evaluated. Represented with a/c CHF. Has been out of medications 2.5 months. UDS + for amphetamines. Echo  EF < 20%, RV moderate to severely reduced, mild MR, dilated IVC. He refused cath or other HF therapies.   Follow up 9/23, NYHA II, and volume stable. Coreg started. Sleep study arranged.  Echo 1/24 EF 20-25%  RV normal   Today he returns for HF follow up. Overall feeling better. Living with a friend. Complaining of moderate fatigue with activities.  Denies PND/Orthopnea. Appetite ok. No fever or chills. Weight at home 276 pounds. Taking all medications. Using food stamps. Requires assistance with transportation. No longer using drugs or ETOH.   Significant family hx substance abuse. At one point he had been sober for 15 years.    Cardiac Studies - Echo (1/23): EF < 20%, RV moderate to severely reduced, mild MR, dilated IVC.  - Echo  (11/21) [facility] EF 15-20%  Past Medical History:  Diagnosis Date   Acute on chronic systolic congestive heart failure (HCC) 05/28/2020   Formatting of this note might be different from the original. IMO update 2021   Chest pain 05/28/2020   Congestive heart failure (CHF) (HCC) 07/21/2020   COPD (chronic obstructive pulmonary disease) (HCC)    Dilated cardiomyopathy (HCC) 07/21/2020   Dyspnea on exertion 07/21/2020   Essential hypertension 05/28/2020   Generalized anxiety disorder 05/28/2020   Heart failure (HCC)    LBBB (left bundle branch block) 05/28/2020   Nausea and vomiting 05/28/2020   Obstructive sleep apnea 07/21/2020   Pneumonia due to COVID-19 virus 05/28/2020   Formatting of this note might be different from the original. Diagnosed by SARS-CoV-2 PCR Cephid test at [facility] in [place], [place] on 05/28/2020.   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    Current Outpatient Medications  Medication Sig Dispense Refill   albuterol (PROVENTIL) (5 MG/ML) 0.5% nebulizer solution Take 2.5 mg by nebulization every 6 (six) hours as needed for wheezing or shortness of breath.     budesonide-formoterol (SYMBICORT) 160-4.5 MCG/ACT inhaler Inhale 2 puffs into the lungs 2 (two) times daily. As needed     carvedilol (COREG) 6.25 MG tablet Take 1 tablet (6.25 mg total) by mouth 2 (two) times daily. (Patient taking differently: Take 3.125 mg by mouth 2 (two) times daily.) 60 tablet 11   digoxin (LANOXIN) 0.125 MG tablet Take 1 tablet (0.125 mg total) by   mouth daily. 90 tablet 3   empagliflozin (JARDIANCE) 10 MG TABS tablet Take 1 tablet (10 mg total) by mouth daily before breakfast. 90 tablet 3   furosemide (LASIX) 20 MG tablet Take 2 tablets (40 mg total) by mouth 2 (two) times daily. 120 tablet 6   metolazone (ZAROXOLYN) 2.5 MG tablet Take 1 tablet (2.5 mg total) by mouth as directed. (Patient taking differently: Take 2.5 mg by mouth as needed.) 90 tablet 3   potassium  chloride SA (KLOR-CON M) 20 MEQ tablet Take 2 tablets (40 mEq total) by mouth 2 (two) times daily. (Patient taking differently: Take 40 mEq by mouth 2 (two) times daily. Patient takes 2 tablets by mouth once a day.) 200 tablet 5   sacubitril-valsartan (ENTRESTO) 24-26 MG Take 1 tablet by mouth 2 (two) times daily. 180 tablet 3   No current facility-administered medications for this encounter.   Allergies  Allergen Reactions   Acetaminophen Nausea Only   Social History   Socioeconomic History   Marital status: Divorced    Spouse name: Not on file   Number of children: Not on file   Years of education: Not on file   Highest education level: Not on file  Occupational History   Not on file  Tobacco Use   Smoking status: Former    Types: Cigarettes    Quit date: 07/21/2013    Years since quitting: 9.3   Smokeless tobacco: Never  Vaping Use   Vaping Use: Never used  Substance and Sexual Activity   Alcohol use: Never   Drug use: Not Currently    Types: Marijuana   Sexual activity: Not Currently  Other Topics Concern   Not on file  Social History Narrative   Not on file   Social Determinants of Health   Financial Resource Strain: High Risk (10/05/2021)   Overall Financial Resource Strain (CARDIA)    Difficulty of Paying Living Expenses: Hard  Food Insecurity: Food Insecurity Present (10/05/2021)   Hunger Vital Sign    Worried About Running Out of Food in the Last Year: Sometimes true    Ran Out of Food in the Last Year: Sometimes true  Transportation Needs: Unmet Transportation Needs (10/24/2021)   PRAPARE - Transportation    Lack of Transportation (Medical): Yes    Lack of Transportation (Non-Medical): No  Physical Activity: Not on file  Stress: Not on file  Social Connections: Not on file  Intimate Partner Violence: Not on file   Family History  Problem Relation Age of Onset   COPD Mother    Heart disease Mother    Heart attack Father    Stroke Father    Bone cancer  Maternal Grandmother    Stomach cancer Maternal Grandfather    BP 104/76   Pulse 93   Wt 125.2 kg (276 lb)   SpO2 96%   BMI 37.43 kg/m   Wt Readings from Last 3 Encounters:  12/09/22 125.2 kg (276 lb)  10/28/22 125.6 kg (276 lb 12.8 oz)  07/02/22 120.6 kg (265 lb 12.8 oz)   PHYSICAL EXAM: General:  Well appearing. No resp difficulty HEENT: normal Neck: supple. no JVD. Carotids 2+ bilat; no bruits. No lymphadenopathy or thryomegaly appreciated. Cor: PMI nondisplaced. Regular rate & rhythm. No rubs, gallops or murmurs. Lungs: clear Abdomen: obese soft, nontender, nondistended. No hepatosplenomegaly. No bruits or masses. Good bowel sounds. Extremities: no cyanosis, clubbing, rash, edema Neuro: alert & orientedx3, cranial nerves grossly intact. moves all 4 extremities w/o difficulty.   Affect pleasant   ASSESSMENT & PLAN:  1. Chronic Systolic CHF/presumed NICM: - Diagnosed February 2021 while admitted for HF in [place]. EF 10% at that time -? Related to substance abuse. - Echo (9/22) at [facility] EF 15-20% - Echo (10/05/21): EF < 20%, RV moderate to severely reduced, mild MR, dilated IVC - Echo 1/24 EF 20-25%  - Expect CM at least in part d/t LBBB but LBBB only 126ms today so doubt CRT will help - Improved. NYHA II- early III . Volume status ok  - Has refused cath or advanced HF w/u in past. Now willing to proceed as he has stable living situation.  - Arrange R/L cath - Continue digoxin 0.125 mg daily. - Continue carvedilol 6.25 mg bid. - Continue Lasix 40 mg daily  - Continue spironolactone 25 mg daily. - Continue  Entresto 24/26 mg bid. - Continue Jardiance 10 mg daily. - If cath without high-grade CAD with need cMRI  2. LBBB - Previously concerned LBBB was contributing to cardiomyopathy but now QRS 126ms so less likely to benefit.   3. HTN - Blood pressure well controlled. Continue current regimen. - GDMT as above.   4. Polysubstance abuse - Hx heroin and meth abuse.  -  UDS + for amphetamines during 1/23 admission - No longer using  5. Polycythemia - suspect OSA - In lab sleep study arranged.  6. Snoring - Sleep study as above.  7. SDOH - He has stable housing, transportation, and cell phone now. - He has food stamps and disability check. - Ok to proceed with advanced therapies   Set up cath. Check precath labs.  Danella Philson, MD  10:28 PM  

## 2022-12-08 NOTE — Progress Notes (Signed)
ADVANCED HF CLINIC NOTE  Primary Care: Patient, No Pcp Per HF Cardiologist: Dr. Haroldine Laws  HPI:  47 y.o. male with chronic systolic CHF with EF [id number] due to presumed NICM, LBBB, HTN, OSA, hx COPD, anxiety, prior tobacco use, hx noncompliance, hx polysubstance abuse.  Diagnosed with HF years ago while admitted to a hospital in [place] with CHF. No records from admission available. There was mention of possible LVAD placement and was discharged with LifeVest. He lost insurance and was not seen for follow-up.   He established care with Dr. Agustin Cree in 11/21. Last seen in June 2022. Admitted to psych unit in September 2022 for detox from heroin. He had episode of syncope/? Possible asystole. Had very brief CPR. He was transferred to ICU at [facility]. He was seen by Cardiology. Echo with EF 15-20%. EP considered placement of CRT-D. Defibrillator placement deferred until social situation more stable (he was homeless at the time). Did not follow-up with Cardiology after discharge.   Seen in ED at [facility] on 10/03/21 with chest tightness and dyspnea. Left before evaluated. Represented with a/c CHF. Has been out of medications 2.5 months. UDS + for amphetamines. Echo  EF < 20%, RV moderate to severely reduced, mild MR, dilated IVC. He refused cath or other HF therapies.   Follow up 9/23, NYHA II, and volume stable. Coreg started. Sleep study arranged.  Echo 1/24 EF 20-25%  RV normal   Today he returns for HF follow up. Overall feeling better. Living with a friend. Complaining of moderate fatigue with activities.  Denies PND/Orthopnea. Appetite ok. No fever or chills. Weight at home 276 pounds. Taking all medications. Using food stamps. Requires assistance with transportation. No longer using drugs or ETOH.   Significant family hx substance abuse. At one point he had been sober for 15 years.    Cardiac Studies - Echo (1/23): EF < 20%, RV moderate to severely reduced, mild MR, dilated IVC.  - Echo  (11/21) [facility] EF 15-20%  Past Medical History:  Diagnosis Date   Acute on chronic systolic congestive heart failure ([place]) 05/28/2020   Formatting of this note might be different from the original. IMO update 2021   Chest pain 05/28/2020   Congestive heart failure (CHF) ([place]) 07/21/2020   COPD (chronic obstructive pulmonary disease) ([place])    Dilated cardiomyopathy ([place]) 07/21/2020   Dyspnea on exertion 07/21/2020   Essential hypertension 05/28/2020   Generalized anxiety disorder 05/28/2020   Heart failure (HCC)    LBBB (left bundle branch block) 05/28/2020   Nausea and vomiting 05/28/2020   Obstructive sleep apnea 07/21/2020   Pneumonia due to COVID-19 virus 05/28/2020   Formatting of this note might be different from the original. Diagnosed by SARS-CoV-2 PCR Cephid test at [facility] in [place], [place] on 05/28/2020.   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    Current Outpatient Medications  Medication Sig Dispense Refill   albuterol (PROVENTIL) (5 MG/ML) 0.5% nebulizer solution Take 2.5 mg by nebulization every 6 (six) hours as needed for wheezing or shortness of breath.     budesonide-formoterol (SYMBICORT) 160-4.5 MCG/ACT inhaler Inhale 2 puffs into the lungs 2 (two) times daily. As needed     carvedilol (COREG) 6.25 MG tablet Take 1 tablet (6.25 mg total) by mouth 2 (two) times daily. (Patient taking differently: Take 3.125 mg by mouth 2 (two) times daily.) 60 tablet 11   digoxin (LANOXIN) 0.125 MG tablet Take 1 tablet (0.125 mg total) by  mouth daily. 90 tablet 3   empagliflozin (JARDIANCE) 10 MG TABS tablet Take 1 tablet (10 mg total) by mouth daily before breakfast. 90 tablet 3   furosemide (LASIX) 20 MG tablet Take 2 tablets (40 mg total) by mouth 2 (two) times daily. 120 tablet 6   metolazone (ZAROXOLYN) 2.5 MG tablet Take 1 tablet (2.5 mg total) by mouth as directed. (Patient taking differently: Take 2.5 mg by mouth as needed.) 90 tablet 3   potassium  chloride SA (KLOR-CON M) 20 MEQ tablet Take 2 tablets (40 mEq total) by mouth 2 (two) times daily. (Patient taking differently: Take 40 mEq by mouth 2 (two) times daily. Patient takes 2 tablets by mouth once a day.) 200 tablet 5   sacubitril-valsartan (ENTRESTO) 24-26 MG Take 1 tablet by mouth 2 (two) times daily. 180 tablet 3   No current facility-administered medications for this encounter.   Allergies  Allergen Reactions   Acetaminophen Nausea Only   Social History   Socioeconomic History   Marital status: Divorced    Spouse name: Not on file   Number of children: Not on file   Years of education: Not on file   Highest education level: Not on file  Occupational History   Not on file  Tobacco Use   Smoking status: Former    Types: Cigarettes    Quit date: 07/21/2013    Years since quitting: 9.3   Smokeless tobacco: Never  Vaping Use   Vaping Use: Never used  Substance and Sexual Activity   Alcohol use: Never   Drug use: Not Currently    Types: Marijuana   Sexual activity: Not Currently  Other Topics Concern   Not on file  Social History Narrative   Not on file   Social Determinants of Health   Financial Resource Strain: High Risk (10/05/2021)   Overall Financial Resource Strain (CARDIA)    Difficulty of Paying Living Expenses: Hard  Food Insecurity: Food Insecurity Present (10/05/2021)   Hunger Vital Sign    Worried About Running Out of Food in the Last Year: Sometimes true    Ran Out of Food in the Last Year: Sometimes true  Transportation Needs: Unmet Transportation Needs (10/24/2021)   PRAPARE - Hydrologist (Medical): Yes    Lack of Transportation (Non-Medical): No  Physical Activity: Not on file  Stress: Not on file  Social Connections: Not on file  Intimate Partner Violence: Not on file   Family History  Problem Relation Age of Onset   COPD Mother    Heart disease Mother    Heart attack Father    Stroke Father    Bone cancer  Maternal Grandmother    Stomach cancer Maternal Grandfather    BP 104/76   Pulse 93   Wt 125.2 kg (276 lb)   SpO2 96%   BMI 37.43 kg/m   Wt Readings from Last 3 Encounters:  12/09/22 125.2 kg (276 lb)  10/28/22 125.6 kg (276 lb 12.8 oz)  07/02/22 120.6 kg (265 lb 12.8 oz)   PHYSICAL EXAM: General:  Well appearing. No resp difficulty HEENT: normal Neck: supple. no JVD. Carotids 2+ bilat; no bruits. No lymphadenopathy or thryomegaly appreciated. Cor: PMI nondisplaced. Regular rate & rhythm. No rubs, gallops or murmurs. Lungs: clear Abdomen: obese soft, nontender, nondistended. No hepatosplenomegaly. No bruits or masses. Good bowel sounds. Extremities: no cyanosis, clubbing, rash, edema Neuro: alert & orientedx3, cranial nerves grossly intact. moves all 4 extremities w/o difficulty.  Affect pleasant   ASSESSMENT & PLAN:  1. Chronic Systolic CHF/presumed NICM: - Diagnosed February 2021 while admitted for HF in [place]. EF 10% at that time -? Related to substance abuse. - Echo (9/22) at [facility] EF 15-20% - Echo (10/05/21): EF < 20%, RV moderate to severely reduced, mild MR, dilated IVC - Echo 1/24 EF 20-25%  - Expect CM at least in part d/t LBBB but LBBB only 158ms today so doubt CRT will help - Improved. NYHA II- early III . Volume status ok  - Has refused cath or advanced HF w/u in past. Now willing to proceed as he has stable living situation.  - Arrange R/L cath - Continue digoxin 0.125 mg daily. - Continue carvedilol 6.25 mg bid. - Continue Lasix 40 mg daily  - Continue spironolactone 25 mg daily. - Continue  Entresto 24/26 mg bid. - Continue Jardiance 10 mg daily. - If cath without high-grade CAD with need cMRI  2. LBBB - Previously concerned LBBB was contributing to cardiomyopathy but now QRS 167ms so less likely to benefit.   3. HTN - Blood pressure well controlled. Continue current regimen. - GDMT as above.   4. Polysubstance abuse - Hx heroin and meth abuse.  -  UDS + for amphetamines during 1/23 admission - No longer using  5. Polycythemia - suspect OSA - In lab sleep study arranged.  6. Snoring - Sleep study as above.  7. SDOH - He has stable housing, transportation, and cell phone now. - He has food stamps and disability check. - Ok to proceed with advanced therapies   Set up cath. Check precath labs.  Glori Bickers, MD  10:28 PM

## 2022-12-09 ENCOUNTER — Ambulatory Visit (HOSPITAL_COMMUNITY)
Admission: RE | Admit: 2022-12-09 | Discharge: 2022-12-09 | Disposition: A | Discharge status: Home / Self Care | Payer: Medicaid Other | Source: Ambulatory Visit | Attending: Internal Medicine | Admitting: Internal Medicine

## 2022-12-09 ENCOUNTER — Encounter (HOSPITAL_COMMUNITY): Payer: Self-pay | Admitting: Internal Medicine

## 2022-12-09 ENCOUNTER — Other Ambulatory Visit (HOSPITAL_COMMUNITY): Payer: Self-pay

## 2022-12-09 VITALS — BP 104/76 | HR 93 | Wt 276.0 lb

## 2022-12-09 DIAGNOSIS — J449 Chronic obstructive pulmonary disease, unspecified: Secondary | ICD-10-CM | POA: Diagnosis not present

## 2022-12-09 DIAGNOSIS — I447 Left bundle-branch block, unspecified: Secondary | ICD-10-CM | POA: Insufficient documentation

## 2022-12-09 DIAGNOSIS — Z79899 Other long term (current) drug therapy: Secondary | ICD-10-CM | POA: Insufficient documentation

## 2022-12-09 DIAGNOSIS — G4733 Obstructive sleep apnea (adult) (pediatric): Secondary | ICD-10-CM | POA: Insufficient documentation

## 2022-12-09 DIAGNOSIS — I5022 Chronic systolic (congestive) heart failure: Secondary | ICD-10-CM | POA: Insufficient documentation

## 2022-12-09 DIAGNOSIS — F1910 Other psychoactive substance abuse, uncomplicated: Secondary | ICD-10-CM | POA: Diagnosis not present

## 2022-12-09 DIAGNOSIS — Z7984 Long term (current) use of oral hypoglycemic drugs: Secondary | ICD-10-CM | POA: Insufficient documentation

## 2022-12-09 DIAGNOSIS — D751 Secondary polycythemia: Secondary | ICD-10-CM | POA: Insufficient documentation

## 2022-12-09 DIAGNOSIS — R0683 Snoring: Secondary | ICD-10-CM | POA: Insufficient documentation

## 2022-12-09 DIAGNOSIS — I10 Essential (primary) hypertension: Secondary | ICD-10-CM | POA: Diagnosis not present

## 2022-12-09 DIAGNOSIS — I110 Hypertensive heart disease with heart failure: Secondary | ICD-10-CM | POA: Diagnosis not present

## 2022-12-09 LAB — BASIC METABOLIC PANEL
Anion gap: 11 (ref 5–15)
BUN: 19 mg/dL (ref 6–20)
CO2: 32 mmol/L (ref 22–32)
Calcium: 9.5 mg/dL (ref 8.9–10.3)
Chloride: 94 mmol/L — ABNORMAL LOW (ref 98–111)
Creatinine, Ser: 1.22 mg/dL (ref 0.61–1.24)
GFR, Estimated: >60 mL/min (ref >60)
Glucose, Bld: 132 mg/dL — ABNORMAL HIGH (ref 70–99)
Potassium: 3.3 mmol/L — ABNORMAL LOW (ref 3.5–5.1)
Sodium: 137 mmol/L (ref 135–145)

## 2022-12-09 LAB — CBC
HCT: 48.4 % (ref 39.0–52.0)
Hemoglobin: 16.4 g/dL (ref 13.0–17.0)
MCH: 29.8 pg (ref 26.0–34.0)
MCHC: 33.9 g/dL (ref 30.0–36.0)
MCV: 87.8 fL (ref 80.0–100.0)
Platelets: 219 10*3/uL (ref 150–400)
RBC: 5.51 MIL/uL (ref 4.22–5.81)
RDW: 12.3 % (ref 11.5–15.5)
WBC: 5.6 10*3/uL (ref 4.0–10.5)
nRBC: 0 % (ref 0.0–0.2)

## 2022-12-09 LAB — BRAIN NATRIURETIC PEPTIDE: B Natriuretic Peptide: 171.3 pg/mL — ABNORMAL HIGH (ref 0.0–100.0)

## 2022-12-09 NOTE — Patient Instructions (Signed)
There has been no changes to your medications.  You are scheduled for a Cardiac Catheterization on Friday, April 12 with Dr. Glori Bickers.  1. Please arrive at the [facility] (Main Entrance A) at [facility]: [address] [place], [place] [address] at 5:30 AM (This time is two hours before your procedure to ensure your preparation). Free valet parking service is available.   Special note: Every effort is made to have your procedure done on time. Please understand that emergencies sometimes delay scheduled procedures.  2. Diet: Do not eat solid foods after midnight.  The patient may have clear liquids until 5am upon the day of the procedure.  3. Medication instructions in preparation for your procedure:   Contrast Allergy: No  HOLD LASIX AND JARDIANCE THE MORNING OF THE PROCEDURE   On the morning of your procedure, take any morning medicines NOT listed above.  You may use sips of water.  5. Plan for one night stay--bring personal belongings. 6. Bring a current list of your medications and current insurance cards. 7. You MUST have a responsible person to drive you home. 8. Someone MUST be with you the first 24 hours after you arrive home or your discharge will be delayed. 9. Please wear clothes that are easy to get on and off and wear slip-on shoes.  Your physician recommends that you schedule a follow-up appointment in: 4 months ( July ) ** please call the office in May to arrange your follow up appointment. **  If you have any questions or concerns before your next appointment please send [place] a message through [place] or call our office at [phone].    TO LEAVE A MESSAGE FOR THE NURSE SELECT OPTION 2, PLEASE LEAVE A MESSAGE INCLUDING: YOUR NAME DATE OF BIRTH CALL BACK NUMBER REASON FOR CALL**this is important as we prioritize the call backs  YOU WILL RECEIVE A CALL BACK THE SAME DAY AS LONG AS YOU CALL BEFORE 4:00 PM  At the [facility], you and  your health needs are our priority. As part of our continuing mission to provide you with exceptional heart care, we have created designated Provider Care Teams. These Care Teams include your primary Cardiologist (physician) and Advanced Practice Providers (APPs- Physician Assistants and Nurse Practitioners) who all work together to provide you with the care you need, when you need it.   You may see any of the following providers on your designated Care Team at your next follow up: Dr Glori Bickers Dr Loralie Champagne Dr. Roxana Hires, NP Lyda Jester, [place] [facility] [place], [place] Forestine Na, NP Audry Riles, PharmD   Please be sure to bring in all your medications bottles to every appointment.    Thank you for choosing [facility]

## 2022-12-12 ENCOUNTER — Telehealth (HOSPITAL_COMMUNITY): Payer: Self-pay | Admitting: Cardiology

## 2022-12-12 MED ORDER — POTASSIUM CHLORIDE CRYS ER 20 MEQ PO TBCR: 60.0000 meq | EXTENDED_RELEASE_TABLET | Freq: Two times a day (BID) | ORAL | 11 refills | Status: DC

## 2022-12-12 NOTE — Addendum Note (Signed)
Addended by: Jerl Mina on: 12/12/2022 10:49 AM   Modules accepted: Orders

## 2022-12-12 NOTE — Telephone Encounter (Signed)
-----   Message from Jolaine Artist, MD sent at 12/11/2022  4:10 PM EDT ----- Increase potassium from 40 bid to 60 bid please. Repeat BMET 2 weeks.

## 2022-12-12 NOTE — Telephone Encounter (Signed)
Pt aware Pt will have [place] on 4/12 requested to have labs repeated at that time Orders added to upcoming procedure   Meds updated

## 2022-12-27 ENCOUNTER — Encounter (HOSPITAL_COMMUNITY): Payer: Self-pay | Admitting: Internal Medicine

## 2022-12-27 ENCOUNTER — Ambulatory Visit (HOSPITAL_COMMUNITY)
Admission: RE | Admit: 2022-12-27 | Discharge: 2022-12-27 | Disposition: A | Discharge status: Home / Self Care | Payer: Medicaid Other | Source: Ambulatory Visit | Attending: Internal Medicine | Admitting: Internal Medicine

## 2022-12-27 ENCOUNTER — Encounter (HOSPITAL_COMMUNITY)
Admission: RE | Disposition: A | Discharge status: Home / Self Care | Payer: Self-pay | Source: Ambulatory Visit | Attending: Internal Medicine

## 2022-12-27 ENCOUNTER — Other Ambulatory Visit: Payer: Self-pay

## 2022-12-27 DIAGNOSIS — D751 Secondary polycythemia: Secondary | ICD-10-CM | POA: Diagnosis not present

## 2022-12-27 DIAGNOSIS — I447 Left bundle-branch block, unspecified: Secondary | ICD-10-CM | POA: Diagnosis not present

## 2022-12-27 DIAGNOSIS — I110 Hypertensive heart disease with heart failure: Secondary | ICD-10-CM | POA: Diagnosis present

## 2022-12-27 DIAGNOSIS — I5022 Chronic systolic (congestive) heart failure: Secondary | ICD-10-CM | POA: Diagnosis not present

## 2022-12-27 DIAGNOSIS — Z87891 Personal history of nicotine dependence: Secondary | ICD-10-CM | POA: Insufficient documentation

## 2022-12-27 DIAGNOSIS — I420 Dilated cardiomyopathy: Secondary | ICD-10-CM | POA: Insufficient documentation

## 2022-12-27 DIAGNOSIS — I272 Pulmonary hypertension, unspecified: Secondary | ICD-10-CM | POA: Diagnosis not present

## 2022-12-27 DIAGNOSIS — R0683 Snoring: Secondary | ICD-10-CM | POA: Diagnosis not present

## 2022-12-27 DIAGNOSIS — Z79899 Other long term (current) drug therapy: Secondary | ICD-10-CM | POA: Diagnosis not present

## 2022-12-27 DIAGNOSIS — F1910 Other psychoactive substance abuse, uncomplicated: Secondary | ICD-10-CM | POA: Insufficient documentation

## 2022-12-27 DIAGNOSIS — I5023 Acute on chronic systolic (congestive) heart failure: Secondary | ICD-10-CM

## 2022-12-27 DIAGNOSIS — J449 Chronic obstructive pulmonary disease, unspecified: Secondary | ICD-10-CM | POA: Insufficient documentation

## 2022-12-27 HISTORY — PX: RIGHT/LEFT HEART CATH AND CORONARY ANGIOGRAPHY: CATH118266

## 2022-12-27 LAB — BASIC METABOLIC PANEL
Anion gap: 12 (ref 5–15)
BUN: 15 mg/dL (ref 6–20)
CO2: 30 mmol/L (ref 22–32)
Calcium: 9.3 mg/dL (ref 8.9–10.3)
Chloride: 94 mmol/L — ABNORMAL LOW (ref 98–111)
Creatinine, Ser: 1.13 mg/dL (ref 0.61–1.24)
GFR, Estimated: >60 mL/min (ref >60)
Glucose, Bld: 92 mg/dL (ref 70–99)
Potassium: 3.4 mmol/L — ABNORMAL LOW (ref 3.5–5.1)
Sodium: 136 mmol/L (ref 135–145)

## 2022-12-27 LAB — GLUCOSE, CAPILLARY
Glucose-Capillary: 98 mg/dL (ref 70–99)
Glucose-Capillary: 90 mg/dL (ref 70–99)

## 2022-12-27 LAB — POCT I-STAT EG7
Acid-Base Excess: 2 mmol/L (ref 0.0–2.0)
Bicarbonate: 27.8 mmol/L (ref 20.0–28.0)
Calcium, Ion: 0.99 mmol/L — ABNORMAL LOW (ref 1.15–1.40)
HCT: 42 % (ref 39.0–52.0)
Hemoglobin: 14.3 g/dL (ref 13.0–17.0)
O2 Saturation: 68 %
Potassium: 3 mmol/L — ABNORMAL LOW (ref 3.5–5.1)
Sodium: 142 mmol/L (ref 135–145)
TCO2: 29 mmol/L (ref 22–32)
pCO2, Ven: 46.2 mmHg (ref 44–60)
pH, Ven: 7.388 (ref 7.25–7.43)
pO2, Ven: 36 mmHg (ref 32–45)

## 2022-12-27 SURGERY — RIGHT/LEFT HEART CATH AND CORONARY ANGIOGRAPHY
Anesthesia: LOCAL

## 2022-12-27 MED ORDER — SODIUM CHLORIDE 0.9% FLUSH: 3.0000 mL | Freq: Two times a day (BID) | INTRAVENOUS | Status: DC

## 2022-12-27 MED ORDER — FENTANYL CITRATE (PF) 100 MCG/2ML IJ SOLN
INTRAMUSCULAR | Status: DC | PRN
  Administered 2022-12-27: 25 ug via INTRAVENOUS

## 2022-12-27 MED ORDER — SODIUM CHLORIDE 0.9 % IV SOLN: INTRAVENOUS | Status: DC

## 2022-12-27 MED ORDER — HEPARIN SODIUM (PORCINE) 1000 UNIT/ML IJ SOLN
INTRAMUSCULAR | Status: DC | PRN
  Administered 2022-12-27: 6000 [IU] via INTRAVENOUS

## 2022-12-27 MED ORDER — VERAPAMIL HCL 2.5 MG/ML IV SOLN
INTRAVENOUS | Status: DC | PRN
  Administered 2022-12-27: 10 mL via INTRA_ARTERIAL

## 2022-12-27 MED ORDER — SODIUM CHLORIDE 0.9 % IV SOLN: 250.0000 mL | INTRAVENOUS | Status: DC | PRN

## 2022-12-27 MED ORDER — VERAPAMIL HCL 2.5 MG/ML IV SOLN
INTRAVENOUS | Status: AC
  Filled 2022-12-27: qty 2

## 2022-12-27 MED ORDER — FENTANYL CITRATE (PF) 100 MCG/2ML IJ SOLN
INTRAMUSCULAR | Status: AC
  Filled 2022-12-27: qty 2

## 2022-12-27 MED ORDER — LIDOCAINE HCL (PF) 1 % IJ SOLN
INTRAMUSCULAR | Status: DC | PRN
  Administered 2022-12-27: 2 mL via INTRADERMAL
  Administered 2022-12-27 (×2): 5 mL via INTRADERMAL

## 2022-12-27 MED ORDER — ONDANSETRON HCL 4 MG/2ML IJ SOLN: 4.0000 mg | Freq: Four times a day (QID) | INTRAMUSCULAR | Status: DC | PRN

## 2022-12-27 MED ORDER — LABETALOL HCL 5 MG/ML IV SOLN: 10.0000 mg | INTRAVENOUS | Status: DC | PRN

## 2022-12-27 MED ORDER — HEPARIN (PORCINE) IN NACL 1000-0.9 UT/500ML-% IV SOLN
INTRAVENOUS | Status: DC | PRN
  Administered 2022-12-27 (×2): 500 mL

## 2022-12-27 MED ORDER — MIDAZOLAM HCL 2 MG/2ML IJ SOLN
INTRAMUSCULAR | Status: AC
  Filled 2022-12-27: qty 2

## 2022-12-27 MED ORDER — SODIUM CHLORIDE 0.9% FLUSH: 3.0000 mL | INTRAVENOUS | Status: DC | PRN

## 2022-12-27 MED ORDER — ACETAMINOPHEN 325 MG PO TABS: 650.0000 mg | ORAL_TABLET | ORAL | Status: DC | PRN

## 2022-12-27 MED ORDER — MIDAZOLAM HCL 2 MG/2ML IJ SOLN
INTRAMUSCULAR | Status: DC | PRN
  Administered 2022-12-27: 2 mg via INTRAVENOUS
  Administered 2022-12-27: 1 mg via INTRAVENOUS

## 2022-12-27 MED ORDER — IOHEXOL 350 MG/ML SOLN
INTRAVENOUS | Status: DC | PRN
  Administered 2022-12-27: 30 mL

## 2022-12-27 MED ORDER — LIDOCAINE HCL (PF) 1 % IJ SOLN
INTRAMUSCULAR | Status: AC
  Filled 2022-12-27: qty 30

## 2022-12-27 MED ORDER — ASPIRIN 81 MG PO CHEW
81.0000 mg | CHEWABLE_TABLET | Freq: Once | ORAL | Status: AC
  Administered 2022-12-27: 81 mg via ORAL
  Filled 2022-12-27: qty 1

## 2022-12-27 MED ORDER — HEPARIN SODIUM (PORCINE) 1000 UNIT/ML IJ SOLN
INTRAMUSCULAR | Status: AC
  Filled 2022-12-27: qty 10

## 2022-12-27 MED ORDER — HYDRALAZINE HCL 20 MG/ML IJ SOLN: 10.0000 mg | INTRAMUSCULAR | Status: DC | PRN

## 2022-12-27 SURGICAL SUPPLY — 11 items
CATH 5FR JL3.5 JR4 ANG PIG MP (CATHETERS) IMPLANT
CATH BALLN WEDGE 5F 110CM (CATHETERS) IMPLANT
DEVICE RAD COMP TR BAND LRG (VASCULAR PRODUCTS) IMPLANT
GLIDESHEATH SLEND SS 6F .021 (SHEATH) IMPLANT
GUIDEWIRE INQWIRE 1.5J.035X260 (WIRE) IMPLANT
INQWIRE 1.5J .035X260CM (WIRE) ×1
KIT MICROPUNCTURE NIT STIFF (SHEATH) IMPLANT
PACK CARDIAC CATHETERIZATION (CUSTOM PROCEDURE TRAY) ×1 IMPLANT
SHEATH GLIDE SLENDER 4/5FR (SHEATH) IMPLANT
SHEATH PROBE COVER 6X72 (BAG) IMPLANT
TRANSDUCER W/STOPCOCK (MISCELLANEOUS) ×1 IMPLANT

## 2022-12-27 NOTE — Interval H&P Note (Signed)
History and Physical Interval Note:  12/27/2022 7:41 AM  Cameron Jenkins  has presented today for surgery, with the diagnosis of Heart Failure.  The various methods of treatment have been discussed with the patient and family. After consideration of risks, benefits and other options for treatment, the patient has consented to  Procedure(s): RIGHT/LEFT HEART CATH AND CORONARY ANGIOGRAPHY (N/A) as a surgical intervention.  The patient's history has been reviewed, patient examined, no change in status, stable for surgery.  I have reviewed the patient's chart and labs.  Questions were answered to the patient's satisfaction.     Larae Caison

## 2022-12-27 NOTE — Progress Notes (Signed)
TR BAND REMOVAL  LOCATION:    right radial  DEFLATED PER PROTOCOL:    Yes.    TIME BAND OFF / DRESSING APPLIED: 12/27/22 at 1000   SITE UPON ARRIVAL:    Level 0  SITE AFTER BAND REMOVAL:    Level 0  CIRCULATION SENSATION AND MOVEMENT:    Within Normal Limits   Yes.    COMMENTS:

## 2022-12-29 LAB — POCT I-STAT 7, (LYTES, BLD GAS, ICA,H+H)
Acid-base deficit: 2 mmol/L (ref 0.0–2.0)
Bicarbonate: 22.8 mmol/L (ref 20.0–28.0)
Calcium, Ion: 0.72 mmol/L — CL (ref 1.15–1.40)
HCT: 36 % — ABNORMAL LOW (ref 39.0–52.0)
Hemoglobin: 12.2 g/dL — ABNORMAL LOW (ref 13.0–17.0)
O2 Saturation: 94 %
Potassium: 2.5 mmol/L — CL (ref 3.5–5.1)
Sodium: 147 mmol/L — ABNORMAL HIGH (ref 135–145)
TCO2: 24 mmol/L (ref 22–32)
pCO2 arterial: 36.1 mmHg (ref 32–48)
pH, Arterial: 7.408 (ref 7.35–7.45)
pO2, Arterial: 72 mmHg — ABNORMAL LOW (ref 83–108)

## 2022-12-29 LAB — POCT I-STAT EG7
Acid-Base Excess: 4 mmol/L — ABNORMAL HIGH (ref 0.0–2.0)
Bicarbonate: 29.7 mmol/L — ABNORMAL HIGH (ref 20.0–28.0)
Calcium, Ion: 1.19 mmol/L (ref 1.15–1.40)
HCT: 46 % (ref 39.0–52.0)
Hemoglobin: 15.6 g/dL (ref 13.0–17.0)
O2 Saturation: 66 %
Potassium: 3.5 mmol/L (ref 3.5–5.1)
Sodium: 138 mmol/L (ref 135–145)
TCO2: 31 mmol/L (ref 22–32)
pCO2, Ven: 49.1 mmHg (ref 44–60)
pH, Ven: 7.39 (ref 7.25–7.43)
pO2, Ven: 35 mmHg (ref 32–45)

## 2023-01-02 ENCOUNTER — Ambulatory Visit: Payer: Medicaid Other | Admitting: Family Medicine

## 2023-01-22 ENCOUNTER — Telehealth (HOSPITAL_COMMUNITY): Payer: Self-pay | Admitting: Emergency Medicine

## 2023-01-22 NOTE — Telephone Encounter (Signed)
Reaching out to patient to offer assistance regarding upcoming cardiac imaging study; pt verbalizes understanding of appt date/time, parking situation and where to check in, pre-test NPO status and medications ordered, and verified current allergies; name and call back number provided for further questions should they arise Masayo Fera RN Navigator Cardiac Imaging [facility] [phone] office [phone] cell 

## 2023-01-23 ENCOUNTER — Other Ambulatory Visit (HOSPITAL_COMMUNITY): Payer: Self-pay | Admitting: Internal Medicine

## 2023-01-23 ENCOUNTER — Ambulatory Visit (HOSPITAL_COMMUNITY)
Admission: RE | Admit: 2023-01-23 | Discharge: 2023-01-23 | Disposition: A | Discharge status: Home / Self Care | Payer: Medicaid Other | Source: Ambulatory Visit | Attending: Internal Medicine | Admitting: Internal Medicine

## 2023-01-23 DIAGNOSIS — I5022 Chronic systolic (congestive) heart failure: Secondary | ICD-10-CM

## 2023-01-23 MED ORDER — GADOBUTROL 1 MMOL/ML IV SOLN
10.0000 mL | Freq: Once | INTRAVENOUS | Status: AC | PRN
  Administered 2023-01-23: 10 mL via INTRAVENOUS

## 2023-02-03 ENCOUNTER — Telehealth (HOSPITAL_COMMUNITY): Payer: Self-pay | Admitting: Cardiology

## 2023-02-03 DIAGNOSIS — I5022 Chronic systolic (congestive) heart failure: Secondary | ICD-10-CM

## 2023-02-03 NOTE — Telephone Encounter (Signed)
Patient called.  Patient aware.    You are scheduled for a Cardiopulmonary Exercise (CPX) Test as [facility] on: Date: 02/12/23     Time:1:30pm   Expect to be in the lab for 2 hours. Please plan to arrive 30 minutes prior to your appointment. You may be asked to reschedule your test if you arrive 20 minutes or more after your scheduled appointment time.  Main Campus address: [address] [place], [place] [address] You may arrive to the Main Entrance A or Entrance C (free valet parking is available at both). -Main Entrance A (on [address]) :proceed to admitting for check in -Entrance C (on [facility]): proceed to [employer] parking or under hospital deck parking using this code ___[id number]_____  Check In: [facility] Center waiting room (1st floor)   General Instructions for the day of the test (Please follow all instructions from your physician): Refrain from ingesting a heavy meal, alcohol, or caffeine or using tobacco products within 2 hours of the test (DO NOT FAST for mare than 8 hours). You may have all other non-alcoholic, non -caffeinated beverage,a light snack (crackers,a piece of fruit, carrot sticks, toast bagel,etc) up to your appointment. Avoid significant exertion or exercise within 24 hours of your test. Be prepared to exercise and sweat. Your clothing should permit freedom of movement and include walking or running shoes. Women bring loose fitting short sleeved blouse.  This evaluation may be fatiguing and you may wish ti have someone accompany you to the assessment to drive you home afterward. Bring a list of your medications with you, including dosage and frequency you take the medications (  I.e.,once per day, twice per day, etc). Take all medications as prescribed, unless noted below or instructed to do so by your physician.  Please do not take the following medications prior to your  CPX:  _____________metolazone______________________________  _____________lasix____________________________________  Brief description of the test: A brief lung test will be performed. This will involve you taking deep breaths and blowing hard and fast through your mouth. During these , a clip will be on your nose and you will be breathing through a breathing device.   For the exercise portion of the test you will be walking on a treadmill, or riding a stationary bike, to your maximal effor or until symptoms such as chest pain, shortness of breath, leg pain or dizziness limit your exercise. You will be breathing in and out of a breathing device through your mouth (a clip will be on your nose again). Your heart rate, ECG, blood pressure, oxygen saturations, breathing rate and depth, amount of oxygen you consume and amount of carbon dioxide you produce will be measured and monitored throughout the exercise test.  If you need to cancel or reschedule your appointment please call [phone] If you have further questions please call your physician or Philip Aspen, MS, ACSM-RCEP at [phone]

## 2023-02-03 NOTE — Telephone Encounter (Signed)
-----   Message from Dolores Patty, MD sent at 02/02/2023  2:13 PM EDT ----- Severe biventricular dysfunction. Will need CPX to start w/u for advanced therapies. Please schedule CPX

## 2023-02-07 ENCOUNTER — Other Ambulatory Visit (HOSPITAL_COMMUNITY): Payer: Self-pay

## 2023-02-07 MED ORDER — EMPAGLIFLOZIN 10 MG PO TABS: 10.0000 mg | ORAL_TABLET | Freq: Every day | ORAL | 3 refills | Status: DC

## 2023-02-07 MED ORDER — DIGOXIN 125 MCG PO TABS: 0.1250 mg | ORAL_TABLET | Freq: Every day | ORAL | 3 refills | Status: DC

## 2023-02-07 NOTE — Addendum Note (Signed)
Addended by: Theresia Bough on: 02/07/2023 04:12 PM   Modules accepted: Orders

## 2023-02-12 ENCOUNTER — Ambulatory Visit (HOSPITAL_COMMUNITY): Payer: Medicaid Other | Attending: Internal Medicine

## 2023-02-12 ENCOUNTER — Other Ambulatory Visit (HOSPITAL_COMMUNITY): Payer: Self-pay | Admitting: *Deleted

## 2023-02-12 ENCOUNTER — Encounter (HOSPITAL_COMMUNITY): Payer: Self-pay | Admitting: *Deleted

## 2023-02-12 DIAGNOSIS — I5022 Chronic systolic (congestive) heart failure: Secondary | ICD-10-CM

## 2023-02-12 NOTE — Progress Notes (Signed)
Arrived for CPX with weight up 7 lbs since last visit (12/27/22) which concerned patient as he describes he is feeling much worse in the last month. Discussed with ordering provider, Dr. Gala Romney, who instructed patient to double lasix dose for 3 days and also add one dose of metolazone with 40mg  potassium chloride. Patient was able to verbally and correctly call back instructions and will follow order above.   Reggy Eye, MS, ACSM, [facility] Clinical Exercise Physiologist/ Health and Wellness Coach

## 2023-06-03 ENCOUNTER — Encounter: Payer: Self-pay | Admitting: Internal Medicine

## 2023-06-03 ENCOUNTER — Ambulatory Visit: Payer: MEDICAID | Attending: Internal Medicine | Admitting: Internal Medicine

## 2023-06-03 VITALS — BP 123/95 | HR 113 | Wt 303.6 lb

## 2023-06-03 DIAGNOSIS — I447 Left bundle-branch block, unspecified: Secondary | ICD-10-CM | POA: Diagnosis not present

## 2023-06-03 DIAGNOSIS — I2510 Atherosclerotic heart disease of native coronary artery without angina pectoris: Secondary | ICD-10-CM | POA: Diagnosis not present

## 2023-06-03 DIAGNOSIS — J449 Chronic obstructive pulmonary disease, unspecified: Secondary | ICD-10-CM | POA: Insufficient documentation

## 2023-06-03 DIAGNOSIS — Z87891 Personal history of nicotine dependence: Secondary | ICD-10-CM | POA: Diagnosis not present

## 2023-06-03 DIAGNOSIS — I5022 Chronic systolic (congestive) heart failure: Secondary | ICD-10-CM

## 2023-06-03 DIAGNOSIS — R55 Syncope and collapse: Secondary | ICD-10-CM | POA: Diagnosis present

## 2023-06-03 DIAGNOSIS — R0602 Shortness of breath: Secondary | ICD-10-CM | POA: Insufficient documentation

## 2023-06-03 DIAGNOSIS — R140 Abdominal distension (gaseous): Secondary | ICD-10-CM | POA: Insufficient documentation

## 2023-06-03 DIAGNOSIS — I5023 Acute on chronic systolic (congestive) heart failure: Secondary | ICD-10-CM | POA: Diagnosis not present

## 2023-06-03 DIAGNOSIS — Z7984 Long term (current) use of oral hypoglycemic drugs: Secondary | ICD-10-CM | POA: Insufficient documentation

## 2023-06-03 DIAGNOSIS — F1910 Other psychoactive substance abuse, uncomplicated: Secondary | ICD-10-CM | POA: Diagnosis not present

## 2023-06-03 DIAGNOSIS — Z79899 Other long term (current) drug therapy: Secondary | ICD-10-CM | POA: Insufficient documentation

## 2023-06-03 DIAGNOSIS — K625 Hemorrhage of anus and rectum: Secondary | ICD-10-CM | POA: Diagnosis not present

## 2023-06-03 DIAGNOSIS — D751 Secondary polycythemia: Secondary | ICD-10-CM | POA: Diagnosis not present

## 2023-06-03 DIAGNOSIS — I110 Hypertensive heart disease with heart failure: Secondary | ICD-10-CM | POA: Diagnosis not present

## 2023-06-03 DIAGNOSIS — R531 Weakness: Secondary | ICD-10-CM | POA: Diagnosis not present

## 2023-06-03 DIAGNOSIS — K922 Gastrointestinal hemorrhage, unspecified: Secondary | ICD-10-CM

## 2023-06-03 DIAGNOSIS — R0789 Other chest pain: Secondary | ICD-10-CM | POA: Diagnosis not present

## 2023-06-03 NOTE — Patient Instructions (Signed)
Please report to the ER at [facility] for further eval and admit to hospital

## 2023-06-03 NOTE — Progress Notes (Signed)
   06/03/23 0956  ReDS Vest / Clip  Station Marker D  Ruler Value 47  ReDS Value Range (!) > 40  ReDS Actual Value 40

## 2023-06-03 NOTE — Progress Notes (Signed)
ADVANCED HF CLINIC NOTE  Primary Care: Patient, No Pcp Per HF Cardiologist: Dr. Gala Romney  HPI:  47 y.o. male with chronic systolic CHF with EF 10% due to presumed NICM, LBBB, HTN, OSA, hx COPD, anxiety, prior tobacco use, hx noncompliance, hx polysubstance abuse.  Diagnosed with HF years ago while admitted to a hospital in [place] with CHF. No records from admission available. There was mention of possible LVAD placement and was discharged with LifeVest. He lost insurance and was not seen for follow-up.   He established care with Dr. Bing Matter in 11/21. Last seen in June 2022. Admitted to psych unit in September 2022 for detox from heroin. He had episode of syncope/? Possible asystole. Had very brief CPR. He was transferred to ICU at [facility]. He was seen by Cardiology. Echo with EF 15-20%. EP considered placement of CRT-D. Defibrillator placement deferred until social situation more stable (he was homeless at the time). Did not follow-up with Cardiology after discharge.   Seen in ED at [facility] on 10/03/21 with chest tightness and dyspnea. Left before evaluated. Represented with a/c CHF. Has been out of medications 2.5 months. UDS + for amphetamines. Echo  EF < 20%, RV moderate to severely reduced, mild MR, dilated IVC. He refused cath or other HF therapies.   Follow up 9/23, NYHA II, and volume stable. Coreg started. Sleep study arranged.  Cath 4/24: Normal cors. EF < 20% RA 8 PA 57/32 (42) PCW 17 Fick 5.5/2.2 PVR 4.5  Echo 1/24 EF 20-25%  RV normal   CPX 5/24 FVC 5.01 (85%)      FEV1 4.02 (87%)        FEV1/FVC 80 (101%)        BP rest: 126/70 Standing BP: 120/74 BP peak: 168/74  Peak VO2: 19.4 (70% predicted peak VO2) - when corrected for ibw 28.3 ml/kg/min VE/VCO2 slope:  37  Peak RER: 1.09   Today he returns for HF follow up. Main complain is BRBPR over past 2-3 weeks. Every time he has BM has a lot of blood in toilet. Feels presyncopal and pain in stomach. Only bleeding when he has  BM. Denies hemorrhoid. Feels weak. Weight up 30 pounds. + bloated. SOB with mild exertion   Significant family hx substance abuse. At one point he had been sober for 15 years.    Cardiac Studies - Echo (1/23): EF < 20%, RV moderate to severely reduced, mild MR, dilated IVC.  - Echo (11/21) [facility] EF 15-20%  Past Medical History:  Diagnosis Date   Acute on chronic systolic congestive heart failure (HCC) 05/28/2020   Formatting of this note might be different from the original. IMO update 2021   Chest pain 05/28/2020   Congestive heart failure (CHF) (HCC) 07/21/2020   COPD (chronic obstructive pulmonary disease) (HCC)    Dilated cardiomyopathy (HCC) 07/21/2020   Dyspnea on exertion 07/21/2020   Essential hypertension 05/28/2020   Generalized anxiety disorder 05/28/2020   Heart failure (HCC)    LBBB (left bundle branch block) 05/28/2020   Nausea and vomiting 05/28/2020   Obstructive sleep apnea 07/21/2020   Pneumonia due to COVID-19 virus 05/28/2020   Formatting of this note might be different from the original. Diagnosed by SARS-CoV-2 PCR Cephid test at [facility] in [place], [place] on 05/28/2020.   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    Current Outpatient Medications  Medication Sig Dispense Refill   albuterol (PROVENTIL) (5 MG/ML) 0.5% nebulizer solution Take 2.5  mg by nebulization every 6 (six) hours as needed for wheezing or shortness of breath.     budesonide-formoterol (SYMBICORT) 160-4.5 MCG/ACT inhaler Inhale 2 puffs into the lungs 2 (two) times daily. As needed     carvedilol (COREG) 3.125 MG tablet Take 3.125 mg by mouth 2 (two) times daily with a meal.     carvedilol (COREG) 6.25 MG tablet Take 1 tablet (6.25 mg total) by mouth 2 (two) times daily. 60 tablet 11   digoxin (LANOXIN) 0.125 MG tablet Take 1 tablet (0.125 mg total) by mouth daily. 90 tablet 3   empagliflozin (JARDIANCE) 10 MG TABS tablet Take 1 tablet (10 mg total) by mouth daily  before breakfast. 90 tablet 3   furosemide (LASIX) 20 MG tablet Take 2 tablets (40 mg total) by mouth 2 (two) times daily. 120 tablet 6   Menthol, Topical Analgesic, (ICY HOT EX) Apply 1 Application topically as needed (pain.).     metolazone (ZAROXOLYN) 2.5 MG tablet Take 1 tablet (2.5 mg total) by mouth as directed. (Patient taking differently: Take 2.5 mg by mouth daily as needed (fluid retention).) 90 tablet 3   potassium chloride SA (KLOR-CON M) 20 MEQ tablet Take 3 tablets (60 mEq total) by mouth 2 (two) times daily. 180 tablet 11   sacubitril-valsartan (ENTRESTO) 24-26 MG Take 1 tablet by mouth 2 (two) times daily. 180 tablet 3   spironolactone (ALDACTONE) 25 MG tablet Take 25 mg by mouth in the morning.     No current facility-administered medications for this visit.   Allergies  Allergen Reactions   Acetaminophen Nausea Only   Social History   Socioeconomic History   Marital status: Divorced    Spouse name: Not on file   Number of children: Not on file   Years of education: Not on file   Highest education level: Not on file  Occupational History   Not on file  Tobacco Use   Smoking status: Former    Current packs/day: 0.00    Types: Cigarettes    Quit date: 07/21/2013    Years since quitting: 9.8   Smokeless tobacco: Never  Vaping Use   Vaping status: Never Used  Substance and Sexual Activity   Alcohol use: Never   Drug use: Not Currently    Types: Marijuana   Sexual activity: Not Currently  Other Topics Concern   Not on file  Social History Narrative   Not on file   Social Determinants of Health   Financial Resource Strain: High Risk (10/05/2021)   Overall Financial Resource Strain (CARDIA)    Difficulty of Paying Living Expenses: Hard  Food Insecurity: Food Insecurity Present (10/05/2021)   Hunger Vital Sign    Worried About Running Out of Food in the Last Year: Sometimes true    Ran Out of Food in the Last Year: Sometimes true  Transportation Needs: Unmet  Transportation Needs (10/24/2021)   PRAPARE - Administrator, Civil Service (Medical): Yes    Lack of Transportation (Non-Medical): No  Physical Activity: Not on file  Stress: Not on file  Social Connections: Not on file  Intimate Partner Violence: Not on file   Family History  Problem Relation Age of Onset   COPD Mother    Heart disease Mother    Heart attack Father    Stroke Father    Bone cancer Maternal Grandmother    Stomach cancer Maternal Grandfather    BP (!) 123/95   Pulse (!) 113  Wt (!) 303 lb 9.6 oz (137.7 kg)   SpO2 99%   BMI 38.98 kg/m   Wt Readings from Last 3 Encounters:  06/03/23 (!) 303 lb 9.6 oz (137.7 kg)  12/27/22 275 lb (124.7 kg)  12/09/22 276 lb (125.2 kg)   PHYSICAL EXAM: General:  Weak appearing. No resp difficulty HEENT: normal Neck: supple.JVP hard to see but appears elevated Carotids 2+ bilat; no bruits. No lymphadenopathy or thryomegaly appreciated. CHE:NIDPOEU rate & rhythm. No rubs, gallops or murmurs. Lungs: clear Abdomen: obese soft, nontender, + distended. No hepatosplenomegaly. No bruits or masses. Good bowel sounds. Extremities: no cyanosis, clubbing, rash, 3+ edema Neuro: alert & orientedx3, cranial nerves grossly intact. moves all 4 extremities w/o difficulty. Affect pleasant   ASSESSMENT & PLAN:  1. Acute on chronic Systolic CHF/presumed NICM: - Diagnosed February 2021 while admitted for HF in [place]. EF 10% at that time -? Related to substance abuse. - Echo (9/22) at [facility] EF 15-20% - Echo (10/05/21): EF < 20%, RV moderate to severely reduced, mild MR, dilated IVC - Echo 1/24 EF 20-25%  - Cath 4/24: Normal cors. EF < 20% RA 8 PA 57/32 (42) PCW 17 Fick 5.5/2.2 PVR 4.5 - Expect CM at least in part d/t LBBB but LBBB only today so doubt CRT will help - CPX 5/24 pVO2: 19.4 (70% predicted peak VO2) - corrected for ibw 28.3 ml/kg/min. VE/VCO2  37  pRER: 1.09  - Markedly volume overloaded with NYHA IIIb symptoms -  ReDS 40% - Will need hospital admission for IV diuresis and w/u of GI bleeding - Home meds as below - Continue digoxin 0.125 mg daily. - Continue carvedilol 6.25 mg bid. - Continue Lasix 40 mg daily  - Continue spironolactone 25 mg daily. - Continue  Entresto 24/26 mg bid. - Continue Jardiance 10 mg daily.  2. Frequent BRBPR with presyncope and chest tightness - sound like potential hemorrhoids but he denies. Cannot exclude ischemic colitis but I feel less likely - will need CBC and GI eval   3. LBBB - Previously concerned LBBB was contributing to cardiomyopathy but now QRS so less likely to benefit.   4. HTN - Blood pressure well controlled. Continue current regimen. - GDMT as above.   5. Polysubstance abuse - Hx heroin and meth abuse.  - UDS + for amphetamines during 1/23 admission - No longer using  6. Polycythemia - suspect OSA - In lab sleep study arranged.  7. Snoring - Sleep study as above.   As above. Will need admission for IV diuresis and w/u GI bleed. Have contacted . Would request IM admission and HF team will follow. Appreciate the assistance with his care.   Arvilla Meres, MD  9:44 AM

## 2023-09-02 ENCOUNTER — Ambulatory Visit (HOSPITAL_COMMUNITY)
Admission: RE | Admit: 2023-09-02 | Discharge: 2023-09-02 | Disposition: A | Discharge status: Home / Self Care | Payer: MEDICAID | Source: Ambulatory Visit | Attending: Internal Medicine | Admitting: Internal Medicine

## 2023-09-02 VITALS — BP 148/104 | HR 119 | Ht 74.0 in | Wt 302.2 lb

## 2023-09-02 DIAGNOSIS — E8770 Fluid overload, unspecified: Secondary | ICD-10-CM | POA: Insufficient documentation

## 2023-09-02 DIAGNOSIS — Z7984 Long term (current) use of oral hypoglycemic drugs: Secondary | ICD-10-CM | POA: Insufficient documentation

## 2023-09-02 DIAGNOSIS — I5022 Chronic systolic (congestive) heart failure: Secondary | ICD-10-CM

## 2023-09-02 DIAGNOSIS — I10 Essential (primary) hypertension: Secondary | ICD-10-CM

## 2023-09-02 DIAGNOSIS — J449 Chronic obstructive pulmonary disease, unspecified: Secondary | ICD-10-CM | POA: Insufficient documentation

## 2023-09-02 DIAGNOSIS — I5023 Acute on chronic systolic (congestive) heart failure: Secondary | ICD-10-CM | POA: Insufficient documentation

## 2023-09-02 DIAGNOSIS — I447 Left bundle-branch block, unspecified: Secondary | ICD-10-CM | POA: Diagnosis not present

## 2023-09-02 DIAGNOSIS — D751 Secondary polycythemia: Secondary | ICD-10-CM | POA: Insufficient documentation

## 2023-09-02 DIAGNOSIS — J432 Centrilobular emphysema: Secondary | ICD-10-CM | POA: Diagnosis not present

## 2023-09-02 DIAGNOSIS — Z91148 Patient's other noncompliance with medication regimen for other reason: Secondary | ICD-10-CM | POA: Insufficient documentation

## 2023-09-02 DIAGNOSIS — R0683 Snoring: Secondary | ICD-10-CM | POA: Diagnosis not present

## 2023-09-02 DIAGNOSIS — I110 Hypertensive heart disease with heart failure: Secondary | ICD-10-CM | POA: Diagnosis not present

## 2023-09-02 DIAGNOSIS — Z79899 Other long term (current) drug therapy: Secondary | ICD-10-CM | POA: Insufficient documentation

## 2023-09-02 LAB — BASIC METABOLIC PANEL
Anion gap: 9 (ref 5–15)
BUN: 16 mg/dL (ref 6–20)
CO2: 23 mmol/L (ref 22–32)
Calcium: 8.7 mg/dL — ABNORMAL LOW (ref 8.9–10.3)
Chloride: 104 mmol/L (ref 98–111)
Creatinine, Ser: 0.92 mg/dL (ref 0.61–1.24)
GFR, Estimated: >60 mL/min (ref >60)
Glucose, Bld: 128 mg/dL — ABNORMAL HIGH (ref 70–99)
Potassium: 4.6 mmol/L (ref 3.5–5.1)
Sodium: 136 mmol/L (ref 135–145)

## 2023-09-02 LAB — BRAIN NATRIURETIC PEPTIDE: B Natriuretic Peptide: 709.5 pg/mL — ABNORMAL HIGH (ref 0.0–100.0)

## 2023-09-02 MED ORDER — BUDESONIDE-FORMOTEROL FUMARATE 160-4.5 MCG/ACT IN AERO: 2.0000 | INHALATION_SPRAY | Freq: Two times a day (BID) | RESPIRATORY_TRACT | 0 refills | Status: DC

## 2023-09-02 NOTE — Progress Notes (Signed)
ReDS Vest / Clip - 09/02/23 1200       ReDS Vest / Clip   Station Marker D    Ruler Value 38    ReDS Value Range Low volume    ReDS Actual Value 34

## 2023-09-02 NOTE — Patient Instructions (Signed)
Medication Changes:  No changes at this time  Refill for Symbicort has been sent in for you, further refills will need to come from your primary care provider  Lab Work:  Labs done today, your results will be available in MyChart, we will contact you for abnormal readings.   Testing/Procedures:  You are being evaluated for the REFORM=HF Study  Referrals:  none  Special Instructions // Education:  Do the following things EVERYDAY: Weigh yourself in the morning before breakfast. Write it down and keep it in a log. Take your medicines as prescribed Eat low salt foods--Limit salt (sodium) to 2000 mg per day.  Stay as active as you can everyday Limit all fluids for the day to less than 2 liters   Follow-Up in: 2 weeks   At the [facility], you and your health needs are our priority. We have a designated team specialized in the treatment of Heart Failure. This Care Team includes your primary Heart Failure Specialized Cardiologist (physician), Advanced Practice Providers (APPs- Physician Assistants and Nurse Practitioners), and Pharmacist who all work together to provide you with the care you need, when you need it.   You may see any of the following providers on your designated Care Team at your next follow up:  Dr. Arvilla Meres Dr. Marca Ancona Dr. Dorthula Nettles Dr. Theresia Bough Tonye Becket, NP Robbie Lis, [place] [facility] [place], [place] Brynda Peon, NP [place] Lee, NP Karle Plumber, PharmD   Please be sure to bring in all your medications bottles to every appointment.   Need to Contact us:  If you have any questions or concerns before your next appointment please send [place] a message through [place] or call our office at [phone].    TO LEAVE A MESSAGE FOR THE NURSE SELECT OPTION 2, PLEASE LEAVE A MESSAGE INCLUDING: YOUR NAME DATE OF BIRTH CALL BACK NUMBER REASON FOR CALL**this is important as we prioritize the call backs  YOU  WILL RECEIVE A CALL BACK THE SAME DAY AS LONG AS YOU CALL BEFORE 4:00 PM

## 2023-09-02 NOTE — Progress Notes (Addendum)
ADVANCED HF CLINIC NOTE  Primary Care: Patient, No Pcp Per HF Cardiologist: Dr. Gala Romney  HPI:  47 y.o. male with chronic systolic CHF with EF 10% due to presumed NICM, LBBB, HTN, OSA, hx COPD, anxiety, prior tobacco use, hx noncompliance, hx polysubstance abuse.  Diagnosed with HF years ago while admitted to a hospital in [place] with CHF. No records from admission available. There was mention of possible LVAD placement and was discharged with LifeVest. He lost insurance and was not seen for follow-up.   He established care with Dr. Bing Matter in 11/21. Last seen in June 2022. Admitted to psych unit in September 2022 for detox from heroin. He had episode of syncope/? Possible asystole. Had very brief CPR. He was transferred to ICU at [facility]. He was seen by Cardiology. Echo with EF 15-20%. EP considered placement of CRT-D. Defibrillator placement deferred until social situation more stable (he was homeless at the time). Did not follow-up with Cardiology after discharge.   Seen in ED at [facility] on 10/03/21 with chest tightness and dyspnea. Left before evaluated. Represented with a/c CHF. Has been out of medications 2.5 months. UDS + for amphetamines. Echo  EF < 20%, RV moderate to severely reduced, mild MR, dilated IVC. He refused cath or other HF therapies.   Follow up 9/23, NYHA II, and volume stable. Coreg started. Sleep study arranged.  Cath 4/24: Normal cors. EF < 20% RA 8 PA 57/32 (42) PCW 17 Fick 5.5/2.2 PVR 4.5  Echo 1/24 EF 20-25%  RV normal   CPX 5/24 FVC 5.01 (85%)      FEV1 4.02 (87%)        FEV1/FVC 80 (101%)        BP rest: 126/70 Standing BP: 120/74 BP peak: 168/74  Peak VO2: 19.4 (70% predicted peak VO2) - when corrected for ibw 28.3 ml/kg/min VE/VCO2 slope:  37  Peak RER: 1.09   I saw him in 06/03/23 and was markedly volume overloaded (weight up 30 pounds) and was having BRBPR. We recommended he go to the ER but he was frustrated with me and decided not to go. Remains  SOB with any activity. Weight is unchanged from previous but says hs fluid is pretty good. Ran out of symicort for several weeks and needs a refill. OVerall feels his heart is getting better but main issue is his breathing and fatigue. Says he snores heavily. Compliant with meds      Cardiac Studies - Echo (1/23): EF < 20%, RV moderate to severely reduced, mild MR, dilated IVC.  - Echo (11/21) [facility] EF 15-20%  Past Medical History:  Diagnosis Date   Acute on chronic systolic congestive heart failure (HCC) 05/28/2020   Formatting of this note might be different from the original. IMO update 2021   Chest pain 05/28/2020   Congestive heart failure (CHF) (HCC) 07/21/2020   COPD (chronic obstructive pulmonary disease) (HCC)    Dilated cardiomyopathy (HCC) 07/21/2020   Dyspnea on exertion 07/21/2020   Essential hypertension 05/28/2020   Generalized anxiety disorder 05/28/2020   Heart failure (HCC)    LBBB (left bundle branch block) 05/28/2020   Nausea and vomiting 05/28/2020   Obstructive sleep apnea 07/21/2020   Pneumonia due to COVID-19 virus 05/28/2020   Formatting of this note might be different from the original. Diagnosed by SARS-CoV-2 PCR Cephid test at [facility] in [place], [place] on 05/28/2020.   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    Current  Outpatient Medications  Medication Sig Dispense Refill   albuterol (PROVENTIL) (5 MG/ML) 0.5% nebulizer solution Take 2.5 mg by nebulization every 6 (six) hours as needed for wheezing or shortness of breath.     budesonide-formoterol (SYMBICORT) 160-4.5 MCG/ACT inhaler Inhale 2 puffs into the lungs 2 (two) times daily. As needed     digoxin (LANOXIN) 0.125 MG tablet Take 1 tablet (0.125 mg total) by mouth daily. 90 tablet 3   empagliflozin (JARDIANCE) 10 MG TABS tablet Take 1 tablet (10 mg total) by mouth daily before breakfast. 90 tablet 3   furosemide (LASIX) 20 MG tablet Take 2 tablets (40 mg total) by mouth 2  (two) times daily. 120 tablet 6   metolazone (ZAROXOLYN) 2.5 MG tablet Take 1 tablet (2.5 mg total) by mouth as directed. 90 tablet 3   potassium chloride SA (KLOR-CON M) 20 MEQ tablet Take 3 tablets (60 mEq total) by mouth 2 (two) times daily. (Patient taking differently: Take 60 mEq by mouth once.) 180 tablet 11   sacubitril-valsartan (ENTRESTO) 24-26 MG Take 1 tablet by mouth 2 (two) times daily. 180 tablet 3   spironolactone (ALDACTONE) 25 MG tablet Take 25 mg by mouth in the morning.     carvedilol (COREG) 6.25 MG tablet Take 1 tablet (6.25 mg total) by mouth 2 (two) times daily. 60 tablet 11   No current facility-administered medications for this encounter.   Allergies  Allergen Reactions   Acetaminophen Nausea Only   Social History   Socioeconomic History   Marital status: Divorced    Spouse name: Not on file   Number of children: Not on file   Years of education: Not on file   Highest education level: Not on file  Occupational History   Not on file  Tobacco Use   Smoking status: Former    Current packs/day: 0.00    Types: Cigarettes    Quit date: 07/21/2013    Years since quitting: 10.1   Smokeless tobacco: Never  Vaping Use   Vaping status: Never Used  Substance and Sexual Activity   Alcohol use: Never   Drug use: Not Currently    Types: Marijuana   Sexual activity: Not Currently  Other Topics Concern   Not on file  Social History Narrative   Not on file   Social Drivers of Health   Financial Resource Strain: High Risk (10/05/2021)   Overall Financial Resource Strain (CARDIA)    Difficulty of Paying Living Expenses: Hard  Food Insecurity: Food Insecurity Present (10/05/2021)   Hunger Vital Sign    Worried About Running Out of Food in the Last Year: Sometimes true    Ran Out of Food in the Last Year: Sometimes true  Transportation Needs: Unmet Transportation Needs (10/24/2021)   PRAPARE - Administrator, Civil Service (Medical): Yes    Lack of  Transportation (Non-Medical): No  Physical Activity: Not on file  Stress: Not on file  Social Connections: Not on file  Intimate Partner Violence: Not on file   Family History  Problem Relation Age of Onset   COPD Mother    Heart disease Mother    Heart attack Father    Stroke Father    Bone cancer Maternal Grandmother    Stomach cancer Maternal Grandfather    BP (!) 148/104   Pulse (!) 119   Ht 6\' 2"  (1.88 m)   Wt (!) 137.1 kg (302 lb 3.2 oz)   SpO2 98%   BMI 38.80 kg/m  Wt Readings from Last 3 Encounters:  09/02/23 (!) 137.1 kg (302 lb 3.2 oz)  06/03/23 (!) 137.7 kg (303 lb 9.6 oz)  12/27/22 124.7 kg (275 lb)   PHYSICAL EXAM: General:  Obese male SOB at rest HEENT: normal Neck: supple. JVP to ear . Carotids 2+ bilat; no bruits. No lymphadenopathy or thryomegaly appreciated. JJO:ACZYSAY tachy Lungs: mild basilar crackles Abdomen: obese soft, nontender, nondistended. Good bowel sounds. Extremities: no cyanosis, clubbing, rash, 2-3+ edema Neuro: alert & orientedx3, cranial nerves grossly intact. moves all 4 extremities w/o difficulty. Affect pleasant  ECG: Sinus tach 109 1VB LBBB 174 ms Personally reviewed   ASSESSMENT & PLAN:  1. Acute on chronic systolic CHF/presumed NICM: - Diagnosed February 2021 while admitted for HF in [place]. EF 10% at that time -? Related to substance abuse. - Echo (9/22) at [facility] EF 15-20% - Echo (10/05/21): EF < 20%, RV moderate to severely reduced, mild MR, dilated IVC - Echo 1/24 EF 20-25%  - Cath 4/24: Normal cors. EF < 20% RA 8 PA 57/32 (42) PCW 17 Fick 5.5/2.2 PVR 4.5 - Expect CM at least in part d/t LBBB but previously LBBB only today it is today  - CPX 5/24 pVO2: 19.4 (70% predicted peak VO2) - corrected for ibw 28.3 ml/kg/min. VE/VCO2  37  pRER: 1.09  - He is volume overloaded with NYHA IIIB-IV symptoms - ReDS 36% - Will attempt to enroll in REFORM trial for fluid removal.  - Continue digoxin 0.125 mg daily. -  Continue carvedilol 6.25 mg bid. - Continue Lasix 40 mg daily  - Continue spironolactone 25 mg daily. - Continue  Entresto 24/26 mg bid. - Continue Jardiance 10 mg daily. - I remain very concerned about him. If no significant symptom improvement with fluid removal will need RHC.  - Will need referral to EP for CRT-D  3. LBBB - Previously concerned LBBB was contributing to cardiomyopathy but QRS was only so we decided against it. Now QRS 174 ms and I think we need to consider  4. HTN - BP elevated in setting of volume overload - Reassess after diuresis   5. Polysubstance abuse - Hx heroin and meth abuse.  - UDS + for amphetamines during 1/23 admission - No longer using  6. Polycythemia - suspect OSA - Needs in lab sleeps study  7. Snoring - Needs sleep study   8. COPD - refill Symbicort  Need to follow closely after fluid removal to decide on next steps    Arvilla Meres, MD  12:19 PM

## 2023-09-03 ENCOUNTER — Other Ambulatory Visit (HOSPITAL_COMMUNITY): Payer: Self-pay

## 2023-09-03 ENCOUNTER — Ambulatory Visit (HOSPITAL_COMMUNITY)
Admission: RE | Admit: 2023-09-03 | Discharge: 2023-09-03 | Disposition: A | Discharge status: Home / Self Care | Payer: MEDICAID | Source: Ambulatory Visit | Attending: Internal Medicine | Admitting: Internal Medicine

## 2023-09-03 VITALS — BP 112/75 | HR 107 | Temp 97.2°F | Resp 18 | Wt 295.6 lb

## 2023-09-03 DIAGNOSIS — Z006 Encounter for examination for normal comparison and control in clinical research program: Secondary | ICD-10-CM

## 2023-09-03 DIAGNOSIS — I5022 Chronic systolic (congestive) heart failure: Secondary | ICD-10-CM

## 2023-09-03 LAB — CBC
HCT: 51.9 % (ref 39.0–52.0)
Hemoglobin: 16.4 g/dL (ref 13.0–17.0)
MCH: 27.8 pg (ref 26.0–34.0)
MCHC: 31.6 g/dL (ref 30.0–36.0)
MCV: 88 fL (ref 80.0–100.0)
Platelets: 258 10*3/uL (ref 150–400)
RBC: 5.9 MIL/uL — ABNORMAL HIGH (ref 4.22–5.81)
RDW: 14.4 % (ref 11.5–15.5)
WBC: 9.4 10*3/uL (ref 4.0–10.5)
nRBC: 0 % (ref 0.0–0.2)

## 2023-09-03 NOTE — Research (Addendum)
 REFORM Informed Consent   Subject Name: Cameron Jenkins  Subject met inclusion and exclusion criteria.  The informed consent form, study requirements and expectations were reviewed with the subject and questions and concerns were addressed prior to the signing of the consent form.  The subject verbalized understanding of the trial requirements.  The subject agreed to participate in the REFORM trial and signed the informed consent at 10:30 on 09/03/2023.  The informed consent was obtained prior to performance of any protocol-specific procedures for the subject.  A copy of the signed informed consent was given to the subject and a copy was placed in the subject's medical record.   Taquisha Phung     Inclusion Criteria  If any of the following is marked as 'No' the patient is not eligible for the study Yes No  Age >=21 years and < 80 years [x]  []   Subject, with known decompensated heart failure NYHA Class II, III (as evaluated by an independent observer who is not on the study team) presenting with fluid  overload, defined by a congestion score of >=3, who are not responding adequately or are resistant to current medical treatment as evidenced by persistent or worsening congestion, despite a daily dose of 80 mg furosemide  or greater or the equivalent dose of another loop diuretic. [x]  []   Subjects with no Heart Failure related hospitalization in the past 30 days [x]  []   No change in the diuretic regimen in the past 7 days [x]  []   Subjects with eGFR between 30-89 ml/min/1.11m2 []  [x]   Baseline NT-proBNP >= 600 pg/mL   Tested up to 7 days prior to the 'Control Phase' - Day 1 [x]  []   Baseline systolic blood pressure >=100 mmHg [x]  []   Subject is able and willing to provide written informed consent, inclusive of the release of medical information, and [employer] Portability and Accountability Act (HIPAA) documentation [x]  []   Subject is not participating in any clinical investigation that may  interfere with the data collection or the results of this study [x]  []     Exclusion Criteria  If any of the following is marked as 'No' the patient is not eligible for the study Yes No  Subject considered to be in the acute worsening of heart failure: Requiring ventilation, mechanical support or is clinically unstable requiring pressors, deterioration triggered by arrhythmia, infection, or other medical condition unrelated to fluid overload. []  [x]   Subject has any known or visible lower body (non-facial) skin problems (open wounds, ulcers, infections) []  [x]    Known allergy to Polyester or Thermoplastic polyurethane (TPU) []  [x]   Subject with severe peripheral arterial disease []  [x]   Subject is pregnant or planning to become pregnant within the study period, or a lactating woman. []  [x]   Subject with known hypothalamic disorders  []  [x]   Subject with known hypohidrosis disorders*  []  [x]   Subjects with medical technology dependency (gastric (G) tubes, ventilators etc.) []  [x]   Subject with cystic fibrosis []  [x]   Subject with active infections  []  [x]   Inability or unwillingness to comply with the study requirements []  [x]   Subjects with unstable electrolytes or acid-base balance (per investigator's discretion) []  [x]   Known Severe aortic valve or mitral valve stenosis  []  [x]   History of a heart transplant or actively listed for a heart transplant or LVAD []  [x]   Implanted left ventricular assist device or implant anticipated <3 months []  [x]   Subject with an active, malignant disease and whose life expectancy is < 6 months (per investigator's  discretion) []  [x]   Unstable or acutely worsening cardiac or renal conditions (per investigator's discretion) []  [x]   Does the subject meet all the eligibility criteria?    []  [x]     Sweating Tendency How would you rate your sweating? []  None    [Exclusion #7]                     []  Mild                           []  Moderate [x]  Severe                                                []  Intolerable  Does heating have a negative affect for you? [x]  No           []  Yes, specify: []  Headache tendency (if Migraine/ recurrent headaches are part of the medical history, please add to the medical history log)* []  Makes me tiered  []  Other*, specify: _____________________   *The PI should consider if the subject is eligible for enrollment.    Male Period cycle*  []  Regular (28-31 days) - *Preferable to do the visit days not during the period week []  Irregular, specify: __________________   Labs (See labs)  [x]      Subject was a screenfail due to increased eGFR.

## 2023-09-04 ENCOUNTER — Other Ambulatory Visit (HOSPITAL_COMMUNITY): Payer: Self-pay

## 2023-09-04 LAB — PRO B NATRIURETIC PEPTIDE: NT-Pro BNP: 1085 pg/mL — ABNORMAL HIGH (ref 0–121)

## 2023-09-04 NOTE — Progress Notes (Signed)
Reform lab for review

## 2023-09-05 ENCOUNTER — Encounter: Payer: Self-pay | Admitting: *Deleted

## 2023-09-05 ENCOUNTER — Encounter: Payer: MEDICAID | Admitting: *Deleted

## 2023-09-05 DIAGNOSIS — Z006 Encounter for examination for normal comparison and control in clinical research program: Secondary | ICD-10-CM

## 2023-09-05 LAB — SPECIMEN STATUS REPORT

## 2023-09-05 LAB — T4 AND TSH
T4, Total: 7.1 ug/dL (ref 4.5–12.0)
TSH: 5.98 u[IU]/mL — ABNORMAL HIGH (ref 0.450–4.500)

## 2023-09-05 NOTE — Research (Signed)
Patient called and verified that he has taken his meds this am  Patient confirmed  Took meds around 725 ish   Will see patient around 930 to start day 1 observation

## 2023-09-06 ENCOUNTER — Encounter (INDEPENDENT_AMBULATORY_CARE_PROVIDER_SITE_OTHER): Payer: Self-pay

## 2023-09-06 LAB — SODIUM, URINE, RANDOM: Sodium, Ur: 136 mmol/L

## 2023-09-06 LAB — TROPONIN T: Troponin T (Highly Sensitive): 50 ng/L (ref 0–22)

## 2023-09-07 LAB — PRO B NATRIURETIC PEPTIDE: NT-Pro BNP: 1323 pg/mL — ABNORMAL HIGH (ref 0–121)

## 2023-09-07 LAB — BASIC METABOLIC PANEL
BUN/Creatinine Ratio: 18 (ref 9–20)
BUN: 19 mg/dL (ref 6–24)
CO2: 23 mmol/L (ref 20–29)
Calcium: 9.3 mg/dL (ref 8.7–10.2)
Chloride: 100 mmol/L (ref 96–106)
Creatinine, Ser: 1.07 mg/dL (ref 0.76–1.27)
Glucose: 117 mg/dL — ABNORMAL HIGH (ref 70–99)
Potassium: 4.5 mmol/L (ref 3.5–5.2)
Sodium: 137 mmol/L (ref 134–144)
eGFR: 87 mL/min/{1.73_m2} (ref >59)

## 2023-09-08 ENCOUNTER — Telehealth (HOSPITAL_COMMUNITY): Payer: Self-pay | Admitting: *Deleted

## 2023-09-08 NOTE — Telephone Encounter (Signed)
Called pt to f/u as he was fluid overload last week and did not meet criteria for REFORM-HF.  Pt states he is feeling better and wt is down 303-->289 lb today, he states he still SOB w/exertion, he has been taking Lasix 40 mg BID, he will take 1 dose of metolazone today, he is sch for f/u Fri 12/27 and will keep that appt.

## 2023-09-12 ENCOUNTER — Encounter (HOSPITAL_COMMUNITY): Payer: Self-pay | Admitting: Internal Medicine

## 2023-09-12 ENCOUNTER — Ambulatory Visit (HOSPITAL_COMMUNITY)
Admission: RE | Admit: 2023-09-12 | Discharge: 2023-09-12 | Disposition: A | Discharge status: Home / Self Care | Payer: MEDICAID | Source: Ambulatory Visit | Attending: Internal Medicine | Admitting: Internal Medicine

## 2023-09-12 ENCOUNTER — Other Ambulatory Visit (HOSPITAL_COMMUNITY): Payer: Self-pay | Admitting: Family Medicine

## 2023-09-12 VITALS — BP 130/92 | HR 101 | Ht 74.0 in | Wt 301.0 lb

## 2023-09-12 DIAGNOSIS — I420 Dilated cardiomyopathy: Secondary | ICD-10-CM

## 2023-09-12 DIAGNOSIS — I5023 Acute on chronic systolic (congestive) heart failure: Secondary | ICD-10-CM | POA: Diagnosis not present

## 2023-09-12 DIAGNOSIS — Z87891 Personal history of nicotine dependence: Secondary | ICD-10-CM | POA: Diagnosis not present

## 2023-09-12 DIAGNOSIS — I110 Hypertensive heart disease with heart failure: Secondary | ICD-10-CM | POA: Diagnosis not present

## 2023-09-12 DIAGNOSIS — Z91148 Patient's other noncompliance with medication regimen for other reason: Secondary | ICD-10-CM | POA: Diagnosis not present

## 2023-09-12 DIAGNOSIS — D751 Secondary polycythemia: Secondary | ICD-10-CM | POA: Insufficient documentation

## 2023-09-12 DIAGNOSIS — Z7984 Long term (current) use of oral hypoglycemic drugs: Secondary | ICD-10-CM | POA: Insufficient documentation

## 2023-09-12 DIAGNOSIS — R5383 Other fatigue: Secondary | ICD-10-CM | POA: Diagnosis not present

## 2023-09-12 DIAGNOSIS — F1910 Other psychoactive substance abuse, uncomplicated: Secondary | ICD-10-CM | POA: Insufficient documentation

## 2023-09-12 DIAGNOSIS — R0683 Snoring: Secondary | ICD-10-CM

## 2023-09-12 DIAGNOSIS — I447 Left bundle-branch block, unspecified: Secondary | ICD-10-CM | POA: Diagnosis not present

## 2023-09-12 DIAGNOSIS — J449 Chronic obstructive pulmonary disease, unspecified: Secondary | ICD-10-CM | POA: Diagnosis not present

## 2023-09-12 DIAGNOSIS — Z79899 Other long term (current) drug therapy: Secondary | ICD-10-CM | POA: Insufficient documentation

## 2023-09-12 DIAGNOSIS — I10 Essential (primary) hypertension: Secondary | ICD-10-CM

## 2023-09-12 DIAGNOSIS — R0600 Dyspnea, unspecified: Secondary | ICD-10-CM | POA: Diagnosis present

## 2023-09-12 LAB — BRAIN NATRIURETIC PEPTIDE: B Natriuretic Peptide: 422.4 pg/mL — ABNORMAL HIGH (ref 0.0–100.0)

## 2023-09-12 MED ORDER — TORSEMIDE 20 MG PO TABS: 40.0000 mg | ORAL_TABLET | Freq: Two times a day (BID) | ORAL | 3 refills | Status: DC

## 2023-09-12 MED ORDER — POTASSIUM CHLORIDE CRYS ER 20 MEQ PO TBCR: 60.0000 meq | EXTENDED_RELEASE_TABLET | Freq: Two times a day (BID) | ORAL | Status: DC

## 2023-09-12 NOTE — Progress Notes (Signed)
ADVANCED HF CLINIC NOTE  Primary Care: Patient, No Pcp Per HF Cardiologist: Dr. Gala Romney  HPI:  47 y.o. male with chronic systolic CHF with EF 10% due to presumed NICM, LBBB, HTN, OSA, hx COPD, anxiety, prior tobacco use, hx noncompliance, hx polysubstance abuse.  Diagnosed with HF years ago while admitted to a hospital in [place] with CHF. No records from admission available. There was mention of possible LVAD placement and was discharged with LifeVest. He lost insurance and was not seen for follow-up.   He established care with Dr. Bing Matter in 11/21. Last seen in June 2022. Admitted to psych unit in September 2022 for detox from heroin. He had episode of syncope/? Possible asystole. Had very brief CPR. He was transferred to ICU at [facility]. He was seen by Cardiology. Echo with EF 15-20%. EP considered placement of CRT-D. Defibrillator placement deferred until social situation more stable (he was homeless at the time). Did not follow-up with Cardiology after discharge.   Seen in ED at [facility] on 10/03/21 with chest tightness and dyspnea. Left before evaluated. Represented with a/c CHF. Has been out of medications 2.5 months. UDS + for amphetamines. Echo  EF < 20%, RV moderate to severely reduced, mild MR, dilated IVC. He refused cath or other HF therapies.   Follow up 9/23, NYHA II, and volume stable. Coreg started. Sleep study arranged.  Cath 4/24: Normal cors. EF < 20% RA 8 PA 57/32 (42) PCW 17 Fick 5.5/2.2 PVR 4.5  Echo 1/24 EF 20-25%  RV normal   CPX 5/24 FVC 5.01 (85%)      FEV1 4.02 (87%)        FEV1/FVC 80 (101%)        BP rest: 126/70 Standing BP: 120/74 BP peak: 168/74  Peak VO2: 19.4 (70% predicted peak VO2) - when corrected for ibw 28.3 ml/kg/min VE/VCO2 slope:  37  Peak RER: 1.09   Seen 06/03/23 and was markedly volume overloaded (weight up 30 pounds) and was having BRBPR. We recommended he go to the ER but he was frustrated with and decided not to go. Volume overloaded at  follow-up 12/17. ReDS 36%. Lasix increased to 40 BID and took 2.5 mg metolazone. Did not meet criteria for REFORM trial.  Here today for close CHF follow-up. Lower extremity edema improved after he took a dose of metolazone but did not notice much change in UOP with higher dose of lasix. Still complaining of dyspnea with exertion and fatigue. Taking medications as prescribed.      Cardiac Studies - Echo (1/23): EF < 20%, RV moderate to severely reduced, mild MR, dilated IVC.  - Echo (11/21) [facility] EF 15-20%  Past Medical History:  Diagnosis Date   Acute on chronic systolic congestive heart failure (HCC) 05/28/2020   Formatting of this note might be different from the original. IMO update 2021   Chest pain 05/28/2020   Congestive heart failure (CHF) (HCC) 07/21/2020   COPD (chronic obstructive pulmonary disease) (HCC)    Dilated cardiomyopathy (HCC) 07/21/2020   Dyspnea on exertion 07/21/2020   Essential hypertension 05/28/2020   Generalized anxiety disorder 05/28/2020   Heart failure (HCC)    LBBB (left bundle branch block) 05/28/2020   Nausea and vomiting 05/28/2020   Obstructive sleep apnea 07/21/2020   Pneumonia due to COVID-19 virus 05/28/2020   Formatting of this note might be different from the original. Diagnosed by SARS-CoV-2 PCR Cephid test at [facility] in [place], [place] on 05/28/2020.   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     budesonide-formoterol (SYMBICORT) 160-4.5 MCG/ACT inhaler Inhale 2 puffs into the lungs 2 (two) times daily. As needed 1 each 0   digoxin (LANOXIN) 0.125 MG tablet Take 1 tablet (0.125 mg total) by mouth daily. 90 tablet 3   empagliflozin (JARDIANCE) 10 MG TABS tablet Take 1 tablet (10 mg total) by mouth  daily before breakfast. 90 tablet 3   furosemide (LASIX) 20 MG tablet Take 2 tablets (40 mg total) by mouth 2 (two) times daily. 120 tablet 6   metolazone (ZAROXOLYN) 2.5 MG tablet Take 1 tablet (2.5 mg total) by mouth as directed. 90 tablet 3   potassium chloride SA (KLOR-CON M) 20 MEQ tablet Take 3 tablets (60 mEq total) by mouth 2 (two) times daily. (Patient taking differently: Take 60 mEq by mouth once.) 180 tablet 11   sacubitril-valsartan (ENTRESTO) 24-26 MG Take 1 tablet by mouth 2 (two) times daily. 180 tablet 3   spironolactone (ALDACTONE) 25 MG tablet Take 25 mg by mouth in the morning.     carvedilol (COREG) 6.25 MG tablet Take 1 tablet (6.25 mg total) by mouth 2 (two) times daily. 60 tablet 11   No current facility-administered medications for this encounter.   Allergies  Allergen Reactions   Acetaminophen Nausea Only   Social History   Socioeconomic History   Marital status: Divorced    Spouse name: Not on file   Number of children: Not on file   Years of education: Not on file   Highest education level: Not on file  Occupational History   Not on file  Tobacco Use   Smoking status: Former    Current packs/day: 0.00    Types: Cigarettes    Quit date: 07/21/2013    Years since quitting: 10.1   Smokeless tobacco: Never  Vaping Use   Vaping status: Never Used  Substance and Sexual Activity   Alcohol use: Never   Drug use: Not Currently    Types: Marijuana   Sexual activity: Not Currently  Other Topics Concern   Not on file  Social History Narrative   Not on file   Social Drivers of Health   Financial Resource Strain: High Risk (10/05/2021)   Overall Financial Resource Strain (CARDIA)    Difficulty of Paying Living Expenses: Hard  Food Insecurity: Food Insecurity Present (10/05/2021)   Hunger Vital Sign    Worried About Running Out of Food in the Last Year: Sometimes true    Ran Out of Food in the Last Year: Sometimes true  Transportation Needs: Unmet  Transportation Needs (10/24/2021)   PRAPARE - Administrator, Civil Service (Medical): Yes    Lack of Transportation (Non-Medical): No  Physical Activity: Not on file  Stress: Not on file  Social Connections: Not on file  Intimate Partner Violence: Not on file   Family History  Problem Relation Age of Onset   COPD Mother    Heart disease Mother    Heart attack Father    Stroke Father    Bone cancer Maternal Grandmother    Stomach cancer Maternal Grandfather    BP (!) 130/92   Pulse (!) 101   Ht 6\' 2"  (1.88 m)   Wt (!) 136.5  kg (301 lb)   SpO2 97%   BMI 38.65 kg/m   Wt Readings from Last 3 Encounters:  09/12/23 (!) 136.5 kg (301 lb)  09/02/23 (!) 137.1 kg (302 lb 3.2 oz)  06/03/23 (!) 137.7 kg (303 lb 9.6 oz)   PHYSICAL EXAM: General:  Conversational dyspnea.  HEENT: normal Neck: supple. JVP to jaw.  Cor: PMI nondisplaced. Regular rate & rhythm, tachy. No rubs, gallops or murmurs. Lungs: clear Abdomen: obese, soft, nontender, nondistended.  Extremities: no cyanosis, clubbing, rash, 2+ edema Neuro: alert & orientedx3. moves all 4 extremities w/o difficulty. Affect pleasant  ReDS 39%    ASSESSMENT & PLAN: 1. Acute on chronic systolic CHF/presumed NICM: - Diagnosed February 2021 while admitted for HF in [place]. EF 10% at that time -? Related to substance abuse. - Echo (9/22) at [facility] EF 15-20% - Echo (10/05/21): EF < 20%, RV moderate to severely reduced, mild MR, dilated IVC - Echo 1/24 EF 20-25%  - Cath 4/24: Normal cors. EF < 20% RA 8 PA 57/32 (42) PCW 17 Fick 5.5/2.2 PVR 4.5 - Expect CM at least in part d/t LBBB but previously LBBB only today it is today  - CPX 5/24 pVO2: 19.4 (70% predicted peak VO2) - corrected for ibw 28.3 ml/kg/min. VE/VCO2  37  pRER: 1.09  - He is volume overloaded with NYHA IIIB-IV symptoms. ReDS 39%.  - Switch lasix to Torsemide 40 BID. Can take metolazone 2.5 mg x 1 tomorrow, may need once a week.  - Continue  digoxin 0.125 mg daily. - Continue carvedilol 6.25 mg bid. - Continue spironolactone 25 mg daily. - Continue  Entresto 24/26 mg bid. - Continue Jardiance 10 mg daily. - CMET/BNP today, BMET 1 week - I remain very concerned about him. If no significant symptom improvement with fluid removal will need RHC.  - Will need referral to EP for CRT-D  3. LBBB - Previously concerned LBBB was contributing to cardiomyopathy but QRS was only so we decided against it. Now QRS 174 ms (on ECG 12/17) and I think we need to consider.  4. HTN - BP elevated in setting of volume overload - Reassess after diuresis   5. Polysubstance abuse - Hx heroin and meth abuse.  - UDS + for amphetamines during 1/23 admission - No longer using  6. Polycythemia - suspect OSA - Sleep study ordered  7. Snoring - Sleep study pending   8. COPD   Follow-up: 2 weeks   Karliah Kowalchuk N, PA-C  12:22 PM  Patient seen and examined with the above-signed Advanced Practice Provider and/or Housestaff. I personally reviewed laboratory data, imaging studies and relevant notes. I independently examined the patient and formulated the important aspects of the plan. I have edited the note to reflect any of my changes or salient points. I have personally discussed the plan with the patient and/or family.  47 y/o male with a/c systolic HF due to NICM. Volume status on way back up,. NYHA III. Remains tachycardic but somewhat improved  General:  Walked into clinic. No resp difficulty HEENT: normal Neck: supple.JVP jaw . Carotids 2+ bilat; no bruits. No lymphadenopathy or thryomegaly appreciated. Cor: PMI nondisplaced. Regular tachy No rubs, gallops or murmurs. Lungs: clear Abdomen: soft, nontender, nondistended. No hepatosplenomegaly. No bruits or masses. Good bowel sounds. Extremities: no cyanosis, clubbing, rash, 2+ edema Neuro: alert & orientedx3, cranial nerves grossly intact. moves all 4 extremities w/o difficulty.  Affect pleasant  He has progressive volume overload. Agree  with switching lasix to torsemide. Check labs. Will see back next week. Consider addition of ivabradine. Schedule in-lab sleep study (once he has [facility] can switch to home sleep study if not already complete.)  Arvilla Meres, MD  10:34 PM

## 2023-09-12 NOTE — Patient Instructions (Signed)
Great to see you today!!!  Medication Changes:  STOP Furosemide  START Torsemide 40 mg (2 tabs) Twice daily   Take Metolazone TODAY Take extra Potassium 40 meq (2 tabs) when you take Metolazone  Lab Work:  Labs done today, your results will be available in MyChart, we will contact you for abnormal readings.  Special Instructions // Education:  Do the following things EVERYDAY: Weigh yourself in the morning before breakfast. Write it down and keep it in a log. Take your medicines as prescribed Eat low salt foods--Limit salt (sodium) to 2000 mg per day.  Stay as active as you can everyday Limit all fluids for the day to less than 2 liters   Follow-Up in: 2 weeks   At the [facility], you and your health needs are our priority. We have a designated team specialized in the treatment of Heart Failure. This Care Team includes your primary Heart Failure Specialized Cardiologist (physician), Advanced Practice Providers (APPs- Physician Assistants and Nurse Practitioners), and Pharmacist who all work together to provide you with the care you need, when you need it.   You may see any of the following providers on your designated Care Team at your next follow up:  Dr. Arvilla Meres Dr. Marca Ancona Dr. Dorthula Nettles Dr. Theresia Bough Tonye Becket, NP Robbie Lis, [place] [facility], [place] Brynda Peon, NP [place] Lee, NP Karle Plumber, PharmD   Please be sure to bring in all your medications bottles to every appointment.   Need to Contact us:  If you have any questions or concerns before your next appointment please send [place] a message through [place] or call our office at [phone].    TO LEAVE A MESSAGE FOR THE NURSE SELECT OPTION 2, PLEASE LEAVE A MESSAGE INCLUDING: YOUR NAME DATE OF BIRTH CALL BACK NUMBER REASON FOR CALL**this is important as we prioritize the call backs  YOU WILL RECEIVE A CALL BACK THE SAME DAY AS LONG AS YOU CALL  BEFORE 4:00 PM

## 2023-09-20 IMAGING — CR DG CHEST 2V
2 series · 2 of 2 positions shown · non-contrast
Comparison: 10/03/2021

CLINICAL DATA: Shortness of breath

EXAM:
CHEST - 2 VIEW

[chest pa]
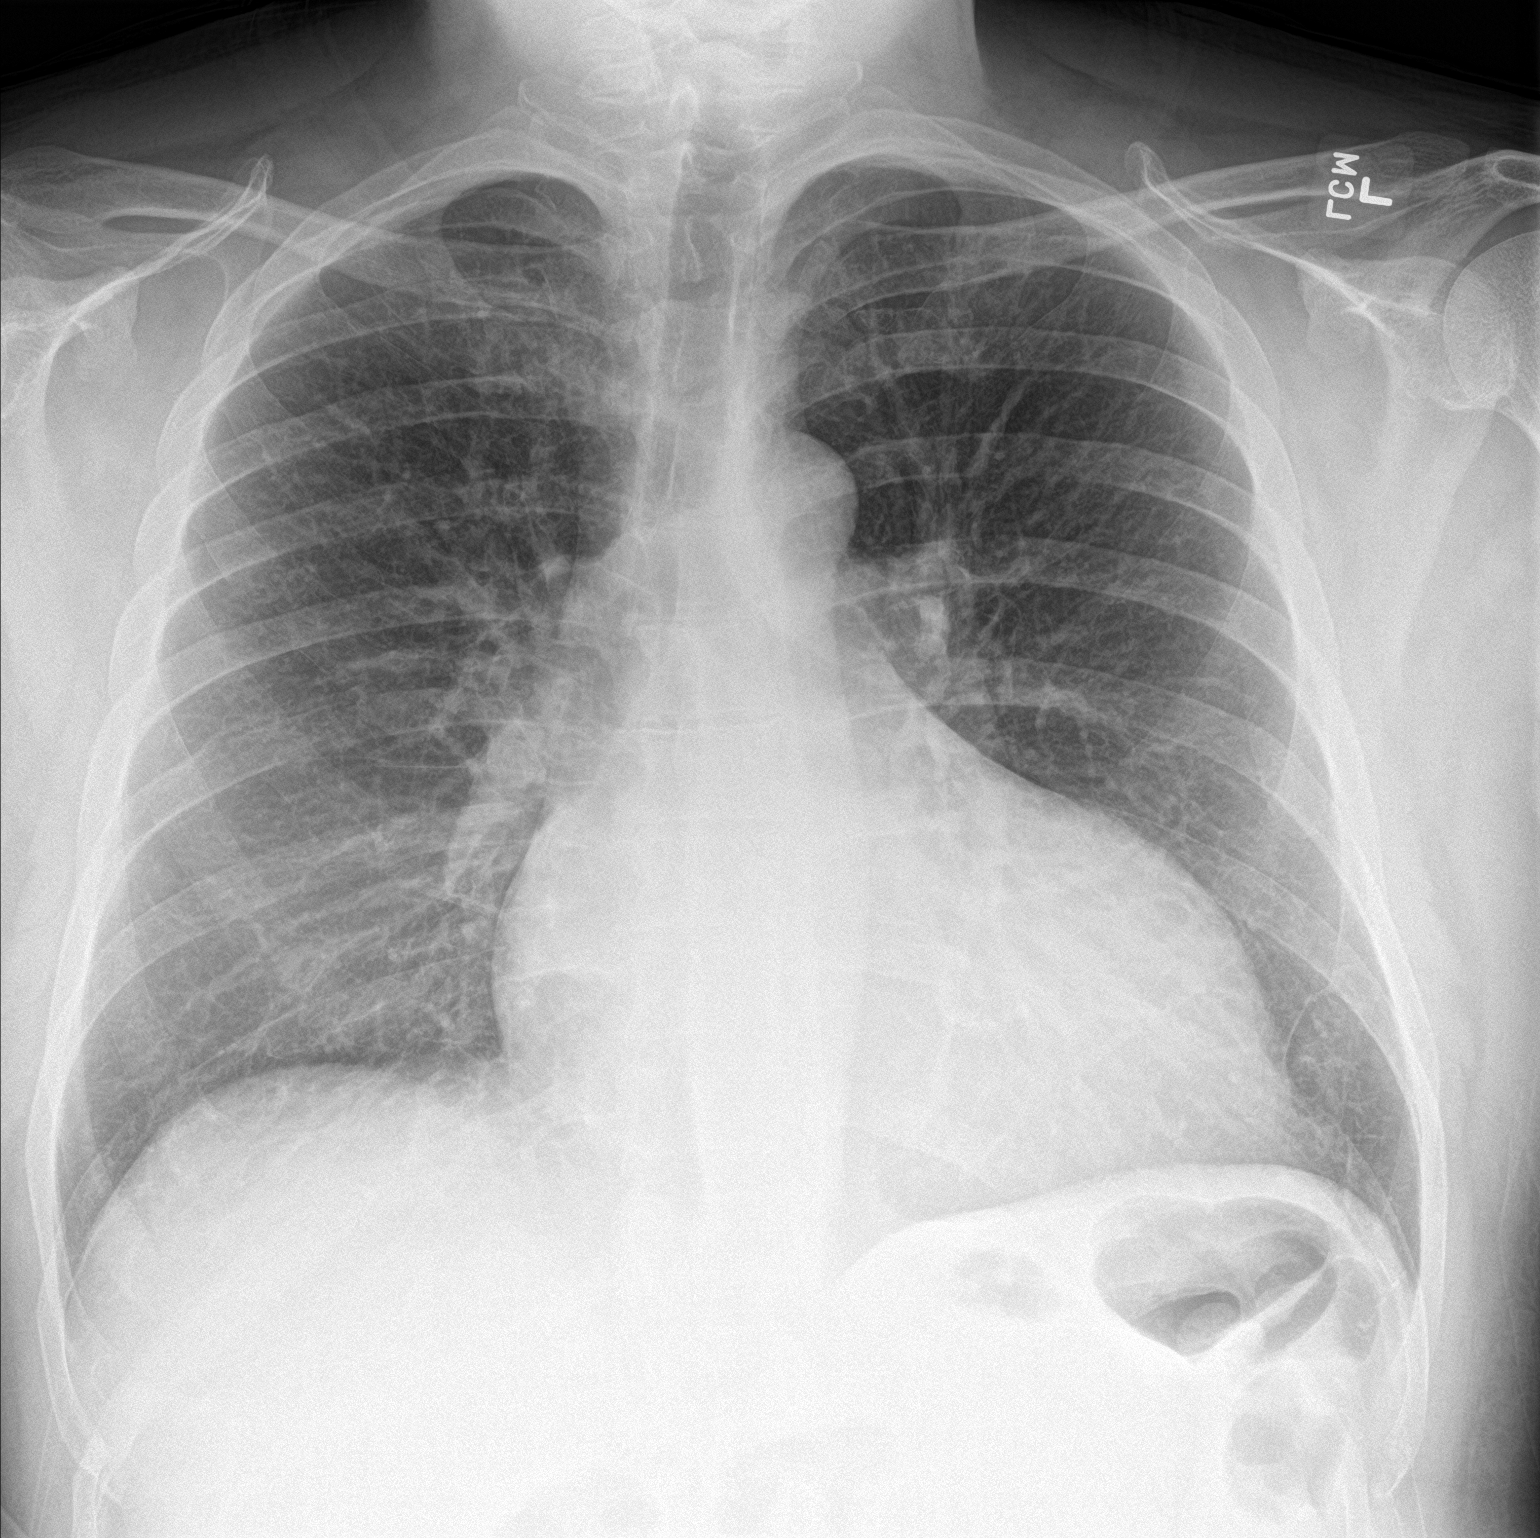

[chest lat]
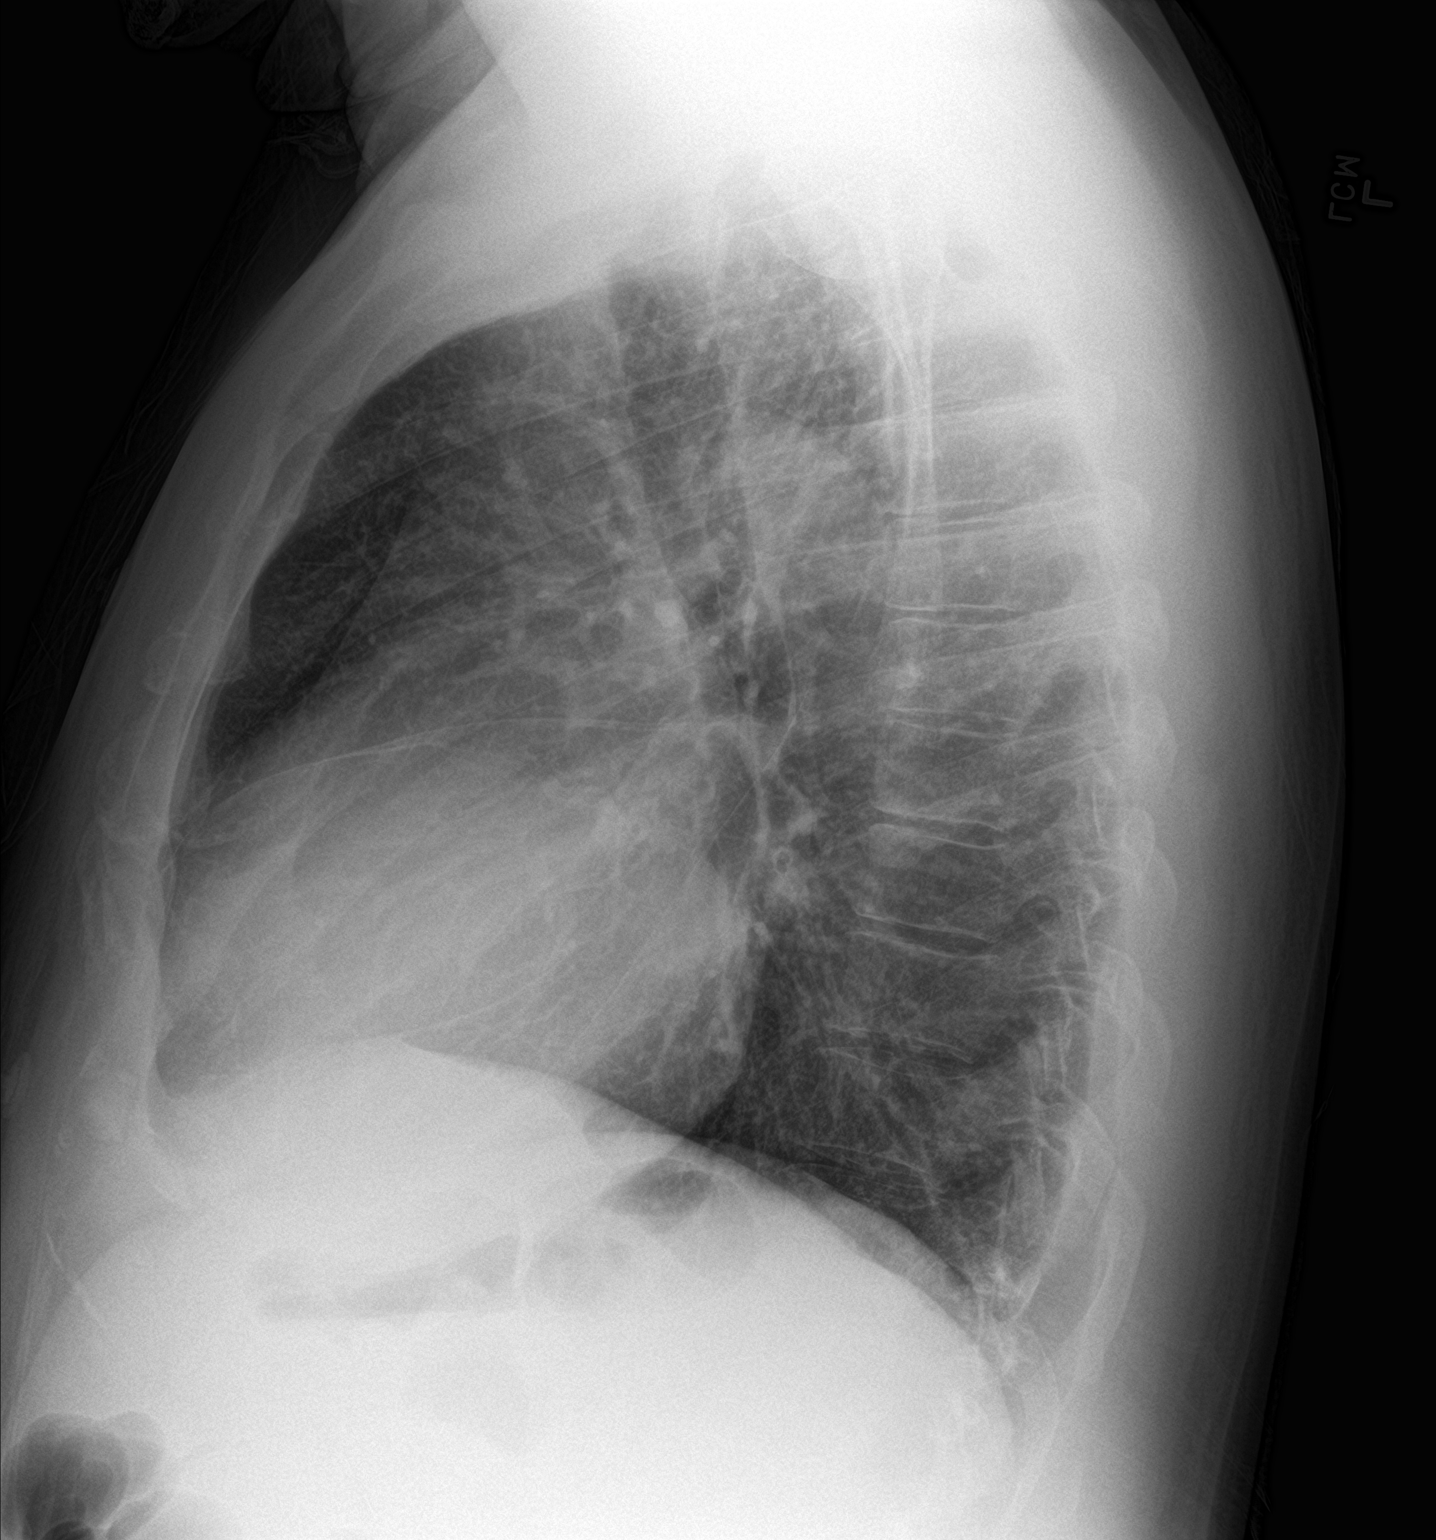

[2 of 2 positions shown; findings below may reference images not displayed]

FINDINGS: Stable cardiac enlargement. Could not exclude pericardial effusion.
The mediastinal and hilar contours are within normal limits.

The lungs are clear. No infiltrates or effusions. No pulmonary
lesions. No pneumothorax. The bony thorax is intact.
IMPRESSION: 1. Stable cardiac enlargement. Could not exclude pericardial
effusion.
2. No infiltrates or effusions.

## 2023-09-21 ENCOUNTER — Encounter (INDEPENDENT_AMBULATORY_CARE_PROVIDER_SITE_OTHER): Payer: Self-pay

## 2023-09-22 ENCOUNTER — Encounter (HOSPITAL_COMMUNITY): Payer: Self-pay

## 2023-09-22 ENCOUNTER — Ambulatory Visit (HOSPITAL_COMMUNITY)
Admission: RE | Admit: 2023-09-22 | Discharge: 2023-09-22 | Disposition: A | Discharge status: Home / Self Care | Payer: MEDICAID | Source: Ambulatory Visit | Attending: Cardiology | Admitting: Cardiology

## 2023-09-22 ENCOUNTER — Other Ambulatory Visit (HOSPITAL_COMMUNITY): Payer: Self-pay

## 2023-09-22 VITALS — BP 110/82 | HR 86 | Ht 74.0 in | Wt 292.6 lb

## 2023-09-22 DIAGNOSIS — R0683 Snoring: Secondary | ICD-10-CM | POA: Diagnosis not present

## 2023-09-22 DIAGNOSIS — R9431 Abnormal electrocardiogram [ECG] [EKG]: Secondary | ICD-10-CM | POA: Diagnosis not present

## 2023-09-22 DIAGNOSIS — G4733 Obstructive sleep apnea (adult) (pediatric): Secondary | ICD-10-CM | POA: Diagnosis not present

## 2023-09-22 DIAGNOSIS — I447 Left bundle-branch block, unspecified: Secondary | ICD-10-CM | POA: Diagnosis not present

## 2023-09-22 DIAGNOSIS — F1911 Other psychoactive substance abuse, in remission: Secondary | ICD-10-CM | POA: Insufficient documentation

## 2023-09-22 DIAGNOSIS — I428 Other cardiomyopathies: Secondary | ICD-10-CM | POA: Insufficient documentation

## 2023-09-22 DIAGNOSIS — R5383 Other fatigue: Secondary | ICD-10-CM | POA: Diagnosis not present

## 2023-09-22 DIAGNOSIS — I5022 Chronic systolic (congestive) heart failure: Secondary | ICD-10-CM | POA: Diagnosis present

## 2023-09-22 DIAGNOSIS — F419 Anxiety disorder, unspecified: Secondary | ICD-10-CM | POA: Insufficient documentation

## 2023-09-22 DIAGNOSIS — D751 Secondary polycythemia: Secondary | ICD-10-CM | POA: Diagnosis not present

## 2023-09-22 DIAGNOSIS — Z87891 Personal history of nicotine dependence: Secondary | ICD-10-CM | POA: Insufficient documentation

## 2023-09-22 DIAGNOSIS — J449 Chronic obstructive pulmonary disease, unspecified: Secondary | ICD-10-CM | POA: Insufficient documentation

## 2023-09-22 DIAGNOSIS — I110 Hypertensive heart disease with heart failure: Secondary | ICD-10-CM | POA: Diagnosis present

## 2023-09-22 LAB — TSH: TSH: 1.841 u[IU]/mL (ref 0.350–4.500)

## 2023-09-22 LAB — IRON AND TIBC
Iron: 98 ug/dL (ref 45–182)
Saturation Ratios: 17 % — ABNORMAL LOW (ref 17.9–39.5)
TIBC: 595 ug/dL — ABNORMAL HIGH (ref 250–450)
UIBC: 497 ug/dL

## 2023-09-22 LAB — COMPREHENSIVE METABOLIC PANEL
ALT: 28 U/L (ref 0–44)
AST: 39 U/L (ref 15–41)
Albumin: 3.5 g/dL (ref 3.5–5.0)
Alkaline Phosphatase: 79 U/L (ref 38–126)
Anion gap: 13 (ref 5–15)
BUN: 16 mg/dL (ref 6–20)
CO2: 28 mmol/L (ref 22–32)
Calcium: 9.2 mg/dL (ref 8.9–10.3)
Chloride: 96 mmol/L — ABNORMAL LOW (ref 98–111)
Creatinine, Ser: 1.22 mg/dL (ref 0.61–1.24)
GFR, Estimated: >60 mL/min (ref >60)
Glucose, Bld: 93 mg/dL (ref 70–99)
Potassium: 4.3 mmol/L (ref 3.5–5.1)
Sodium: 137 mmol/L (ref 135–145)
Total Bilirubin: 1.1 mg/dL (ref 0.0–1.2)
Total Protein: 8.1 g/dL (ref 6.5–8.1)

## 2023-09-22 LAB — T4, FREE: Free T4: 1 ng/dL (ref 0.61–1.12)

## 2023-09-22 LAB — DIGOXIN LEVEL: Digoxin Level: 0.5 ng/mL — ABNORMAL LOW (ref 0.8–2.0)

## 2023-09-22 LAB — BRAIN NATRIURETIC PEPTIDE: B Natriuretic Peptide: 371.9 pg/mL — ABNORMAL HIGH (ref 0.0–100.0)

## 2023-09-22 LAB — FERRITIN: Ferritin: 54 ng/mL (ref 24–336)

## 2023-09-22 NOTE — Progress Notes (Signed)
 ReDS Vest / Clip - 09/22/23 1343       ReDS Vest / Clip   Station Marker D    Ruler Value 38    ReDS Value Range Moderate volume overload    ReDS Actual Value 36

## 2023-09-22 NOTE — Patient Instructions (Signed)
 Medication Changes:  No Changes In Medications at this time.   Lab Work:  Labs done today, your results will be available in MyChart, we will contact you for abnormal readings.  Referrals:  YOU HAVE BEEN REFERRED TO ELECTROPHYSIOLOGY THEY WILL REACH OUT TO YOU OR CALL TO ARRANGE THIS. PLEASE CALL US  WITH ANY CONCERNS   Special Instructions // Education:   [facility] [address] [place] [address] Dept: [phone] Loc: [phone]  Cameron Jenkins  09/22/2023  You are scheduled for a Cardiac Catheterization on Thursday, January 30 with Dr. Toribio Fuel.  1. Please arrive at the [facility] (Main Entrance A) at [facility]: [address] [place], [place] [address] at 5:30 AM (This time is 2 hour(s) before your procedure to ensure your preparation).   Free valet parking service is available. You will check in at ADMITTING. The support person will be asked to wait in the waiting room.  It is OK to have someone drop you off and come back when you are ready to be discharged.    Special note: Every effort is made to have your procedure done on time. Please understand that emergencies sometimes delay scheduled procedures.  2. Diet: Do not eat solid foods after midnight.  The patient may have clear liquids until 5am upon the day of the procedure.  3. Labs: You will need to have blood drawn on TODAY   4. Medication instructions in preparation for your procedure:   Contrast Allergy: No  DO NOT TAKE TORSEMIDE  THE MORNING OF PROCEDURE   DO NOT TAKE SPIRONOLACTONE  THE MORNING OF PROCEDURE   DO NOT TAKE JARDIANCE  THE MORNING OF PROCEDURE   5. Plan to go home the same day, you will only stay overnight if medically necessary. 6. Bring a current list of your medications and current insurance cards. 7. You MUST have a responsible person to drive you home. 8. Someone MUST be with you the first  24 hours after you arrive home or your discharge will be delayed. 9. Please wear clothes that are easy to get on and off and wear slip-on shoes.  Thank you for allowing us  to care for you!   -- [place] [facility]  Follow-Up in: 2 WEEKS AFTER CATH AS SCHEDULED   At the [facility], you and your health needs are our priority. We have a designated team specialized in the treatment of Heart Failure. This Care Team includes your primary Heart Failure Specialized Cardiologist (physician), Advanced Practice Providers (APPs- Physician Assistants and Nurse Practitioners), and Pharmacist who all work together to provide you with the care you need, when you need it.   You may see any of the following providers on your designated Care Team at your next follow up:  Dr. Toribio Fuel Dr. Ezra Shuck Dr. Ria Commander Dr. Odis Brownie Greig Mosses, NP Caffie Shed, [place] [facility] [place], [place] Beckey Coe, NP Jordan Lee, NP Tinnie Redman, PharmD   Please be sure to bring in all your medications bottles to every appointment.   Need to Contact Us :  If you have any questions or concerns before your next appointment please send us  a message through [place] or call our office at [phone].    TO LEAVE A MESSAGE FOR THE NURSE SELECT OPTION 2, PLEASE LEAVE A MESSAGE INCLUDING: YOUR NAME DATE OF BIRTH CALL BACK NUMBER REASON FOR CALL**this is important as  we prioritize the call backs  YOU WILL RECEIVE A CALL BACK THE SAME DAY AS LONG AS YOU CALL BEFORE 4:00 PM

## 2023-09-22 NOTE — Progress Notes (Signed)
 ADVANCED HF CLINIC NOTE  Primary Care: Cameron Jenkins, No Pcp Per HF Cardiologist: Dr. Gala Romney  HPI:  48 y.o. male with chronic systolic CHF with EF 10% due to presumed NICM, LBBB, HTN, OSA, hx COPD, anxiety, prior tobacco use, hx noncompliance, hx polysubstance abuse.  Diagnosed with HF years ago while admitted to a hospital in [place] with CHF. No records from admission available. There was mention of possible LVAD placement and was discharged with LifeVest. He lost insurance and was not seen for follow-up.   He established care with Dr. Bing Matter in 11/21. Last seen in June 2022. Admitted to psych unit in September 2022 for detox from heroin. He had episode of syncope/? Possible asystole. Had very brief CPR. He was transferred to ICU at [facility]. He was seen by Cardiology. Echo with EF 15-20%. EP considered placement of CRT-D. Defibrillator placement deferred until social situation more stable (he was homeless at the time). Did not follow-up with Cardiology after discharge.   Seen in ED at [facility] on 10/03/21 with chest tightness and dyspnea. Left before evaluated. Represented with a/c CHF. Had been out of medications 2.5 months. UDS + for amphetamines. Echo  EF < 20%, RV moderate to severely reduced, mild MR, dilated IVC. He refused cath or other HF therapies.   Follow up 9/23, NYHA II, and volume stable. Coreg started. Sleep study arranged.  Cath 4/24: Normal cors. EF < 20% RA 8 PA 57/32 (42) PCW 17 Fick 5.5/2.2 PVR 4.5  Echo 1/24 EF 20-25%  RV normal   CPX 5/24 FVC 5.01 (85%)      FEV1 4.02 (87%)        FEV1/FVC 80 (101%)        BP rest: 126/70 Standing BP: 120/74 BP peak: 168/74  Peak VO2: 19.4 (70% predicted peak VO2) - when corrected for ibw 28.3 ml/kg/min VE/VCO2 slope:  37  Peak RER: 1.09   Seen 06/03/23 and was markedly volume overloaded (weight up 30 pounds) and was having BRBPR. We recommended he go to the ER but he was frustrated with and decided not to go. Volume overloaded at  follow-up 12/17. ReDS 36%. Lasix increased to 40 BID and took 2.5 mg metolazone. Did not meet criteria for REFORM trial.  Seen back on 12/27 for f/u. Lower extremity edema improved after he took a dose of metolazone but did not notice much change in UOP with higher dose of lasix. Still complaining of dyspnea with exertion and fatigue. Taking medications as prescribed. Lasix was discontinued and he was placed on torsemide.   He returns today for reassessment.  Feels breathing has improved slightly but continues w/ chronic fatigue both a rest and worsened by exertion.   Wt is down from 301 lb previous visit to 292 lb today. ReDs reading also down from 39>>36% today.   BP controlled, 110/82.   Says he has "no energy". EKG today shows SR w/ 1st deg AVB and nonspecific intraventricular lock, QRS 150 ms.    Cardiac Studies - Echo (1/23): EF < 20%, RV moderate to severely reduced, mild MR, dilated IVC.  - Echo (11/21) [facility] EF 15-20%  - Echo 1/24 EF 20-25%, RV normal  - [facility] 1/24  Findings:   Ao = 88/76  (81) LV = 102/19 RA = 8 RV = 53/13 PA = 57/32 (42) PCW = 17 Fick cardiac output/index = 5.5/2.2 PVR = 4.5 Ao sat = 94% PA sat = 67%, 67%   Assessment:   1. Normal coronary arteries  2. Severe NICM EF < 20% 3. Moderate mixed pulmonary HTN with moderately reduced cardiac output  Past Medical History:  Diagnosis Date   Acute on chronic systolic congestive heart failure (HCC) 05/28/2020   Formatting of this note might be different from the original. IMO update 2021   Chest pain 05/28/2020   Congestive heart failure (CHF) (HCC) 07/21/2020   COPD (chronic obstructive pulmonary disease) (HCC)    Dilated cardiomyopathy (HCC) 07/21/2020   Dyspnea on exertion 07/21/2020   Essential hypertension 05/28/2020   Generalized anxiety disorder 05/28/2020   Heart failure (HCC)    LBBB (left bundle branch block) 05/28/2020   Nausea and vomiting 05/28/2020   Obstructive sleep apnea 07/21/2020    Pneumonia due to COVID-19 virus 05/28/2020   Formatting of this note might be different from the original. Diagnosed by SARS-CoV-2 PCR Cephid test at [facility] in [place], [place] on 05/28/2020.   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    Current Outpatient Medications  Medication Sig Dispense Refill   albuterol (PROVENTIL) (5 MG/ML) 0.5% nebulizer solution Take 2.5 mg by nebulization every 6 (six) hours as needed for wheezing or shortness of breath.     budesonide-formoterol (SYMBICORT) 160-4.5 MCG/ACT inhaler Inhale 2 puffs into the lungs 2 (two) times daily. As needed 1 each 0   digoxin (LANOXIN) 0.125 MG tablet Take 1 tablet (0.125 mg total) by mouth daily. 90 tablet 3   empagliflozin (JARDIANCE) 10 MG TABS tablet Take 1 tablet (10 mg total) by mouth daily before breakfast. 90 tablet 3   metolazone (ZAROXOLYN) 2.5 MG tablet Take 1 tablet (2.5 mg total) by mouth as directed. 90 tablet 3   potassium chloride SA (KLOR-CON M) 20 MEQ tablet Take 3 tablets (60 mEq total) by mouth 2 (two) times daily. Take an extra 2 tabs when you take metolazone     sacubitril-valsartan (ENTRESTO) 24-26 MG Take 1 tablet by mouth 2 (two) times daily. 180 tablet 3   spironolactone (ALDACTONE) 25 MG tablet Take 25 mg by mouth in the morning.     torsemide (DEMADEX) 20 MG tablet Take 2 tablets (40 mg total) by mouth 2 (two) times daily. 120 tablet 3   carvedilol (COREG) 6.25 MG tablet Take 1 tablet (6.25 mg total) by mouth 2 (two) times daily. 60 tablet 11   No current facility-administered medications for this encounter.   Allergies  Allergen Reactions   Acetaminophen Nausea Only   Social History   Socioeconomic History   Marital status: Divorced    Spouse name: Not on file   Number of children: Not on file   Years of education: Not on file   Highest education level: Not on file  Occupational History   Not on file  Tobacco Use   Smoking status: Former    Current packs/day:  0.00    Types: Cigarettes    Quit date: 07/21/2013    Years since quitting: 10.1   Smokeless tobacco: Never  Vaping Use   Vaping status: Never Used  Substance and Sexual Activity   Alcohol use: Never   Drug use: Not Currently    Types: Marijuana   Sexual activity: Not Currently  Other Topics Concern   Not on file  Social History Narrative   Not on file   Social Drivers of Health   Financial Resource Strain: High Risk (10/05/2021)   Overall Financial Resource Strain (CARDIA)    Difficulty of Paying Living Expenses: Hard  Food  Insecurity: Food Insecurity Present (10/05/2021)   Hunger Vital Sign    Worried About Running Out of Food in the Last Year: Sometimes true    Ran Out of Food in the Last Year: Sometimes true  Transportation Needs: Unmet Transportation Needs (10/24/2021)   PRAPARE - Administrator, Civil Service (Medical): Yes    Lack of Transportation (Non-Medical): No  Physical Activity: Not on file  Stress: Not on file  Social Connections: Not on file  Intimate Partner Violence: Not on file   Family History  Problem Relation Age of Onset   COPD Mother    Heart disease Mother    Heart attack Father    Stroke Father    Bone cancer Maternal Grandmother    Stomach cancer Maternal Grandfather    BP 110/82   Pulse 86   Ht 6\' 2"  (1.88 m)   Wt 132.7 kg (292 lb 9.6 oz)   SpO2 93%   BMI 37.57 kg/m   Wt Readings from Last 3 Encounters:  09/22/23 132.7 kg (292 lb 9.6 oz)  09/12/23 (!) 136.5 kg (301 lb)  09/02/23 (!) 137.1 kg (302 lb 3.2 oz)   PHYSICAL EXAM: ReDs 36%  General:  fatigued appearing. No respiratory difficulty HEENT: normal Neck: supple. JVD 7-8 cm. Carotids 2+ bilat; no bruits. No lymphadenopathy or thyromegaly appreciated. Cor: PMI nondisplaced. Regular rate & rhythm. No rubs, gallops or murmurs. Lungs: decreased BS at the bases bilaterally  Abdomen: obese, soft, nontender, nondistended. No hepatosplenomegaly. No bruits or masses. Good  bowel sounds. Extremities: no cyanosis, clubbing, rash, edema Neuro: alert & oriented x 3, cranial nerves grossly intact. moves all 4 extremities w/o difficulty. Affect pleasant.   ReDS: 36%   ASSESSMENT & PLAN: 1. Chronic Systolic CHF: - Diagnosed February 2021 while admitted for HF in [place]. EF 10% at that time -? Related to substance abuse. - Echo (9/22) at [facility] EF 15-20% - Echo (10/05/21): EF < 20%, RV moderate to severely reduced, mild MR, dilated IVC - Echo 1/24 EF 20-25%  - Cath 4/24: Normal cors. EF < 20% RA 8 PA 57/32 (42) PCW 17 Fick 5.5/2.2 PVR 4.5 - Expect CM at least in part d/t LBBB, QRS 150 ms on EKG today  - CPX 5/24 pVO2: 19.4 (70% predicted peak VO2) - corrected for ibw 28.3 ml/kg/min. VE/VCO2  37  pRER: 1.09  - Volume status improved w/ recent diuretic change. Wt down 9 lb. ReDs improving from 39>>36%  but c/w NYHA Class IIIB symptoms, limited more so by exertional fatigue - Recommend repeat RHC to reassess cardiac output/need for advanced therapies. Discussed indication w/ Cameron Jenkins and he is agreeable to proceed   - Continue Torsemide 40 BID. Can take metolazone 2.5 mg PRN.  - Check CMP/BNP today  - Continue digoxin 0.125 mg daily. Check Dig level  - Continue carvedilol 6.25 mg bid. - Continue spironolactone 25 mg daily. - Continue  Entresto 24/26 mg bid. - Continue Jardiance 10 mg daily. - Has been referred to EP for CRT-D  2. LBBB - Previously concerned LBBB was contributing to cardiomyopathy but QRS was only so we decided against it. Now QRS 150 ms on EKG today and I think we need to consider CRT-D - he has been referred to EP   3. HTN - controlled on current regimen - BMP today    4. Polysubstance abuse - Hx heroin and meth abuse.  - UDS + for amphetamines during 1/23 admission - no longer  using   5. Polycythemia - suspect OSA - Sleep study ordered  6. Snoring - Sleep study pending   7. Fatigue - likely multifactorial - RHC per above to  r/o low output HF - check Iron studies - repeat TFTs (recent TSH elevated) - suspect OSA also likely contributing, planning sleep study per above    Follow-up: w/ APP 2 wks post RHC    Mavis Gravelle, PA-C  1:49 PM

## 2023-09-22 NOTE — H&P (View-Only) (Signed)
ADVANCED HF CLINIC NOTE  Primary Care: Patient, No Pcp Per HF Cardiologist: Dr. Gala Romney  HPI:  48 y.o. male with chronic systolic CHF with EF 10% due to presumed NICM, LBBB, HTN, OSA, hx COPD, anxiety, prior tobacco use, hx noncompliance, hx polysubstance abuse.  Diagnosed with HF years ago while admitted to a hospital in [place] with CHF. No records from admission available. There was mention of possible LVAD placement and was discharged with LifeVest. He lost insurance and was not seen for follow-up.   He established care with Dr. Bing Matter in 11/21. Last seen in June 2022. Admitted to psych unit in September 2022 for detox from heroin. He had episode of syncope/? Possible asystole. Had very brief CPR. He was transferred to ICU at [facility]. He was seen by Cardiology. Echo with EF 15-20%. EP considered placement of CRT-D. Defibrillator placement deferred until social situation more stable (he was homeless at the time). Did not follow-up with Cardiology after discharge.   Seen in ED at [facility] on 10/03/21 with chest tightness and dyspnea. Left before evaluated. Represented with a/c CHF. Had been out of medications 2.5 months. UDS + for amphetamines. Echo  EF < 20%, RV moderate to severely reduced, mild MR, dilated IVC. He refused cath or other HF therapies.   Follow up 9/23, NYHA II, and volume stable. Coreg started. Sleep study arranged.  Cath 4/24: Normal cors. EF < 20% RA 8 PA 57/32 (42) PCW 17 Fick 5.5/2.2 PVR 4.5  Echo 1/24 EF 20-25%  RV normal   CPX 5/24 FVC 5.01 (85%)      FEV1 4.02 (87%)        FEV1/FVC 80 (101%)        BP rest: 126/70 Standing BP: 120/74 BP peak: 168/74  Peak VO2: 19.4 (70% predicted peak VO2) - when corrected for ibw 28.3 ml/kg/min VE/VCO2 slope:  37  Peak RER: 1.09   Seen 06/03/23 and was markedly volume overloaded (weight up 30 pounds) and was having BRBPR. We recommended he go to the ER but he was frustrated with and decided not to go. Volume overloaded at  follow-up 12/17. ReDS 36%. Lasix increased to 40 BID and took 2.5 mg metolazone. Did not meet criteria for REFORM trial.  Seen back on 12/27 for f/u. Lower extremity edema improved after he took a dose of metolazone but did not notice much change in UOP with higher dose of lasix. Still complaining of dyspnea with exertion and fatigue. Taking medications as prescribed. Lasix was discontinued and he was placed on torsemide.   He returns today for reassessment.  Feels breathing has improved slightly but continues w/ chronic fatigue both a rest and worsened by exertion.   Wt is down from 301 lb previous visit to 292 lb today. ReDs reading also down from 39>>36% today.   BP controlled, 110/82.   Says he has "no energy". EKG today shows SR w/ 1st deg AVB and nonspecific intraventricular lock, QRS 150 ms.    Cardiac Studies - Echo (1/23): EF < 20%, RV moderate to severely reduced, mild MR, dilated IVC.  - Echo (11/21) [facility] EF 15-20%  - Echo 1/24 EF 20-25%, RV normal  - [facility] 1/24  Findings:   Ao = 88/76  (81) LV = 102/19 RA = 8 RV = 53/13 PA = 57/32 (42) PCW = 17 Fick cardiac output/index = 5.5/2.2 PVR = 4.5 Ao sat = 94% PA sat = 67%, 67%   Assessment:   1. Normal coronary arteries  2. Severe NICM EF < 20% 3. Moderate mixed pulmonary HTN with moderately reduced cardiac output  Past Medical History:  Diagnosis Date   Acute on chronic systolic congestive heart failure (HCC) 05/28/2020   Formatting of this note might be different from the original. IMO update 2021   Chest pain 05/28/2020   Congestive heart failure (CHF) (HCC) 07/21/2020   COPD (chronic obstructive pulmonary disease) (HCC)    Dilated cardiomyopathy (HCC) 07/21/2020   Dyspnea on exertion 07/21/2020   Essential hypertension 05/28/2020   Generalized anxiety disorder 05/28/2020   Heart failure (HCC)    LBBB (left bundle branch block) 05/28/2020   Nausea and vomiting 05/28/2020   Obstructive sleep apnea 07/21/2020    Pneumonia due to COVID-19 virus 05/28/2020   Formatting of this note might be different from the original. Diagnosed by SARS-CoV-2 PCR Cephid test at [facility] in [place], [place] on 05/28/2020.   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    Current Outpatient Medications  Medication Sig Dispense Refill   albuterol (PROVENTIL) (5 MG/ML) 0.5% nebulizer solution Take 2.5 mg by nebulization every 6 (six) hours as needed for wheezing or shortness of breath.     budesonide-formoterol (SYMBICORT) 160-4.5 MCG/ACT inhaler Inhale 2 puffs into the lungs 2 (two) times daily. As needed 1 each 0   digoxin (LANOXIN) 0.125 MG tablet Take 1 tablet (0.125 mg total) by mouth daily. 90 tablet 3   empagliflozin (JARDIANCE) 10 MG TABS tablet Take 1 tablet (10 mg total) by mouth daily before breakfast. 90 tablet 3   metolazone (ZAROXOLYN) 2.5 MG tablet Take 1 tablet (2.5 mg total) by mouth as directed. 90 tablet 3   potassium chloride SA (KLOR-CON M) 20 MEQ tablet Take 3 tablets (60 mEq total) by mouth 2 (two) times daily. Take an extra 2 tabs when you take metolazone     sacubitril-valsartan (ENTRESTO) 24-26 MG Take 1 tablet by mouth 2 (two) times daily. 180 tablet 3   spironolactone (ALDACTONE) 25 MG tablet Take 25 mg by mouth in the morning.     torsemide (DEMADEX) 20 MG tablet Take 2 tablets (40 mg total) by mouth 2 (two) times daily. 120 tablet 3   carvedilol (COREG) 6.25 MG tablet Take 1 tablet (6.25 mg total) by mouth 2 (two) times daily. 60 tablet 11   No current facility-administered medications for this encounter.   Allergies  Allergen Reactions   Acetaminophen Nausea Only   Social History   Socioeconomic History   Marital status: Divorced    Spouse name: Not on file   Number of children: Not on file   Years of education: Not on file   Highest education level: Not on file  Occupational History   Not on file  Tobacco Use   Smoking status: Former    Current packs/day:  0.00    Types: Cigarettes    Quit date: 07/21/2013    Years since quitting: 10.1   Smokeless tobacco: Never  Vaping Use   Vaping status: Never Used  Substance and Sexual Activity   Alcohol use: Never   Drug use: Not Currently    Types: Marijuana   Sexual activity: Not Currently  Other Topics Concern   Not on file  Social History Narrative   Not on file   Social Drivers of Health   Financial Resource Strain: High Risk (10/05/2021)   Overall Financial Resource Strain (CARDIA)    Difficulty of Paying Living Expenses: Hard  Food  Insecurity: Food Insecurity Present (10/05/2021)   Hunger Vital Sign    Worried About Running Out of Food in the Last Year: Sometimes true    Ran Out of Food in the Last Year: Sometimes true  Transportation Needs: Unmet Transportation Needs (10/24/2021)   PRAPARE - Administrator, Civil Service (Medical): Yes    Lack of Transportation (Non-Medical): No  Physical Activity: Not on file  Stress: Not on file  Social Connections: Not on file  Intimate Partner Violence: Not on file   Family History  Problem Relation Age of Onset   COPD Mother    Heart disease Mother    Heart attack Father    Stroke Father    Bone cancer Maternal Grandmother    Stomach cancer Maternal Grandfather    BP 110/82   Pulse 86   Ht 6\' 2"  (1.88 m)   Wt 132.7 kg (292 lb 9.6 oz)   SpO2 93%   BMI 37.57 kg/m   Wt Readings from Last 3 Encounters:  09/22/23 132.7 kg (292 lb 9.6 oz)  09/12/23 (!) 136.5 kg (301 lb)  09/02/23 (!) 137.1 kg (302 lb 3.2 oz)   PHYSICAL EXAM: ReDs 36%  General:  fatigued appearing. No respiratory difficulty HEENT: normal Neck: supple. JVD 7-8 cm. Carotids 2+ bilat; no bruits. No lymphadenopathy or thyromegaly appreciated. Cor: PMI nondisplaced. Regular rate & rhythm. No rubs, gallops or murmurs. Lungs: decreased BS at the bases bilaterally  Abdomen: obese, soft, nontender, nondistended. No hepatosplenomegaly. No bruits or masses. Good  bowel sounds. Extremities: no cyanosis, clubbing, rash, edema Neuro: alert & oriented x 3, cranial nerves grossly intact. moves all 4 extremities w/o difficulty. Affect pleasant.   ReDS: 36%   ASSESSMENT & PLAN: 1. Chronic Systolic CHF: - Diagnosed February 2021 while admitted for HF in [place]. EF 10% at that time -? Related to substance abuse. - Echo (9/22) at [facility] EF 15-20% - Echo (10/05/21): EF < 20%, RV moderate to severely reduced, mild MR, dilated IVC - Echo 1/24 EF 20-25%  - Cath 4/24: Normal cors. EF < 20% RA 8 PA 57/32 (42) PCW 17 Fick 5.5/2.2 PVR 4.5 - Expect CM at least in part d/t LBBB, QRS 150 ms on EKG today  - CPX 5/24 pVO2: 19.4 (70% predicted peak VO2) - corrected for ibw 28.3 ml/kg/min. VE/VCO2  37  pRER: 1.09  - Volume status improved w/ recent diuretic change. Wt down 9 lb. ReDs improving from 39>>36%  but c/w NYHA Class IIIB symptoms, limited more so by exertional fatigue - Recommend repeat RHC to reassess cardiac output/need for advanced therapies. Discussed indication w/ patient and he is agreeable to proceed   - Continue Torsemide 40 BID. Can take metolazone 2.5 mg PRN.  - Check CMP/BNP today  - Continue digoxin 0.125 mg daily. Check Dig level  - Continue carvedilol 6.25 mg bid. - Continue spironolactone 25 mg daily. - Continue  Entresto 24/26 mg bid. - Continue Jardiance 10 mg daily. - Has been referred to EP for CRT-D  2. LBBB - Previously concerned LBBB was contributing to cardiomyopathy but QRS was only so we decided against it. Now QRS 150 ms on EKG today and I think we need to consider CRT-D - he has been referred to EP   3. HTN - controlled on current regimen - BMP today    4. Polysubstance abuse - Hx heroin and meth abuse.  - UDS + for amphetamines during 1/23 admission - no longer  using   5. Polycythemia - suspect OSA - Sleep study ordered  6. Snoring - Sleep study pending   7. Fatigue - likely multifactorial - RHC per above to  r/o low output HF - check Iron studies - repeat TFTs (recent TSH elevated) - suspect OSA also likely contributing, planning sleep study per above    Follow-up: w/ APP 2 wks post RHC    Mavis Gravelle, PA-C  1:49 PM

## 2023-09-23 ENCOUNTER — Other Ambulatory Visit (HOSPITAL_COMMUNITY): Payer: Self-pay | Admitting: *Deleted

## 2023-09-23 ENCOUNTER — Telehealth (HOSPITAL_COMMUNITY): Payer: Self-pay

## 2023-09-23 ENCOUNTER — Telehealth (HOSPITAL_COMMUNITY): Payer: Self-pay | Admitting: *Deleted

## 2023-09-23 DIAGNOSIS — D508 Other iron deficiency anemias: Secondary | ICD-10-CM

## 2023-09-23 DIAGNOSIS — I5022 Chronic systolic (congestive) heart failure: Secondary | ICD-10-CM

## 2023-09-23 LAB — T3, FREE: T3, Free: 4 pg/mL (ref 2.0–4.4)

## 2023-09-23 NOTE — Telephone Encounter (Signed)
No precert required 

## 2023-09-23 NOTE — Telephone Encounter (Signed)
 Called patient per Laymon Shed, PA with following lab results and instructions:  Renal fx and K ok but Iron levels are low. Iron deficiency anemia likely contributing to fatigue. Refer for IV iron  Pt verbalized understanding of same. IV iron ordered; forwarded to Philicia Branch for prior authorization and scheduling. Asked patient to call us  at [phone] if any questions or concerns.

## 2023-09-23 NOTE — Telephone Encounter (Signed)
-----   Message from Nurse Eileen Stanford B sent at 09/22/2023  3:52 PM EST ----- Regarding: RHC Hi- patient scheduled for RHC on 1/30 with Dr. Gala Romney for HF   Can you precert??    Thank you  Jenna B.

## 2023-09-30 ENCOUNTER — Other Ambulatory Visit (HOSPITAL_COMMUNITY): Payer: Self-pay | Admitting: Internal Medicine

## 2023-09-30 NOTE — Telephone Encounter (Signed)
 Scheduled, left patient a detailed message stating that he checks in at the main entrance of the hospital, entrance a and his appointment is Thursday 1/16 @ 11am.

## 2023-10-02 ENCOUNTER — Ambulatory Visit (HOSPITAL_COMMUNITY)
Admission: RE | Admit: 2023-10-02 | Discharge: 2023-10-02 | Disposition: A | Discharge status: Home / Self Care | Payer: MEDICAID | Source: Ambulatory Visit | Attending: Internal Medicine | Admitting: Internal Medicine

## 2023-10-02 DIAGNOSIS — I5022 Chronic systolic (congestive) heart failure: Secondary | ICD-10-CM | POA: Insufficient documentation

## 2023-10-02 DIAGNOSIS — D508 Other iron deficiency anemias: Secondary | ICD-10-CM | POA: Diagnosis present

## 2023-10-02 MED ORDER — FERUMOXYTOL INJECTION 510 MG/17 ML
510.0000 mg | Freq: Once | INTRAVENOUS | Status: DC
Start: 2023-10-02 — End: 2023-10-02

## 2023-10-02 MED ORDER — SODIUM CHLORIDE 0.9 % IV SOLN
510.0000 mg | Freq: Once | INTRAVENOUS | Status: AC
  Administered 2023-10-02: 510 mg via INTRAVENOUS
  Filled 2023-10-02: qty 510

## 2023-10-14 ENCOUNTER — Ambulatory Visit: Payer: MEDICAID | Attending: Cardiology | Admitting: Cardiology

## 2023-10-14 ENCOUNTER — Encounter: Payer: Self-pay | Admitting: Cardiology

## 2023-10-14 VITALS — BP 110/68 | HR 103 | Ht 73.0 in | Wt 289.0 lb

## 2023-10-14 DIAGNOSIS — I428 Other cardiomyopathies: Secondary | ICD-10-CM | POA: Diagnosis not present

## 2023-10-14 DIAGNOSIS — I5022 Chronic systolic (congestive) heart failure: Secondary | ICD-10-CM | POA: Diagnosis not present

## 2023-10-14 DIAGNOSIS — I447 Left bundle-branch block, unspecified: Secondary | ICD-10-CM

## 2023-10-14 NOTE — Patient Instructions (Signed)
Medication Instructions:  Your physician recommends that you continue on your current medications as directed. Please refer to the Current Medication list given to you today.  *If you need a refill on your cardiac medications before your next appointment, please call your pharmacy*  Lab Work: BMET and CBC  If you have labs (blood work) drawn today and your tests are completely normal, you will receive your results only by: MyChart Message (if you have MyChart) OR A paper copy in the mail If you have any lab test that is abnormal or we need to change your treatment, we will call you to review the results.  Testing/Procedures: ICD Implant Your physician has recommended that you have a defibrillator inserted. An implantable cardioverter defibrillator (ICD) is a small device that is placed in your chest or, in rare cases, your abdomen. This device uses electrical pulses or shocks to help control life-threatening, irregular heartbeats that could lead the heart to suddenly stop beating (sudden cardiac arrest). Leads are attached to the ICD that goes into your heart. This is done in the hospital and usually requires an overnight stay. Please see the instruction sheet given to you today for more information.  Follow-Up: At [facility], you and your health needs are our priority.  As part of our continuing mission to provide you with exceptional heart care, we have created designated Provider Care Teams.  These Care Teams include your primary Cardiologist (physician) and Advanced Practice Providers (APPs -  Physician Assistants and Nurse Practitioners) who all work together to provide you with the care you need, when you need it.  Your next appointment:   We will call you to arrange your follow up appointments.

## 2023-10-14 NOTE — Progress Notes (Unsigned)
Electrophysiology Office Note:   Date:  10/15/2023  ID:  Cameron Jenkins, DOB [birth year], MRN [id number]  Primary Cardiologist: None Electrophysiologist: Nobie Putnam, MD      History of Present Illness:   Cameron Jenkins is a 48 y.o. male with h/o chronic systolic heart failure secondary due to presumed NICM, LBBB, HTN, OSA, hx COPD, anxiety, prior tobacco use and polysubstance abuse who is being seen today for evaluation for CRT-D implant.  He has a long-standing history of heart failure, previously followed in [place]. CRT-D was considered in 2022 but at the time patient was homeless and device placement was deferred. Relocated to . Transitioned care to Dr. Gala Romney, He was seen in clinic in September 2024 and found to be volume overloaded. Declined admission to hospital . At follow up visit in December 2024 his volume status was improved but still short of breath and fatigued.   Discussed the use of AI scribe software for clinical note transcription with the patient, who gave verbal consent to proceed.  History of Present Illness   A 48 year old patient with a history of heart failure and left bundle branch block was referred for evaluation of a Cardiac Resynchronization Therapy Defibrillator (CRTD). The patient's heart failure has been persistent despite medication, leading to significant weight gain, lower extremity edema and shortness of breath even with minimal exertion. Recently, the patient's condition has improved with a switch to torsemide, resulting in weight loss and less shortness of breath. The patient has a history of drug use, which he believes may have contributed to his heart condition. CRT-D had been offered to him previously but he was homeless at the time. He is currently in a much better living situation and has been clean for several years now so is ready to readdress CRT-D implant. He has no new or acute complaints today.     Review of systems complete and found to be  negative unless listed in HPI.   EP Information / Studies Reviewed:    EKG is not ordered today. EKG from 09/22/23 reviewed which showed sinus rhythm with IVCD, LBBB like pattern, QRS .      Cardiac MRI 01/22/13: IMPRESSION: 1. Severe LV dilatation, normal wall thickness, and severe systolic dysfunction (EF 15%)   2.  Moderate RV dilatation with severe systolic dysfunction (EF 18%)   3. RV insertion site LGE, which is a nonspecific scar pattern often seen in setting of elevated pulmonary pressures   4.  Mild mitral regurgitation (regurgitant fraction 15%)  RHC/LHC 12/27/22: Ao = 88/76  (81) LV = 102/19 RA = 8 RV = 53/13 PA = 57/32 (42) PCW = 17 Fick cardiac output/index = 5.5/2.2 PVR = 4.5 Ao sat = 94% PA sat = 67%, 67%   Assessment:   1. Normal coronary arteries 2. Severe NICM EF < 20% 3. 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 Left ventricular ejection fraction, by estimation, is 20 to 25%. The  left ventricle has severely decreased function. The left ventricle  demonstrates global hypokinesis. There is mild concentric left ventricular  hypertrophy. Left ventricular diastolic   parameters are indeterminate.   2. Right ventricular systolic function is normal. The right ventricular  size is normal.   3. The mitral valve is normal in structure. Mild mitral valve  regurgitation. No evidence of mitral stenosis.   4. The aortic valve is normal in structure. Aortic valve regurgitation is  not visualized. No aortic stenosis is present.  5. The inferior vena cava is normal in size with greater than 50%  respiratory variability, suggesting right atrial pressure of 3 mmHg.   Physical Exam:   VS:  BP 110/68 (BP Location: Left Arm, Patient Position: Sitting, Cuff Size: Large)   Pulse (!) 103   Ht 6\' 1"  (1.854 m)   Wt 289 lb (131.1 kg)   SpO2 95%   BMI 38.13 kg/m    Wt Readings from Last 3 Encounters:  10/14/23 289 lb  (131.1 kg)  10/02/23 281 lb (127.5 kg)  09/22/23 292 lb 9.6 oz (132.7 kg)     GEN: Well nourished, well developed in no acute distress NECK: No JVD CARDIAC: Normal rate, regular rhythm RESPIRATORY:  Clear to auscultation without rales, wheezing or rhonchi  ABDOMEN: Soft,  non-distended EXTREMITIES:  Trace edema; No deformity   ASSESSMENT AND PLAN:   Cameron Jenkins is a 48 y.o. male with h/o chronic systolic heart failure secondary due to presumed NICM, LBBB, HTN, OSA, hx COPD, anxiety, prior tobacco use and polysubstance abuse who is being seen today for evaluation for CRT-D implant.  #. Chronic systolic heart failure: NYHA class III. Persistent LV dysfunction despite medications. #. NICM #. LBBB/IVCD: IVCD with LBBB like pattern, QRS up to .  - He has an upcoming RHC scheduled with Dr. Gala Romney.  - Continue GDMT and excellent care by our HF colleagues.  - Patient meets criteria for primary prevention defibrillator based on EF <35% despite optimal medical therapy and CRT for LBBB like pattern and QRS duration of in setting of NYHA class III HF. I have had a thorough discussion with the patient reviewing options.  The patient and have had opportunities to ask questions and have them answered. Risks, benefits, alternatives to ICD implantation were discussed in detail with the patient today. The patient  understands that the risks include but are not limited to bleeding, infection, pneumothorax, perforation, tamponade, vascular damage, renal failure, MI, stroke, death, inappropriate shocks, and lead dislodgement and wishes to proceed.  We will therefore schedule device implantation at the next available time. We will plan for CRT-D after RHC and will discuss with Dr. Gala Romney whether any other workup prior to CRT-D implant will be necessary based on results of RHC.    Signed, Nobie Putnam, MD

## 2023-10-16 ENCOUNTER — Ambulatory Visit (HOSPITAL_COMMUNITY)
Admission: RE | Admit: 2023-10-16 | Discharge: 2023-10-16 | Disposition: A | Discharge status: Home / Self Care | Payer: MEDICAID | Source: Ambulatory Visit | Attending: Internal Medicine | Admitting: Internal Medicine

## 2023-10-16 ENCOUNTER — Other Ambulatory Visit: Payer: Self-pay

## 2023-10-16 ENCOUNTER — Encounter (HOSPITAL_COMMUNITY)
Admission: RE | Disposition: A | Discharge status: Home / Self Care | Payer: Self-pay | Source: Ambulatory Visit | Attending: Internal Medicine

## 2023-10-16 DIAGNOSIS — I447 Left bundle-branch block, unspecified: Secondary | ICD-10-CM | POA: Insufficient documentation

## 2023-10-16 DIAGNOSIS — J449 Chronic obstructive pulmonary disease, unspecified: Secondary | ICD-10-CM | POA: Diagnosis not present

## 2023-10-16 DIAGNOSIS — I110 Hypertensive heart disease with heart failure: Secondary | ICD-10-CM | POA: Insufficient documentation

## 2023-10-16 DIAGNOSIS — I5023 Acute on chronic systolic (congestive) heart failure: Secondary | ICD-10-CM

## 2023-10-16 DIAGNOSIS — Z87891 Personal history of nicotine dependence: Secondary | ICD-10-CM | POA: Insufficient documentation

## 2023-10-16 DIAGNOSIS — D751 Secondary polycythemia: Secondary | ICD-10-CM | POA: Insufficient documentation

## 2023-10-16 DIAGNOSIS — R0683 Snoring: Secondary | ICD-10-CM | POA: Insufficient documentation

## 2023-10-16 DIAGNOSIS — I509 Heart failure, unspecified: Secondary | ICD-10-CM | POA: Diagnosis not present

## 2023-10-16 DIAGNOSIS — I272 Pulmonary hypertension, unspecified: Secondary | ICD-10-CM | POA: Insufficient documentation

## 2023-10-16 DIAGNOSIS — F1910 Other psychoactive substance abuse, uncomplicated: Secondary | ICD-10-CM | POA: Insufficient documentation

## 2023-10-16 DIAGNOSIS — R5383 Other fatigue: Secondary | ICD-10-CM | POA: Insufficient documentation

## 2023-10-16 DIAGNOSIS — I420 Dilated cardiomyopathy: Secondary | ICD-10-CM | POA: Insufficient documentation

## 2023-10-16 DIAGNOSIS — I5022 Chronic systolic (congestive) heart failure: Secondary | ICD-10-CM | POA: Diagnosis not present

## 2023-10-16 DIAGNOSIS — I5082 Biventricular heart failure: Secondary | ICD-10-CM | POA: Diagnosis not present

## 2023-10-16 DIAGNOSIS — F419 Anxiety disorder, unspecified: Secondary | ICD-10-CM | POA: Diagnosis not present

## 2023-10-16 DIAGNOSIS — Z79899 Other long term (current) drug therapy: Secondary | ICD-10-CM | POA: Insufficient documentation

## 2023-10-16 DIAGNOSIS — G4733 Obstructive sleep apnea (adult) (pediatric): Secondary | ICD-10-CM | POA: Diagnosis not present

## 2023-10-16 HISTORY — PX: RIGHT HEART CATH: CATH118263

## 2023-10-16 LAB — POCT I-STAT EG7
Acid-Base Excess: 6 mmol/L — ABNORMAL HIGH (ref 0.0–2.0)
Acid-Base Excess: 6 mmol/L — ABNORMAL HIGH (ref 0.0–2.0)
Bicarbonate: 32.2 mmol/L — ABNORMAL HIGH (ref 20.0–28.0)
Bicarbonate: 33.1 mmol/L — ABNORMAL HIGH (ref 20.0–28.0)
Calcium, Ion: 1.16 mmol/L (ref 1.15–1.40)
Calcium, Ion: 1.21 mmol/L (ref 1.15–1.40)
HCT: 48 % (ref 39.0–52.0)
HCT: 49 % (ref 39.0–52.0)
Hemoglobin: 16.3 g/dL (ref 13.0–17.0)
Hemoglobin: 16.7 g/dL (ref 13.0–17.0)
O2 Saturation: 62 %
O2 Saturation: 63 %
Potassium: 3.2 mmol/L — ABNORMAL LOW (ref 3.5–5.1)
Potassium: 3.3 mmol/L — ABNORMAL LOW (ref 3.5–5.1)
Sodium: 138 mmol/L (ref 135–145)
Sodium: 138 mmol/L (ref 135–145)
TCO2: 34 mmol/L — ABNORMAL HIGH (ref 22–32)
TCO2: 35 mmol/L — ABNORMAL HIGH (ref 22–32)
pCO2, Ven: 52.4 mm[Hg] (ref 44–60)
pCO2, Ven: 52.9 mm[Hg] (ref 44–60)
pH, Ven: 7.396 (ref 7.25–7.43)
pH, Ven: 7.404 (ref 7.25–7.43)
pO2, Ven: 33 mm[Hg] (ref 32–45)
pO2, Ven: 33 mm[Hg] (ref 32–45)

## 2023-10-16 LAB — CBC
HCT: 51.9 % (ref 39.0–52.0)
Hemoglobin: 16.9 g/dL (ref 13.0–17.0)
MCH: 28.4 pg (ref 26.0–34.0)
MCHC: 32.6 g/dL (ref 30.0–36.0)
MCV: 87.2 fL (ref 80.0–100.0)
Platelets: 220 10*3/uL (ref 150–400)
RBC: 5.95 MIL/uL — ABNORMAL HIGH (ref 4.22–5.81)
RDW: 14.7 % (ref 11.5–15.5)
WBC: 7.6 10*3/uL (ref 4.0–10.5)
nRBC: 0 % (ref 0.0–0.2)

## 2023-10-16 SURGERY — RIGHT HEART CATH
Anesthesia: LOCAL

## 2023-10-16 MED ORDER — SODIUM CHLORIDE 0.9 % IV SOLN: 250.0000 mL | INTRAVENOUS | Status: DC | PRN

## 2023-10-16 MED ORDER — ACETAMINOPHEN 325 MG PO TABS
650.0000 mg | ORAL_TABLET | ORAL | Status: DC | PRN
Start: 2023-10-16 — End: 2023-10-16

## 2023-10-16 MED ORDER — ONDANSETRON HCL 4 MG/2ML IJ SOLN: 4.0000 mg | Freq: Four times a day (QID) | INTRAMUSCULAR | Status: DC | PRN

## 2023-10-16 MED ORDER — FENTANYL CITRATE (PF) 100 MCG/2ML IJ SOLN
INTRAMUSCULAR | Status: AC
  Filled 2023-10-16: qty 2

## 2023-10-16 MED ORDER — LABETALOL HCL 5 MG/ML IV SOLN: 10.0000 mg | INTRAVENOUS | Status: DC | PRN

## 2023-10-16 MED ORDER — SODIUM CHLORIDE 0.9 % IV SOLN: INTRAVENOUS | Status: DC

## 2023-10-16 MED ORDER — HEPARIN (PORCINE) IN NACL 1000-0.9 UT/500ML-% IV SOLN
INTRAVENOUS | Status: DC | PRN
  Administered 2023-10-16: 500 mL

## 2023-10-16 MED ORDER — LIDOCAINE HCL (PF) 1 % IJ SOLN
INTRAMUSCULAR | Status: DC | PRN
  Administered 2023-10-16: 2 mL

## 2023-10-16 MED ORDER — HYDRALAZINE HCL 20 MG/ML IJ SOLN: 10.0000 mg | INTRAMUSCULAR | Status: DC | PRN

## 2023-10-16 MED ORDER — SODIUM CHLORIDE 0.9% FLUSH
3.0000 mL | Freq: Two times a day (BID) | INTRAVENOUS | Status: DC
Start: 2023-10-16 — End: 2023-10-16

## 2023-10-16 MED ORDER — LIDOCAINE HCL (PF) 1 % IJ SOLN
INTRAMUSCULAR | Status: AC
  Filled 2023-10-16: qty 30

## 2023-10-16 MED ORDER — MIDAZOLAM HCL 2 MG/2ML IJ SOLN
INTRAMUSCULAR | Status: AC
  Filled 2023-10-16: qty 2

## 2023-10-16 MED ORDER — FENTANYL CITRATE (PF) 100 MCG/2ML IJ SOLN
INTRAMUSCULAR | Status: DC | PRN
  Administered 2023-10-16: 25 ug via INTRAVENOUS

## 2023-10-16 MED ORDER — MIDAZOLAM HCL 2 MG/2ML IJ SOLN
INTRAMUSCULAR | Status: DC | PRN
  Administered 2023-10-16: 2 mg via INTRAVENOUS

## 2023-10-16 MED ORDER — SODIUM CHLORIDE 0.9% FLUSH: 3.0000 mL | INTRAVENOUS | Status: DC | PRN

## 2023-10-16 SURGICAL SUPPLY — 6 items
CATH BALLN WEDGE 5F 110CM (CATHETERS) IMPLANT
GLIDESHEATH SLEND SS 6F .021 (SHEATH) IMPLANT
PACK CARDIAC CATHETERIZATION (CUSTOM PROCEDURE TRAY) ×1 IMPLANT
SHEATH GLIDE SLENDER 4/5FR (SHEATH) IMPLANT
TRANSDUCER W/STOPCOCK (MISCELLANEOUS) IMPLANT
TUBING ART PRESS 72 MALE/FEM (TUBING) IMPLANT

## 2023-10-16 NOTE — Discharge Instructions (Signed)

## 2023-10-16 NOTE — Interval H&P Note (Signed)
History and Physical Interval Note:  10/16/2023 7:46 AM  [name]  has presented today for surgery, with the diagnosis of heart failure.  The various methods of treatment have been discussed with the patient and family. After consideration of risks, benefits and other options for treatment, the patient has consented to  Procedure(s): RIGHT HEART CATH (N/A) as a surgical intervention.  The patient's history has been reviewed, patient examined, no change in status, stable for surgery.  I have reviewed the patient's chart and labs.  Questions were answered to the patient's satisfaction.     Cully Luckow
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
## 2023-10-23 ENCOUNTER — Encounter: Payer: Self-pay | Admitting: Physician Assistant

## 2023-10-23 ENCOUNTER — Ambulatory Visit: Payer: MEDICAID | Admitting: Physician Assistant

## 2023-10-23 VITALS — BP 123/87 | HR 101 | Temp 98.0°F | Ht 73.0 in | Wt 289.4 lb

## 2023-10-23 DIAGNOSIS — Z114 Encounter for screening for human immunodeficiency virus [HIV]: Secondary | ICD-10-CM

## 2023-10-23 DIAGNOSIS — E119 Type 2 diabetes mellitus without complications: Secondary | ICD-10-CM | POA: Diagnosis not present

## 2023-10-23 DIAGNOSIS — I5022 Chronic systolic (congestive) heart failure: Secondary | ICD-10-CM | POA: Diagnosis not present

## 2023-10-23 DIAGNOSIS — G4733 Obstructive sleep apnea (adult) (pediatric): Secondary | ICD-10-CM

## 2023-10-23 DIAGNOSIS — F418 Other specified anxiety disorders: Secondary | ICD-10-CM | POA: Insufficient documentation

## 2023-10-23 DIAGNOSIS — K625 Hemorrhage of anus and rectum: Secondary | ICD-10-CM | POA: Diagnosis not present

## 2023-10-23 DIAGNOSIS — Z1159 Encounter for screening for other viral diseases: Secondary | ICD-10-CM

## 2023-10-23 DIAGNOSIS — J449 Chronic obstructive pulmonary disease, unspecified: Secondary | ICD-10-CM | POA: Diagnosis not present

## 2023-10-23 DIAGNOSIS — R739 Hyperglycemia, unspecified: Secondary | ICD-10-CM

## 2023-10-23 DIAGNOSIS — Z7984 Long term (current) use of oral hypoglycemic drugs: Secondary | ICD-10-CM

## 2023-10-23 DIAGNOSIS — L989 Disorder of the skin and subcutaneous tissue, unspecified: Secondary | ICD-10-CM

## 2023-10-23 DIAGNOSIS — I10 Essential (primary) hypertension: Secondary | ICD-10-CM

## 2023-10-23 MED ORDER — MUPIROCIN 2 % EX OINT: 1.0000 | TOPICAL_OINTMENT | Freq: Two times a day (BID) | CUTANEOUS | 0 refills | Status: DC | PRN

## 2023-10-23 MED ORDER — BUDESONIDE-FORMOTEROL FUMARATE 160-4.5 MCG/ACT IN AERO
2.0000 | INHALATION_SPRAY | Freq: Two times a day (BID) | RESPIRATORY_TRACT | 3 refills | Status: DC
  Filled 2023-12-27 – 2024-01-16 (×2): qty 10.2, 30d supply, fill #0
  Filled 2024-02-13: qty 10.2, 30d supply, fill #1
  Filled 2024-03-22: qty 10.2, 30d supply, fill #2

## 2023-10-23 MED ORDER — LORAZEPAM 1 MG PO TABS: ORAL_TABLET | ORAL | 0 refills | Status: DC

## 2023-10-23 MED ORDER — ALBUTEROL SULFATE (2.5 MG/3ML) 0.083% IN NEBU
2.5000 mg | INHALATION_SOLUTION | Freq: Four times a day (QID) | RESPIRATORY_TRACT | 1 refills | Status: DC | PRN
  Filled 2023-12-27: qty 180, 15d supply, fill #0

## 2023-10-23 NOTE — Assessment & Plan Note (Signed)
 Pt manages with symbicort  and albuterol  nebs.  Referring to pulm

## 2023-10-23 NOTE — Assessment & Plan Note (Signed)
 Pt recalls offhand when I brought up colon cancer screening w/ cologuard.  Given patient's report of profuse rectal bleeding, referring to GI urgently Last cbc review,  no anemia

## 2023-10-23 NOTE — Assessment & Plan Note (Signed)
 Pt reports significant anxiety w/ blood draws. He reports his cardiologist supplies him anxiety meds before blood draws-- I do not see anything in PDMP.  Given we need an A1c, I will rx 1 mg ativan  #1 for him to take before blood draw.

## 2023-10-23 NOTE — Assessment & Plan Note (Signed)
 2/2 NICM, followed by cardiology. Plans for CRT-D device this month Appears euvolemic today

## 2023-10-23 NOTE — Assessment & Plan Note (Signed)
 No recent cpap use. Referring to pulm for possible repeat sleep study and new cpap

## 2023-10-23 NOTE — Assessment & Plan Note (Signed)
 Well controlled  Will continue to monitor.  Reviewed last cmp

## 2023-10-23 NOTE — Assessment & Plan Note (Signed)
 Last A1c 6.3% In 2023. Stressed importance of checking today- pt reports signficant anxiety w/ blood draws and will schedule.   Ordering A1c, uacr. On review of recent random glucoses, fairly within range.  Pt does take jardiance  10 mg

## 2023-10-23 NOTE — Assessment & Plan Note (Signed)
 Poor healing.  Abdomen -- pt reports he often works on his car and is leaning on the hood, maybe where the chronic ecchymosis originate from . Rx mupirocin  for new wounds, will monitor for infection

## 2023-10-27 ENCOUNTER — Other Ambulatory Visit: Payer: MEDICAID

## 2023-10-27 ENCOUNTER — Encounter: Payer: Self-pay | Admitting: Gastroenterology

## 2023-10-29 ENCOUNTER — Other Ambulatory Visit: Payer: MEDICAID

## 2023-10-29 ENCOUNTER — Telehealth (HOSPITAL_COMMUNITY): Payer: Self-pay

## 2023-10-29 ENCOUNTER — Other Ambulatory Visit (HOSPITAL_COMMUNITY): Payer: Self-pay | Admitting: *Deleted

## 2023-10-29 DIAGNOSIS — I5022 Chronic systolic (congestive) heart failure: Secondary | ICD-10-CM

## 2023-10-29 MED ORDER — TORSEMIDE 20 MG PO TABS: 40.0000 mg | ORAL_TABLET | Freq: Two times a day (BID) | ORAL | 11 refills | Status: DC

## 2023-10-29 NOTE — Telephone Encounter (Signed)
Called to confirm/remind patient of their appointment at the [facility] on 10/30/23.   Patient reminded to bring all medications and/or complete list.  Confirmed patient has transportation. Gave directions, instructed to utilize valet parking.  Confirmed appointment prior to ending call.

## 2023-10-30 ENCOUNTER — Encounter: Payer: Self-pay | Admitting: Gastroenterology

## 2023-10-30 ENCOUNTER — Other Ambulatory Visit (HOSPITAL_COMMUNITY): Payer: Self-pay

## 2023-10-30 ENCOUNTER — Other Ambulatory Visit (INDEPENDENT_AMBULATORY_CARE_PROVIDER_SITE_OTHER): Payer: MEDICAID

## 2023-10-30 ENCOUNTER — Ambulatory Visit (HOSPITAL_COMMUNITY)
Admission: RE | Admit: 2023-10-30 | Discharge: 2023-10-30 | Disposition: A | Discharge status: Home / Self Care | Payer: MEDICAID | Source: Ambulatory Visit | Attending: Adult Health | Admitting: Adult Health

## 2023-10-30 ENCOUNTER — Ambulatory Visit (INDEPENDENT_AMBULATORY_CARE_PROVIDER_SITE_OTHER): Payer: MEDICAID | Admitting: Gastroenterology

## 2023-10-30 VITALS — BP 112/78 | HR 96 | Ht 73.0 in | Wt 289.2 lb

## 2023-10-30 VITALS — BP 116/68 | HR 97 | Wt 289.8 lb

## 2023-10-30 DIAGNOSIS — I10 Essential (primary) hypertension: Secondary | ICD-10-CM

## 2023-10-30 DIAGNOSIS — R0683 Snoring: Secondary | ICD-10-CM

## 2023-10-30 DIAGNOSIS — Z87891 Personal history of nicotine dependence: Secondary | ICD-10-CM | POA: Insufficient documentation

## 2023-10-30 DIAGNOSIS — I5022 Chronic systolic (congestive) heart failure: Secondary | ICD-10-CM | POA: Insufficient documentation

## 2023-10-30 DIAGNOSIS — F1511 Other stimulant abuse, in remission: Secondary | ICD-10-CM | POA: Insufficient documentation

## 2023-10-30 DIAGNOSIS — I110 Hypertensive heart disease with heart failure: Secondary | ICD-10-CM | POA: Diagnosis not present

## 2023-10-30 DIAGNOSIS — Z5982 Transportation insecurity: Secondary | ICD-10-CM | POA: Diagnosis not present

## 2023-10-30 DIAGNOSIS — Z79899 Other long term (current) drug therapy: Secondary | ICD-10-CM | POA: Insufficient documentation

## 2023-10-30 DIAGNOSIS — I420 Dilated cardiomyopathy: Secondary | ICD-10-CM | POA: Diagnosis not present

## 2023-10-30 DIAGNOSIS — Z5986 Financial insecurity: Secondary | ICD-10-CM | POA: Insufficient documentation

## 2023-10-30 DIAGNOSIS — Z91141 Patient's other noncompliance with medication regimen due to financial hardship: Secondary | ICD-10-CM | POA: Insufficient documentation

## 2023-10-30 DIAGNOSIS — Z1159 Encounter for screening for other viral diseases: Secondary | ICD-10-CM

## 2023-10-30 DIAGNOSIS — E119 Type 2 diabetes mellitus without complications: Secondary | ICD-10-CM

## 2023-10-30 DIAGNOSIS — G4733 Obstructive sleep apnea (adult) (pediatric): Secondary | ICD-10-CM | POA: Insufficient documentation

## 2023-10-30 DIAGNOSIS — K625 Hemorrhage of anus and rectum: Secondary | ICD-10-CM

## 2023-10-30 DIAGNOSIS — I447 Left bundle-branch block, unspecified: Secondary | ICD-10-CM | POA: Diagnosis not present

## 2023-10-30 DIAGNOSIS — Z7984 Long term (current) use of oral hypoglycemic drugs: Secondary | ICD-10-CM | POA: Insufficient documentation

## 2023-10-30 DIAGNOSIS — K5900 Constipation, unspecified: Secondary | ICD-10-CM | POA: Diagnosis not present

## 2023-10-30 DIAGNOSIS — F419 Anxiety disorder, unspecified: Secondary | ICD-10-CM | POA: Diagnosis not present

## 2023-10-30 DIAGNOSIS — Z114 Encounter for screening for human immunodeficiency virus [HIV]: Secondary | ICD-10-CM

## 2023-10-30 DIAGNOSIS — J449 Chronic obstructive pulmonary disease, unspecified: Secondary | ICD-10-CM | POA: Diagnosis not present

## 2023-10-30 DIAGNOSIS — I428 Other cardiomyopathies: Secondary | ICD-10-CM

## 2023-10-30 MED ORDER — CARVEDILOL 6.25 MG PO TABS
6.2500 mg | ORAL_TABLET | Freq: Two times a day (BID) | ORAL | 11 refills | Status: AC
  Filled 2023-10-30 – 2023-12-12 (×2): qty 60, 30d supply, fill #0
  Filled 2024-01-16: qty 60, 30d supply, fill #1
  Filled 2024-02-13: qty 60, 30d supply, fill #2
  Filled 2024-03-22: qty 60, 30d supply, fill #3
  Filled 2024-04-14: qty 60, 30d supply, fill #4
  Filled 2024-06-28: qty 60, 30d supply, fill #5
  Filled 2024-07-07: qty 60, 30d supply, fill #6

## 2023-10-30 MED ORDER — HYDROCORTISONE ACETATE 25 MG RE SUPP: 25.0000 mg | Freq: Two times a day (BID) | RECTAL | 0 refills | Status: AC

## 2023-10-30 MED ORDER — TORSEMIDE 20 MG PO TABS
40.0000 mg | ORAL_TABLET | Freq: Two times a day (BID) | ORAL | 11 refills | Status: AC
  Filled 2023-10-30 – 2023-12-17 (×3): qty 120, 30d supply, fill #0
  Filled 2024-01-16: qty 120, 30d supply, fill #1
  Filled 2024-02-13: qty 120, 30d supply, fill #2
  Filled 2024-03-22: qty 120, 30d supply, fill #3
  Filled 2024-07-07: qty 120, 30d supply, fill #4

## 2023-10-30 NOTE — Patient Instructions (Addendum)
Medication Changes:  REFILLS OF MEDICATION SENT TO [facility]--- THESE SHOULD COME IN THE MAIL  Special Instructions // Education:  SOMEONE WILL REACH OUT TO YOU REGARDING SLEEP STUDY  Follow-Up in: 6 WEEKS AS SCHEDULED WITH APP   At the [facility], you and your health needs are our priority. We have a designated team specialized in the treatment of Heart Failure. This Care Team includes your primary Heart Failure Specialized Cardiologist (physician), Advanced Practice Providers (APPs- Physician Assistants and Nurse Practitioners), and Pharmacist who all work together to provide you with the care you need, when you need it.   You may see any of the following providers on your designated Care Team at your next follow up:  Dr. Arvilla Meres Dr. Marca Ancona Dr. Dorthula Nettles Dr. Theresia Bough Tonye Becket, NP Robbie Lis, [place] [facility] Gamaliel, [place] Brynda Peon, NP [place] Lee, NP Karle Plumber, PharmD   Please be sure to bring in all your medications bottles to every appointment.   Need to Contact us:  If you have any questions or concerns before your next appointment please send [place] a message through [place] or call our office at [phone].    TO LEAVE A MESSAGE FOR THE NURSE SELECT OPTION 2, PLEASE LEAVE A MESSAGE INCLUDING: YOUR NAME DATE OF BIRTH CALL BACK NUMBER REASON FOR CALL**this is important as we prioritize the call backs  YOU WILL RECEIVE A CALL BACK THE SAME DAY AS LONG AS YOU CALL BEFORE 4:00 PM

## 2023-10-30 NOTE — Progress Notes (Signed)
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 Jimmey Ralph: Currently planning for CRT-D for his history of LBBB and heart failure planned for 11/07/2023.  Recently underwent heart cath 10/16/2023 to prepare for his defibrillator placement.  Labs 09/22/2023: Ferritin 54, iron 98, saturation 17%, normal CMP, normal TSH Labs 10/16/2023: Hgb 16.9  He has experienced rectal bleeding intermittently for the past 15 years. The bleeding occurs in episodes, typically lasting three to four days, and then may not recur for several weeks to a month. It is sometimes bright red and sometimes black, appearing on tissue paper and in the toilet. The bleeding is often associated with straining during bowel movements and is accompanied by abdominal pain. When bowel movements are regular, the bleeding is less severe, though still present to a lesser extent. Occasional constipation is noted. No current reflux or trouble swallowing.  There is a family history of colon cancer, with his grandfather diagnosed in his 80. He has not undergone a colonoscopy before.  He is scheduled to receive a defibrillator on November 07, 2023, and there is a possibility of a heart transplant in the future. He experiences shortness of breath with activity and has noted weight gain since his heart condition worsened.  In terms of social history, he drinks a significant amount of water daily, approximately ten 20-ounce bottles, and does not consume soda. He drinks two to three cups of tea daily. He is managing his weight, having gained 60 pounds since his heart issues began, and is working on improving his health. He is on a limited income, receiving $900 a  month, and spends a significant portion on medications.      Past Medical History:  Diagnosis Date   Acute on chronic systolic congestive heart failure (HCC) 05/28/2020   Formatting of this note might be different from the original. IMO update 2021   Allergy    Since grade school age   Anemia    Not sure about this   Asthma 01/1992   Chest pain 05/28/2020   Congestive heart failure (CHF) (HCC) 07/21/2020   COPD (chronic obstructive pulmonary disease) (HCC) 2014   Depression 1993   Dilated cardiomyopathy (HCC) 07/21/2020   Dyspnea on exertion 07/21/2020   Essential hypertension 05/28/2020   Generalized anxiety disorder 05/28/2020   GERD (gastroesophageal reflux disease) 1991   Heart failure (HCC)    Heart murmur Birth   LBBB (left bundle branch block) 05/28/2020   Nausea and vomiting 05/28/2020   Obstructive sleep apnea 07/21/2020   Pneumonia due to COVID-19 virus 05/28/2020   Formatting of this note might be different from the original. Diagnosed by SARS-CoV-2 PCR Cephid test at [facility] in [place], [place] on 05/28/2020.   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    Sleep apnea 2008   Substance abuse (HCC) 1996    Past Surgical History:  Procedure Laterality Date   CARDIAC CATHETERIZATION  01/2023   NO PAST SURGERIES     RIGHT HEART CATH N/A 10/16/2023   Procedure: RIGHT HEART CATH;  Surgeon: Dolores Patty, MD;  Location: [facility];  Service: Cardiovascular;  Laterality: N/A;   RIGHT/LEFT HEART CATH AND CORONARY ANGIOGRAPHY N/A 12/27/2022   Procedure:  RIGHT/LEFT HEART CATH AND CORONARY ANGIOGRAPHY;  Surgeon: Dolores Patty, MD;  Location: [facility];  Service: Cardiovascular;  Laterality: N/A;    Current Outpatient Medications  Medication Sig Dispense Refill   albuterol (PROVENTIL) (2.5 MG/3ML) 0.083% nebulizer solution Take 3 mLs (2.5 mg total) by nebulization every 6 (six) hours as needed for wheezing or shortness of  breath. 150 mL 1   budesonide-formoterol (SYMBICORT) 160-4.5 MCG/ACT inhaler Inhale 2 puffs into the lungs 2 (two) times daily. 10.2 g 3   digoxin (LANOXIN) 0.125 MG tablet Take 1 tablet (0.125 mg total) by mouth daily. 90 tablet 3   empagliflozin (JARDIANCE) 10 MG TABS tablet Take 1 tablet (10 mg total) by mouth daily before breakfast. 90 tablet 3   hydrocortisone (ANUSOL-HC) 25 MG suppository Place 1 suppository (25 mg total) rectally 2 (two) times daily for 14 days. 28 suppository 0   LORazepam (ATIVAN) 1 MG tablet Take 20-30 minutes before lab appointment 1 tablet 0   metolazone (ZAROXOLYN) 2.5 MG tablet Take 1 tablet (2.5 mg total) by mouth as directed. (Patient taking differently: Take 2.5 mg by mouth daily as needed (swelling).) 90 tablet 3   mupirocin ointment (BACTROBAN) 2 % Apply 1 Application topically 2 (two) times daily as needed. 22 g 0   potassium chloride SA (KLOR-CON M) 20 MEQ tablet Take 3 tablets (60 mEq total) by mouth 2 (two) times daily. Take an extra 2 tabs when you take metolazone     sacubitril-valsartan (ENTRESTO) 24-26 MG Take 1 tablet by mouth 2 (two) times daily. 180 tablet 3   spironolactone (ALDACTONE) 25 MG tablet Take 25 mg by mouth in the morning.     torsemide (DEMADEX) 20 MG tablet Take 2 tablets (40 mg total) by mouth 2 (two) times daily. 120 tablet 11   carvedilol (COREG) 6.25 MG tablet Take 1 tablet (6.25 mg total) by mouth 2 (two) times daily. 60 tablet 11   No current facility-administered medications for this visit.    Allergies as of 10/30/2023 - Review Complete 10/30/2023  Allergen Reaction Noted   Acetaminophen Nausea Only 05/28/2020    Family History  Problem Relation Age of Onset   COPD Mother    Heart disease Mother    Arthritis Mother    Depression Mother    Obesity Mother    Heart attack Mother    Heart attack Father    Stroke Father    Alcohol abuse Father    Drug abuse Father    Bone cancer Maternal Grandmother    Arthritis  Maternal Grandmother    Diabetes Maternal Grandmother    Early death Maternal Grandmother    Obesity Maternal Grandmother    Varicose Veins Maternal Grandmother    Stomach cancer Maternal Grandfather    Cancer Maternal Grandfather    COPD Maternal Grandfather    ADD / ADHD Daughter    Alcohol abuse Brother    Depression Brother    Learning disabilities Brother    Obesity Brother    Asthma Sister    Depression Sister    Drug abuse Sister    Miscarriages / Stillbirths Sister    Obesity Sister    Cancer Maternal Uncle    Early death Maternal Uncle    Depression Maternal Aunt    Drug abuse Maternal Aunt    Miscarriages / Stillbirths Maternal Aunt     Social History   Socioeconomic History   Marital status: Divorced    Spouse name: Not on  file   Number of children: Not on file   Years of education: Not on file   Highest education level: GED or equivalent  Occupational History   Not on file  Tobacco Use   Smoking status: Former    Current packs/day: 0.00    Average packs/day: 1 pack/day for 15.0 years (15.0 ttl pk-yrs)    Types: Cigarettes, E-cigarettes    Quit date: 07/21/2013    Years since quitting: 10.2   Smokeless tobacco: Never   Tobacco comments:    Quit when I was told I had copd  Vaping Use   Vaping status: Never Used  Substance and Sexual Activity   Alcohol use: Not Currently    Comment: Never drank more than a drink with dinner maybe 20 times   Drug use: Not Currently    Types: Cocaine, Fentanyl, Heroin, Hydrocodone, Ketamine, Marijuana, MDMA (Ecstacy), Methamphetamines, Morphine, Oxycodone, Other-see comments    Comment: These are from over 20 years ago.   Sexual activity: Not Currently    Birth control/protection: Abstinence, Condom, None  Other Topics Concern   Not on file  Social History Narrative   Not on file   Social Drivers of Health   Financial Resource Strain: Medium Risk (10/16/2023)   Overall Financial Resource Strain (CARDIA)     Difficulty of Paying Living Expenses: Somewhat hard  Food Insecurity: Food Insecurity Present (10/16/2023)   Hunger Vital Sign    Worried About Running Out of Food in the Last Year: Often true    Ran Out of Food in the Last Year: Sometimes true  Transportation Needs: Unmet Transportation Needs (10/16/2023)   PRAPARE - Administrator, Civil Service (Medical): Yes    Lack of Transportation (Non-Medical): Yes  Physical Activity: Unknown (10/16/2023)   Exercise Vital Sign    Days of Exercise per Week: 0 days    Minutes of Exercise per Session: Not on file  Stress: Stress Concern Present (10/16/2023)   [employer] of Occupational Health - Occupational Stress Questionnaire    Feeling of Stress : Very much  Social Connections: Socially Isolated (10/16/2023)   Social Connection and Isolation Panel [NHANES]    Frequency of Communication with Friends and Family: Once a week    Frequency of Social Gatherings with Friends and Family: Never    Attends Religious Services: Never    Database administrator or Organizations: No    Attends Engineer, structural: Not on file    Marital Status: Divorced  Catering manager Violence: Not on file    Review of Systems:    Constitutional: No weight loss, fever, chills, weakness or fatigue HEENT: Eyes: No change in vision               Ears, Nose, Throat:  No change in hearing or congestion Skin: No rash or itching Cardiovascular: No chest pain, chest pressure or palpitations   Respiratory: No SOB or cough Gastrointestinal: See HPI and otherwise negative Genitourinary: No dysuria or change in urinary frequency Neurological: No headache, dizziness or syncope Musculoskeletal: No new muscle or joint pain Hematologic: No bleeding or bruising Psychiatric: No history of depression or anxiety    Physical Exam:  Vital signs: BP 112/78 (BP Location: Left Arm, Patient Position: Sitting, Cuff Size: Normal)   Pulse 96   Ht 6\' 1"  (1.854 m)    Wt 289 lb 4 oz (131.2 kg)   SpO2 95%   BMI 38.16 kg/m   Constitutional: NAD, Well developed,  Well nourished, alert and cooperative. Becomes short of breath when getting on exam table. Head:  Normocephalic and atraumatic. Eyes:   PEERL, EOMI. No icterus. Conjunctiva pink. Respiratory: Respirations even and unlabored. Lungs clear to auscultation bilaterally.   No wheezes, crackles, or rhonchi.  Cardiovascular:  Regular rate and rhythm. No peripheral edema, cyanosis or pallor.  Gastrointestinal:  Soft, nondistended, nontender. No rebound or guarding. Normal bowel sounds. No appreciable masses or hepatomegaly. Rectal:  Not performed. Patient declined DRE and anoscope. Msk:  Symmetrical without gross deformities. Without edema, no deformity or joint abnormality.  Neurologic:  Alert and  oriented x4;  grossly normal neurologically.  Skin:   Dry and intact without significant lesions or rashes. Psychiatric: Oriented to person, place and time. Demonstrates good judgement and reason without abnormal affect or behaviors.   RELEVANT LABS AND IMAGING: CBC    Component Value Date/Time   WBC 7.6 10/16/2023 0551   RBC 5.95 (H) 10/16/2023 0551   HGB 16.7 10/16/2023 0804   HCT 49.0 10/16/2023 0804   PLT 220 10/16/2023 0551   MCV 87.2 10/16/2023 0551   MCH 28.4 10/16/2023 0551   MCHC 32.6 10/16/2023 0551   RDW 14.7 10/16/2023 0551    CMP     Component Value Date/Time   NA 138 10/16/2023 0804   NA 137 09/05/2023 0955   K 3.3 (L) 10/16/2023 0804   CL 96 (L) 09/22/2023 1425   CO2 28 09/22/2023 1425   GLUCOSE 93 09/22/2023 1425   BUN 16 09/22/2023 1425   BUN 19 09/05/2023 0955   CREATININE 1.22 09/22/2023 1425   CALCIUM 9.2 09/22/2023 1425   PROT 8.1 09/22/2023 1425   ALBUMIN 3.5 09/22/2023 1425   AST 39 09/22/2023 1425   ALT 28 09/22/2023 1425   ALKPHOS 79 09/22/2023 1425   BILITOT 1.1 09/22/2023 1425   GFRNONAA >60 09/22/2023 1425     Assessment/Plan:   48 year old male  history of chronic systolic heart failure 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 Scheduled for defibrillator placement next week.    Rectal Bleeding Constipation Chronic intermittent rectal bleeding for 15 years, with episodes of increased frequency and intensity associated with straining and abdominal pain. Stool color varies from bright red to black. Family history of colon cancer in grandfather. Likely hemorrhoids, but need for colonoscopy is acknowledged, pending cardiac clearance.  Unfortunately patient declined rectal exam today.  At this time he is too high risk to undergo endoscopic procedures with plans for defibrillator placement next week.  No anemia on CBC is reassuring and no weight loss. -- Start daily fiber supplement (Benefiber or Metamucil).  If no improvement can add on MiraLAX -- Continue drinking at least 64 ounces of water daily. -- Check in at end of March after defibrillator placement to discuss colonoscopy. - Provided Calmol 4 suppository samples and recommend doing hydrocortisone suppositories twice daily for 14 days - Follow-up 3 to 6 months  CHF Dilated cardiomyopathy LBBB Pending defibrillator placement on 11/07/2023. Patient reports shortness of breath with activity. Possible future heart transplant per patient report. Visibly short of breath when getting on exam table today. -Defibrillator placement takes precedence over colonoscopy at this time. -Discuss colonoscopy with cardiologist post defibrillator placement.  Will ultimately need cardiac clearance prior to undergoing endoscopic procedures  Follow-up in 6 months in person, with interim monitoring via chart review.     Assigned to  Dr. Chales Abrahams today  Boone Master, PA-C [facility] 10/30/2023, 11:25 AM  Cc: Alfredia Ferguson, PA-C

## 2023-10-30 NOTE — Progress Notes (Signed)
H&V Care Navigation CSW Progress Note  Clinical Social Worker consulted to speak with pt about medication cost concerns.   Pt reports he has too many medications and MD visits to keep up with $4 copays reliably given his fixed income of around $900/month through SSDI.  CSW spoke with pt about transferring to [employer] for mail order service and to set up AR account so he doesn't have to pay everything at once if he is unable to- pt agreeable.  Staff helping to transfer our medications and pt instructed to ask other provider to transfer scripts to [facility].  Made sure he had their number and instructed to call when he has a week or less of medications so they can mail it to him in time.  Provided my number if further questions arise.  SDOH Screenings   Food Insecurity: Food Insecurity Present (10/16/2023)  Housing: High Risk (10/16/2023)  Transportation Needs: Unmet Transportation Needs (10/16/2023)  Alcohol Screen: Low Risk  (10/16/2023)  Depression (PHQ2-9): Low Risk  (10/23/2023)  Financial Resource Strain: Medium Risk (10/16/2023)  Physical Activity: Unknown (10/16/2023)  Social Connections: Socially Isolated (10/16/2023)  Stress: Stress Concern Present (10/16/2023)  Tobacco Use: Medium Risk (10/30/2023)    Burna Sis, LCSW Clinical Social Worker [facility] Desk#: [phone] Cell#: [phone]

## 2023-10-30 NOTE — Patient Instructions (Addendum)
We have sent the following medications to your pharmacy for you to pick up at your convenience: Hydrocortisone suppositories insert twice daily x 14 days.  If too expensive then purchase over the counter preparation H suppositories and over the counter hydrocortisone cream. Coat the cream on suppository before inserting suppository.   We also gave you Calmol suppositories to use as needed.   _______________________________________________________  If your blood pressure at your visit was 140/90 or greater, please contact your primary care physician to follow up on this.  _______________________________________________________  If you are age 47 or older, your body mass index should be between 23-30. Your Body mass index is 38.16 kg/m. If this is out of the aforementioned range listed, please consider follow up with your Primary Care Provider.  If you are age 67 or younger, your body mass index should be between 19-25. Your Body mass index is 38.16 kg/m. If this is out of the aformentioned range listed, please consider follow up with your Primary Care Provider.   ________________________________________________________  The [facility] providers would like to encourage you to use [facility] to communicate with providers for non-urgent requests or questions.  Due to long hold times on the telephone, sending your provider a message by [facility] may be a faster and more efficient way to get a response.  Please allow 48 business hours for a response.  Please remember that this is for non-urgent requests.  _______________________________________________________

## 2023-10-30 NOTE — Addendum Note (Signed)
Encounter addended by: Burna Sis, LCSW on: 10/30/2023 4:02 PM  Actions taken: Clinical Note Signed

## 2023-10-30 NOTE — Progress Notes (Signed)
ReDS Vest / Clip - 10/30/23 1326       ReDS Vest / Clip   Station Marker D    Ruler Value 36.5    ReDS Value Range Low volume    ReDS Actual Value 29

## 2023-10-30 NOTE — Progress Notes (Signed)
 ADVANCED HF CLINIC NOTE  Primary Care: Alfredia Ferguson, PA-C HF Cardiologist: Dr. Gala Romney  HPI: 48 y.o. male with chronic systolic CHF with EF 10% due to presumed NICM, LBBB, HTN, OSA, COPD, anxiety, prior tobacco use, and polysubstance abuse.  Diagnosed with HF years ago while admitted to a hospital in [place] with CHF. No records from admission available. There was mention of possible LVAD placement and was discharged with LifeVest. He lost insurance and was not seen for follow-up.   He established care with Dr. Bing Matter in 11/21. Last seen in June 2022. Admitted to psych unit in September 2022 for detox from heroin. He had episode of syncope/? Possible asystole. Had very brief CPR. He was transferred to ICU at [facility]. He was seen by Cardiology. Echo with EF 15-20%. EP considered placement of CRT-D. Defibrillator placement deferred until social situation more stable (he was homeless at the time). Did not follow-up with Cardiology after discharge.   Seen in ED at [facility] on 10/03/21 with chest tightness and dyspnea. Left before evaluated. Represented with a/c CHF. Had been out of medications 2.5 months. UDS + for amphetamines. Echo  EF < 20%, RV moderate to severely reduced, mild MR, dilated IVC. He refused cath or other HF therapies.   Cath 4/24: Normal cors. EF < 20% RA 8 PA 57/32 (42) PCW 17 Fick 5.5/2.2 PVR 4.5  Echo 1/24 EF 20-25%  RV normal   CPX 5/24 FVC 5.01 (85%)      FEV1 4.02 (87%)        FEV1/FVC 80 (101%)        BP rest: 126/70 Standing BP: 120/74 BP peak: 168/74  Peak VO2: 19.4 (70% predicted peak VO2) - when corrected for ibw 28.3 ml/kg/min VE/VCO2 slope:  37  Peak RER: 1.09   Seen back on 12/27 for f/u. Lower extremity edema improved after he took a dose of metolazone but did not notice much change in UOP with higher dose of lasix.   Had RHC 10/16/23 which showed elevated filling pressures, CO 4.5, and CI 1.8.    Today he returns for HF follow up.Overall feeling a  little better. SOB with steps and inclines. Denies PND/Orthopnea. Appetite ok. No fever or chills. Weight at home 285-289  pounds. Taking all medications but ran our coreg 3 days ago and torsemide today because he had no money to pick of medications. Has not used drungs in > 1 year.  Has difficulty getting medications and making ends meet. Getting disability and food stamps.   Plan for CRT-D next week.   Cardiac Studies - Echo (1/23): EF < 20%, RV moderate to severely reduced, mild MR, dilated IVC.  - Echo (11/21) [facility] EF 15-20%  - Echo 1/24 EF 20-25%, RV normal  - RHC 10/16/23  RA = 12 RV = 61/15 PA = 59/43 (48) PCW = 19 Fick cardiac output/index = 4.5/1.8 PVR = 6.4 Ao sat = 95% PA sat = 62%, 63% PAPi = 3,5  Assessment: 1. Biventricular HF with moderate mixed pulmonary HTN and low output  - [facility] 1/24  Findings:  Ao = 88/76  (81) LV = 102/19 RA = 8 RV = 53/13 PA = 57/32 (42) PCW = 17 Fick cardiac output/index = 5.5/2.2 PVR = 4.5 Ao sat = 94% PA sat = 67%, 67%  Assessment:  1. Normal coronary arteries 2. Severe NICM EF < 20% 3. Moderate mixed pulmonary HTN with moderately reduced cardiac output  Past Medical History:  Diagnosis Date  Acute on chronic systolic congestive heart failure (HCC) 05/28/2020   Formatting of this note might be different from the original. IMO update 2021   Allergy    Since grade school age   Anemia    Not sure about this   Asthma 01/1992   Chest pain 05/28/2020   Congestive heart failure (CHF) (HCC) 07/21/2020   COPD (chronic obstructive pulmonary disease) (HCC) 2014   Depression 1993   Dilated cardiomyopathy (HCC) 07/21/2020   Dyspnea on exertion 07/21/2020   Essential hypertension 05/28/2020   Generalized anxiety disorder 05/28/2020   GERD (gastroesophageal reflux disease) 1991   Heart failure (HCC)    Heart murmur Birth   LBBB (left bundle branch block) 05/28/2020   Nausea and vomiting 05/28/2020   Obstructive sleep apnea  07/21/2020   Pneumonia due to COVID-19 virus 05/28/2020   Formatting of this note might be different from the original. Diagnosed by SARS-CoV-2 PCR Cephid test at [facility] in [place], [place] on 05/28/2020.   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    Sleep apnea 2008   Substance abuse (HCC) 1996   Current Outpatient Medications  Medication Sig Dispense Refill   albuterol (PROVENTIL) (2.5 MG/3ML) 0.083% nebulizer solution Take 3 mLs (2.5 mg total) by nebulization every 6 (six) hours as needed for wheezing or shortness of breath. (Patient taking differently: Take 2.5 mg by nebulization every 6 (six) hours as needed for wheezing or shortness of breath. Every morning) 150 mL 1   budesonide-formoterol (SYMBICORT) 160-4.5 MCG/ACT inhaler Inhale 2 puffs into the lungs 2 (two) times daily. 10.2 g 3   digoxin (LANOXIN) 0.125 MG tablet Take 1 tablet (0.125 mg total) by mouth daily. 90 tablet 3   empagliflozin (JARDIANCE) 10 MG TABS tablet Take 1 tablet (10 mg total) by mouth daily before breakfast. 90 tablet 3   hydrocortisone (ANUSOL-HC) 25 MG suppository Place 1 suppository (25 mg total) rectally 2 (two) times daily for 14 days. 28 suppository 0   LORazepam (ATIVAN) 1 MG tablet Take 20-30 minutes before lab appointment 1 tablet 0   metolazone (ZAROXOLYN) 2.5 MG tablet Take 1 tablet (2.5 mg total) by mouth as directed. (Patient taking differently: Take 2.5 mg by mouth daily as needed (swelling).) 90 tablet 3   mupirocin ointment (BACTROBAN) 2 % Apply 1 Application topically 2 (two) times daily as needed. 22 g 0   sacubitril-valsartan (ENTRESTO) 24-26 MG Take 1 tablet by mouth 2 (two) times daily. 180 tablet 3   spironolactone (ALDACTONE) 25 MG tablet Take 25 mg by mouth in the morning.     carvedilol (COREG) 6.25 MG tablet Take 1 tablet (6.25 mg total) by mouth 2 (two) times daily. (Patient not taking: Reported on 10/30/2023) 60 tablet 11   potassium chloride SA (KLOR-CON M) 20  MEQ tablet Take 3 tablets (60 mEq total) by mouth 2 (two) times daily. Take an extra 2 tabs when you take metolazone (Patient not taking: Reported on 10/30/2023)     torsemide (DEMADEX) 20 MG tablet Take 2 tablets (40 mg total) by mouth 2 (two) times daily. (Patient not taking: Reported on 10/30/2023) 120 tablet 11   No current facility-administered medications for this encounter.   Allergies  Allergen Reactions   Acetaminophen Nausea Only   Social History   Socioeconomic History   Marital status: Divorced    Spouse name: Not on file   Number of children: Not on file   Years of education: Not on  file   Highest education level: GED or equivalent  Occupational History   Not on file  Tobacco Use   Smoking status: Former    Current packs/day: 0.00    Average packs/day: 1 pack/day for 15.0 years (15.0 ttl pk-yrs)    Types: Cigarettes, E-cigarettes    Quit date: 07/21/2013    Years since quitting: 10.2   Smokeless tobacco: Never   Tobacco comments:    Quit when I was told I had copd  Vaping Use   Vaping status: Never Used  Substance and Sexual Activity   Alcohol use: Not Currently    Comment: Never drank more than a drink with dinner maybe 20 times   Drug use: Not Currently    Types: Cocaine, Fentanyl, Heroin, Hydrocodone, Ketamine, Marijuana, MDMA (Ecstacy), Methamphetamines, Morphine, Oxycodone, Other-see comments    Comment: These are from over 20 years ago.   Sexual activity: Not Currently    Birth control/protection: Abstinence, Condom, None  Other Topics Concern   Not on file  Social History Narrative   Not on file   Social Drivers of Health   Financial Resource Strain: Medium Risk (10/16/2023)   Overall Financial Resource Strain (CARDIA)    Difficulty of Paying Living Expenses: Somewhat hard  Food Insecurity: Food Insecurity Present (10/16/2023)   Hunger Vital Sign    Worried About Programme researcher, broadcasting/film/video in the Last Year: Often true    Ran Out of Food in the Last Year:  Sometimes true  Transportation Needs: Unmet Transportation Needs (10/16/2023)   PRAPARE - Administrator, Civil Service (Medical): Yes    Lack of Transportation (Non-Medical): Yes  Physical Activity: Unknown (10/16/2023)   Exercise Vital Sign    Days of Exercise per Week: 0 days    Minutes of Exercise per Session: Not on file  Stress: Stress Concern Present (10/16/2023)   [employer] of Occupational Health - Occupational Stress Questionnaire    Feeling of Stress : Very much  Social Connections: Socially Isolated (10/16/2023)   Social Connection and Isolation Panel [NHANES]    Frequency of Communication with Friends and Family: Once a week    Frequency of Social Gatherings with Friends and Family: Never    Attends Religious Services: Never    Database administrator or Organizations: No    Attends Engineer, structural: Not on file    Marital Status: Divorced  Catering manager Violence: Not on file   Family History  Problem Relation Age of Onset   COPD Mother    Heart disease Mother    Arthritis Mother    Depression Mother    Obesity Mother    Heart attack Mother    Heart attack Father    Stroke Father    Alcohol abuse Father    Drug abuse Father    Bone cancer Maternal Grandmother    Arthritis Maternal Grandmother    Diabetes Maternal Grandmother    Early death Maternal Grandmother    Obesity Maternal Grandmother    Varicose Veins Maternal Grandmother    Stomach cancer Maternal Grandfather    Cancer Maternal Grandfather    COPD Maternal Grandfather    ADD / ADHD Daughter    Alcohol abuse Brother    Depression Brother    Learning disabilities Brother    Obesity Brother    Asthma Sister    Depression Sister    Drug abuse Sister    Miscarriages / Stillbirths Sister    Obesity Sister  Cancer Maternal Uncle    Early death Maternal Uncle    Depression Maternal Aunt    Drug abuse Maternal Aunt    Miscarriages / Stillbirths Maternal Aunt    BP  116/68   Pulse 97   Wt 131.5 kg (289 lb 12.8 oz)   SpO2 96%   BMI 38.23 kg/m   Wt Readings from Last 3 Encounters:  10/30/23 131.5 kg (289 lb 12.8 oz)  10/30/23 131.2 kg (289 lb 4 oz)  10/23/23 131.3 kg (289 lb 6 oz)   PHYSICAL EXAM: General:  Walked in the clinic Neck: supple. no JVD. Carotids 2+ bilat; no bruits.  Cor: PMI nondisplaced. Regular rate & rhythm. No rubs, gallops or murmurs. Lungs: clear Abdomen: soft, nontender, nondistended. No hepatosplenomegaly. No bruits or masses. Good bowel sounds. Extremities: no cyanosis, clubbing, rash, edema Neuro: alert & orientedx3, cranial nerves grossly intact. moves all 4 extremities w/o difficulty. Affect pleasant   ASSESSMENT & PLAN: 1. Chronic Systolic CHF, NICM  ? Etiology previous substance abuse and LBBB. He seems to be on track with his medications.  - Diagnosed February 2021 while admitted for HF in [place]. EF 10% at that time -? Related to substance abuse. EF has been severely reduced since that time.  - Cath 4/24: Normal cors. EF < 20% RA 8 PA 57/32 (42) PCW 17 Fick 5.5/2.2 PVR 4.5 - CPX 5/24 pVO2: 19.4 (70% predicted peak VO2) - corrected for ibw 28.3 ml/kg/min. VE/VCO2  37  pRER: 1.09  -RHC 10/16/23 - RA 12 PCWP 19 CO 4.5 CI 1.8.  - ReDs reading: 29 %, normal.  - NYHA III. Volume status stable. Refill torsemide 40 mg twice a day and continue  - Continue Torsemide 40 BID. Can take metolazone 2.5 mg PRN.  - Continue digoxin 0.125 mg daily.  - Restart carvedilol 6.25 mg bid. . - Continue spironolactone 25 mg daily. - Continue  Entresto 24/26 mg bid. - Continue Jardiance 10 mg daily. - Plan for CRT-D next week.  - Check BMET has labs set up PCP.  Plan for CRT-D next week.   2. LBBB -QRS 176 ms  -Plan for CRT-D next week.   3. HTN - Controlled continue current regimen.    4. H/O Polysubstance abuse - Hx heroin and meth abuse.  - UDS + for amphetamines during 2023 admission - He has not used in the last couple  of years.   5. Snoring - Set up sleep study. He waiting to get scheduled.   Send coreg and torsemide to [facility] outpatient pharmacy.   Follow up 6 weeks with Dr Richrd Humbles  Tonye Becket, NP  1:31 PM

## 2023-10-31 ENCOUNTER — Encounter: Payer: Self-pay | Admitting: Internal Medicine

## 2023-10-31 ENCOUNTER — Telehealth: Payer: Self-pay | Admitting: Pharmacy Technician

## 2023-10-31 ENCOUNTER — Other Ambulatory Visit (HOSPITAL_COMMUNITY): Payer: Self-pay

## 2023-10-31 LAB — HEPATITIS C ANTIBODY: Hepatitis C Ab: NONREACTIVE

## 2023-10-31 LAB — HIV ANTIBODY (ROUTINE TESTING W REFLEX): HIV 1&2 Ab, 4th Generation: NONREACTIVE

## 2023-10-31 LAB — HEMOGLOBIN A1C: Hgb A1c MFr Bld: 7.1 % — ABNORMAL HIGH (ref 4.6–6.5)

## 2023-10-31 NOTE — Telephone Encounter (Signed)
Pharmacy Patient Advocate Encounter   Received notification from CoverMyMeds that prior authorization for ANUCORT 25MG  SUPP is required/requested.   Insurance verification completed.   The patient is insured through [facility] .   Per test claim: PRODUCT/SERVICE NOT COVERED.

## 2023-11-03 NOTE — Telephone Encounter (Signed)
 Noted. Patient was given additional over the counter options if Anucort was not covered.

## 2023-11-04 ENCOUNTER — Encounter: Payer: Self-pay | Admitting: Physician Assistant

## 2023-11-06 NOTE — Pre-Procedure Instructions (Signed)
 Attempted to call patient regarding procedure instructions.  Left voicemail on the following items: Arrival time 1000 Nothing to eat or drink after midnight No meds AM of procedure Responsible person to drive you home and stay with you for 24 hrs Wash with special soap night before and morning of procedure

## 2023-11-07 ENCOUNTER — Ambulatory Visit (HOSPITAL_COMMUNITY): Admission: RE | Admit: 2023-11-07 | Payer: MEDICAID | Source: Ambulatory Visit | Admitting: Cardiology

## 2023-11-07 ENCOUNTER — Encounter (HOSPITAL_COMMUNITY): Admission: RE | Payer: MEDICAID | Source: Ambulatory Visit

## 2023-11-07 SURGERY — BIV ICD INSERTION CRT-D

## 2023-11-10 ENCOUNTER — Encounter: Payer: Self-pay | Admitting: Family Medicine

## 2023-11-10 ENCOUNTER — Telehealth: Payer: MEDICAID | Admitting: Family Medicine

## 2023-11-10 DIAGNOSIS — J111 Influenza due to unidentified influenza virus with other respiratory manifestations: Secondary | ICD-10-CM | POA: Diagnosis not present

## 2023-11-10 MED ORDER — OSELTAMIVIR PHOSPHATE 75 MG PO CAPS: 75.0000 mg | ORAL_CAPSULE | Freq: Two times a day (BID) | ORAL | 0 refills | Status: DC

## 2023-11-10 NOTE — Progress Notes (Addendum)
 [facility] [address] [place], [place] [address] [phone]  Date:  11/10/2023   Name:  Cameron Jenkins   DOB:  Jun 01, 1976   MRN:  [id number]  PCP:  Alfredia Ferguson, PA-C    Chief Complaint: No chief complaint on file.   History of Present Illness:  Cameron Jenkins is a 48 y.o. very pleasant male patient who presents with the following:  Pt seen today with concern of illness- virtual visit today Pt of Alfredia Ferguson, PA-C I have not seen him myself in the past  He is a pt at advanced HF clinic - seen about 10 days ago. They plan to do an ICD for him soon Pt location is home, my location is office Pt ID confirmed with 2 factors, he gives consent for a virtual visit today The pt and myself are present on the call today   48 y.o. male with chronic systolic CHF with EF 10% due to presumed NICM, LBBB, HTN, OSA, COPD, anxiety, prior tobacco use, and polysubstance abuse.   Today is monday Pt notes that on Saturday night he started to feel ill Yesterday/ sunday he had a temp to about 100.2, he had aches and sweats.   He is coughing  No vomiting or diarrhea He feels terrible today  He feels congested in his chest  Denies any SOB or distress   Pulse Readings from Last 3 Encounters:  10/30/23 97  10/30/23 96  10/23/23 (!) 101     Patient Active Problem List   Diagnosis Date Noted   Rectal bleeding 10/23/2023   Situational anxiety 10/23/2023   Skin lesion 10/23/2023   Type 2 diabetes mellitus without complication (HCC) 10/09/2021   History of cardiac arrest 10/05/2021   Opioid use disorder, severe, dependence (HCC) 06/09/2021   Homeless 06/08/2021   Polysubstance abuse (HCC) 06/08/2021   Significant birth injury of newborn    Congestive heart failure (CHF) (HCC) 07/21/2020   Dilated cardiomyopathy (HCC) 07/21/2020   Dyspnea on exertion 07/21/2020   Obstructive sleep apnea 07/21/2020   COPD (chronic  obstructive pulmonary disease) (HCC) 05/28/2020   Essential hypertension 05/28/2020   Generalized anxiety disorder 05/28/2020   LBBB (left bundle branch block) 05/28/2020    Past Medical History:  Diagnosis Date   Acute on chronic systolic congestive heart failure (HCC) 05/28/2020   Formatting of this note might be different from the original. IMO update 2021   Allergy    Since grade school age   Anemia    Not sure about this   Asthma 01/1992   Chest pain 05/28/2020   Congestive heart failure (CHF) (HCC) 07/21/2020   COPD (chronic obstructive pulmonary disease) (HCC) 2014   Depression 1993   Dilated cardiomyopathy (HCC) 07/21/2020   Dyspnea on exertion 07/21/2020   Essential hypertension 05/28/2020   Generalized anxiety disorder 05/28/2020   GERD (gastroesophageal reflux disease) 1991   Heart failure (HCC)    Heart murmur Birth   LBBB (left bundle branch block) 05/28/2020   Nausea and vomiting 05/28/2020   Obstructive sleep apnea 07/21/2020   Pneumonia due to COVID-19 virus 05/28/2020   Formatting of this note might be different from the original. Diagnosed by SARS-CoV-2 PCR Cephid test at [facility] in [place], [place] on 05/28/2020.   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    Sleep apnea 2008   Substance abuse (HCC) 1996    Past  Surgical History:  Procedure Laterality Date   CARDIAC CATHETERIZATION  01/2023   NO PAST SURGERIES     RIGHT HEART CATH N/A 10/16/2023   Procedure: RIGHT HEART CATH;  Surgeon: Dolores Patty, MD;  Location: [facility] INVASIVE CV LAB;  Service: Cardiovascular;  Laterality: N/A;   RIGHT/LEFT HEART CATH AND CORONARY ANGIOGRAPHY N/A 12/27/2022   Procedure: RIGHT/LEFT HEART CATH AND CORONARY ANGIOGRAPHY;  Surgeon: Dolores Patty, MD;  Location: [facility] INVASIVE CV LAB;  Service: Cardiovascular;  Laterality: N/A;    Social History   Tobacco Use   Smoking status: Former    Current packs/day: 0.00    Average packs/day: 1  pack/day for 15.0 years (15.0 ttl pk-yrs)    Types: Cigarettes, E-cigarettes    Quit date: 07/21/2013    Years since quitting: 10.3   Smokeless tobacco: Never   Tobacco comments:    Quit when I was told I had copd  Vaping Use   Vaping status: Never Used  Substance Use Topics   Alcohol use: Not Currently    Comment: Never drank more than a drink with dinner maybe 20 times   Drug use: Not Currently    Types: Cocaine, Fentanyl, Heroin, Hydrocodone, Ketamine, Marijuana, MDMA (Ecstacy), Methamphetamines, Morphine, Oxycodone, Other-see comments    Comment: These are from over 20 years ago.    Family History  Problem Relation Age of Onset   COPD Mother    Heart disease Mother    Arthritis Mother    Depression Mother    Obesity Mother    Heart attack Mother    Heart attack Father    Stroke Father    Alcohol abuse Father    Drug abuse Father    Bone cancer Maternal Grandmother    Arthritis Maternal Grandmother    Diabetes Maternal Grandmother    Early death Maternal Grandmother    Obesity Maternal Grandmother    Varicose Veins Maternal Grandmother    Stomach cancer Maternal Grandfather    Cancer Maternal Grandfather    COPD Maternal Grandfather    ADD / ADHD Daughter    Alcohol abuse Brother    Depression Brother    Learning disabilities Brother    Obesity Brother    Asthma Sister    Depression Sister    Drug abuse Sister    Miscarriages / Stillbirths Sister    Obesity Sister    Cancer Maternal Uncle    Early death Maternal Uncle    Depression Maternal Aunt    Drug abuse Maternal Aunt    Miscarriages / Stillbirths Maternal Aunt     Allergies  Allergen Reactions   Acetaminophen Nausea Only    Medication list has been reviewed and updated.  Current Outpatient Medications on File Prior to Visit  Medication Sig Dispense Refill   albuterol (PROVENTIL) (2.5 MG/3ML) 0.083% nebulizer solution Take 3 mLs (2.5 mg total) by nebulization every 6 (six) hours as needed for  wheezing or shortness of breath. (Patient taking differently: Take 2.5 mg by nebulization in the morning.) 150 mL 1   budesonide-formoterol (SYMBICORT) 160-4.5 MCG/ACT inhaler Inhale 2 puffs into the lungs 2 (two) times daily. (Patient taking differently: Inhale 1 puff into the lungs in the morning, at noon, and at bedtime.) 10.2 g 3   carvedilol (COREG) 6.25 MG tablet Take 1 tablet (6.25 mg total) by mouth 2 (two) times daily. 60 tablet 11   digoxin (LANOXIN) 0.125 MG tablet Take 1 tablet (0.125 mg total) by mouth daily. 90 tablet  3   empagliflozin (JARDIANCE) 10 MG TABS tablet Take 1 tablet (10 mg total) by mouth daily before breakfast. 90 tablet 3   hydrocortisone (ANUSOL-HC) 25 MG suppository Place 1 suppository (25 mg total) rectally 2 (two) times daily for 14 days. 28 suppository 0   LORazepam (ATIVAN) 1 MG tablet Take 20-30 minutes before lab appointment 1 tablet 0   metolazone (ZAROXOLYN) 2.5 MG tablet Take 1 tablet (2.5 mg total) by mouth as directed. (Patient taking differently: Take 2.5 mg by mouth daily as needed (swelling).) 90 tablet 3   mupirocin ointment (BACTROBAN) 2 % Apply 1 Application topically 2 (two) times daily as needed. 22 g 0   potassium chloride SA (KLOR-CON M) 20 MEQ tablet Take 3 tablets (60 mEq total) by mouth 2 (two) times daily. Take an extra 2 tabs when you take metolazone     sacubitril-valsartan (ENTRESTO) 24-26 MG Take 1 tablet by mouth 2 (two) times daily. 180 tablet 3   spironolactone (ALDACTONE) 25 MG tablet Take 25 mg by mouth in the morning.     torsemide (DEMADEX) 20 MG tablet Take 2 tablets (40 mg total) by mouth 2 (two) times daily. 120 tablet 11   No current facility-administered medications on file prior to visit.    Review of Systems:  As per HPI- otherwise negative.   Physical Examination: There were no vitals filed for this visit. There were no vitals filed for this visit. There is no height or weight on file to calculate BMI. Ideal Body  Weight:   Asked pt to take vitals at home- 99% Pulse 113  Pt took a home test for covid and flu- he notes both flu A and flu B show positive, control positive, covid negative  Pt observed via mychart video,  no distress but he does look like someone with the flu  Assessment and Plan: Influenza - Plan: oseltamivir (TAMIFLU) 75 MG capsule Pt seen virtually today for concern of flu- like symptoms, he tested + for both flu A and B.  I recommended that he come to the ER due to his heart issues and tachycardia,to make sure his flu illness is not becoming dangerous.  He declines for now- he lives out of town and does not want to drive in. However he did hear this advice and plans to come in if getting worse or not improving Will start tamiflu   Signed Abbe Amsterdam, MD

## 2023-11-14 ENCOUNTER — Other Ambulatory Visit (HOSPITAL_COMMUNITY): Payer: Self-pay

## 2023-11-20 ENCOUNTER — Ambulatory Visit: Payer: MEDICAID

## 2023-11-21 ENCOUNTER — Ambulatory Visit: Payer: MEDICAID | Admitting: Gastroenterology

## 2023-11-28 ENCOUNTER — Telehealth: Payer: Self-pay

## 2023-11-28 NOTE — Telephone Encounter (Signed)
 LM for pt to call back to reschedule his ICD Implant with Dr. Jimmey Ralph.

## 2023-12-10 ENCOUNTER — Telehealth (HOSPITAL_COMMUNITY): Payer: Self-pay

## 2023-12-10 ENCOUNTER — Other Ambulatory Visit (HOSPITAL_COMMUNITY): Payer: Self-pay

## 2023-12-10 NOTE — Telephone Encounter (Addendum)
 Advanced Heart Failure Patient Advocate Encounter  Prior authorization for [place] Pepper has been submitted and approved. Test billing returns $4 for 90 day supply.  Key: [id number] Effective: 12/10/2023 to 12/09/2024  Burnell Blanks, CPhT Rx Patient Advocate Phone: [phone]

## 2023-12-12 ENCOUNTER — Other Ambulatory Visit: Payer: Self-pay | Admitting: Physician Assistant

## 2023-12-13 ENCOUNTER — Other Ambulatory Visit (HOSPITAL_COMMUNITY): Payer: Self-pay

## 2023-12-15 ENCOUNTER — Other Ambulatory Visit (HOSPITAL_COMMUNITY): Payer: Self-pay

## 2023-12-15 ENCOUNTER — Other Ambulatory Visit: Payer: Self-pay

## 2023-12-16 ENCOUNTER — Other Ambulatory Visit (HOSPITAL_COMMUNITY): Payer: Self-pay

## 2023-12-16 MED ORDER — SPIRONOLACTONE 25 MG PO TABS
25.0000 mg | ORAL_TABLET | Freq: Every morning | ORAL | 2 refills | Status: DC
  Filled 2023-12-16 – 2024-01-16 (×3): qty 90, 90d supply, fill #0

## 2023-12-17 ENCOUNTER — Other Ambulatory Visit: Payer: Self-pay

## 2023-12-17 ENCOUNTER — Encounter: Payer: Self-pay | Admitting: Pharmacist

## 2023-12-17 ENCOUNTER — Other Ambulatory Visit (HOSPITAL_COMMUNITY): Payer: Self-pay | Admitting: Internal Medicine

## 2023-12-17 ENCOUNTER — Other Ambulatory Visit (HOSPITAL_COMMUNITY): Payer: Self-pay

## 2023-12-18 ENCOUNTER — Other Ambulatory Visit: Payer: Self-pay

## 2023-12-18 ENCOUNTER — Other Ambulatory Visit (HOSPITAL_COMMUNITY): Payer: Self-pay

## 2023-12-18 MED ORDER — EMPAGLIFLOZIN 10 MG PO TABS
10.0000 mg | ORAL_TABLET | Freq: Every day | ORAL | 3 refills | Status: DC
  Filled 2023-12-18 – 2023-12-26 (×2): qty 90, 90d supply, fill #0

## 2023-12-22 ENCOUNTER — Other Ambulatory Visit (HOSPITAL_COMMUNITY): Payer: Self-pay

## 2023-12-22 ENCOUNTER — Other Ambulatory Visit (HOSPITAL_BASED_OUTPATIENT_CLINIC_OR_DEPARTMENT_OTHER): Payer: Self-pay

## 2023-12-25 ENCOUNTER — Inpatient Hospital Stay (HOSPITAL_COMMUNITY)
Admission: RE | Admit: 2023-12-25 | Discharge: 2023-12-25 | Disposition: A | Discharge status: Home / Self Care | Payer: MEDICAID | Source: Ambulatory Visit

## 2023-12-26 ENCOUNTER — Other Ambulatory Visit (HOSPITAL_COMMUNITY): Payer: Self-pay | Admitting: Internal Medicine

## 2023-12-26 ENCOUNTER — Other Ambulatory Visit (HOSPITAL_COMMUNITY): Payer: Self-pay

## 2023-12-26 ENCOUNTER — Encounter (INDEPENDENT_AMBULATORY_CARE_PROVIDER_SITE_OTHER): Payer: Self-pay

## 2023-12-27 ENCOUNTER — Other Ambulatory Visit (HOSPITAL_BASED_OUTPATIENT_CLINIC_OR_DEPARTMENT_OTHER): Payer: Self-pay

## 2023-12-27 ENCOUNTER — Other Ambulatory Visit (HOSPITAL_COMMUNITY): Payer: Self-pay

## 2023-12-27 MED ORDER — HYDROCORTISONE ACETATE 25 MG RE SUPP
RECTAL | 0 refills | Status: DC
  Filled 2023-12-27: qty 28, 14d supply, fill #0

## 2023-12-27 MED ORDER — EMPAGLIFLOZIN 10 MG PO TABS
10.0000 mg | ORAL_TABLET | Freq: Every day | ORAL | 3 refills | Status: DC
  Filled 2023-12-27 – 2024-01-16 (×2): qty 90, 90d supply, fill #0

## 2023-12-27 MED ORDER — TORSEMIDE 20 MG PO TABS
40.0000 mg | ORAL_TABLET | Freq: Two times a day (BID) | ORAL | 11 refills | Status: DC
Start: 2023-10-29 — End: 2024-01-16
  Filled 2023-12-27: qty 120, 30d supply, fill #0

## 2023-12-29 ENCOUNTER — Other Ambulatory Visit (HOSPITAL_COMMUNITY): Payer: Self-pay

## 2023-12-29 ENCOUNTER — Other Ambulatory Visit: Payer: Self-pay

## 2023-12-30 ENCOUNTER — Other Ambulatory Visit: Payer: Self-pay

## 2023-12-30 ENCOUNTER — Other Ambulatory Visit (HOSPITAL_COMMUNITY): Payer: Self-pay

## 2023-12-30 MED ORDER — ENTRESTO 24-26 MG PO TABS
1.0000 | ORAL_TABLET | Freq: Two times a day (BID) | ORAL | 3 refills | Status: AC
  Filled 2023-12-30 – 2024-01-16 (×2): qty 180, 90d supply, fill #0
  Filled 2024-01-16: qty 166, 83d supply, fill #0
  Filled 2024-01-16: qty 14, 7d supply, fill #0
  Filled 2024-04-14: qty 180, 90d supply, fill #1

## 2023-12-30 MED ORDER — DIGOXIN 125 MCG PO TABS
0.1250 mg | ORAL_TABLET | Freq: Every day | ORAL | 3 refills | Status: DC
  Filled 2023-12-30: qty 30, 30d supply, fill #0

## 2023-12-31 ENCOUNTER — Encounter: Payer: Self-pay | Admitting: Pharmacist

## 2023-12-31 ENCOUNTER — Other Ambulatory Visit: Payer: Self-pay

## 2024-01-01 ENCOUNTER — Other Ambulatory Visit: Payer: Self-pay

## 2024-01-01 ENCOUNTER — Other Ambulatory Visit (HOSPITAL_COMMUNITY): Payer: Self-pay

## 2024-01-02 ENCOUNTER — Telehealth: Payer: Self-pay

## 2024-01-02 NOTE — Telephone Encounter (Signed)
 Pharmacy Patient Advocate Encounter   Received notification from CoverMyMeds that prior authorization for Symbicort  160 mg/4.5 mg is required/requested.   Insurance verification completed.   The patient is insured through [facility] .   Per test claim: PA required; PA submitted to above mentioned insurance via CoverMyMeds Key/confirmation #/EOC [id number] Status is pending

## 2024-01-05 ENCOUNTER — Other Ambulatory Visit: Payer: Self-pay

## 2024-01-05 ENCOUNTER — Other Ambulatory Visit (HOSPITAL_COMMUNITY): Payer: Self-pay

## 2024-01-06 ENCOUNTER — Other Ambulatory Visit: Payer: Self-pay

## 2024-01-15 ENCOUNTER — Telehealth (HOSPITAL_COMMUNITY): Payer: Self-pay | Admitting: Family Medicine

## 2024-01-15 NOTE — Telephone Encounter (Signed)
 Called to confirm/remind patient of their appointment at the [facility] on 01/15/24.   Appointment:   [x] Confirmed  [] Left mess   [] No answer/No voice mail  [] VM Full/unable to leave message  [] Phone not in service  Patient reminded to bring all medications and/or complete list.  Confirmed patient has transportation. Gave directions, instructed to utilize valet parking.

## 2024-01-16 ENCOUNTER — Other Ambulatory Visit (HOSPITAL_COMMUNITY): Payer: Self-pay

## 2024-01-16 ENCOUNTER — Other Ambulatory Visit (HOSPITAL_COMMUNITY): Payer: Self-pay | Admitting: Family Medicine

## 2024-01-16 ENCOUNTER — Ambulatory Visit (HOSPITAL_COMMUNITY)
Admission: RE | Admit: 2024-01-16 | Discharge: 2024-01-16 | Disposition: A | Discharge status: Home / Self Care | Payer: MEDICAID | Source: Ambulatory Visit | Attending: Family Medicine | Admitting: Family Medicine

## 2024-01-16 ENCOUNTER — Encounter (HOSPITAL_COMMUNITY): Payer: Self-pay

## 2024-01-16 ENCOUNTER — Other Ambulatory Visit: Payer: Self-pay

## 2024-01-16 VITALS — BP 106/78 | HR 102 | Wt 291.0 lb

## 2024-01-16 DIAGNOSIS — I428 Other cardiomyopathies: Secondary | ICD-10-CM | POA: Diagnosis present

## 2024-01-16 DIAGNOSIS — Z87891 Personal history of nicotine dependence: Secondary | ICD-10-CM | POA: Diagnosis not present

## 2024-01-16 DIAGNOSIS — F1511 Other stimulant abuse, in remission: Secondary | ICD-10-CM | POA: Insufficient documentation

## 2024-01-16 DIAGNOSIS — Z79899 Other long term (current) drug therapy: Secondary | ICD-10-CM | POA: Diagnosis not present

## 2024-01-16 DIAGNOSIS — J449 Chronic obstructive pulmonary disease, unspecified: Secondary | ICD-10-CM | POA: Diagnosis not present

## 2024-01-16 DIAGNOSIS — I5022 Chronic systolic (congestive) heart failure: Secondary | ICD-10-CM | POA: Insufficient documentation

## 2024-01-16 DIAGNOSIS — F1111 Opioid abuse, in remission: Secondary | ICD-10-CM | POA: Diagnosis not present

## 2024-01-16 DIAGNOSIS — I10 Essential (primary) hypertension: Secondary | ICD-10-CM | POA: Diagnosis not present

## 2024-01-16 DIAGNOSIS — F1910 Other psychoactive substance abuse, uncomplicated: Secondary | ICD-10-CM | POA: Diagnosis not present

## 2024-01-16 DIAGNOSIS — R0683 Snoring: Secondary | ICD-10-CM | POA: Diagnosis not present

## 2024-01-16 DIAGNOSIS — I447 Left bundle-branch block, unspecified: Secondary | ICD-10-CM | POA: Insufficient documentation

## 2024-01-16 DIAGNOSIS — G4733 Obstructive sleep apnea (adult) (pediatric): Secondary | ICD-10-CM | POA: Diagnosis not present

## 2024-01-16 DIAGNOSIS — F419 Anxiety disorder, unspecified: Secondary | ICD-10-CM | POA: Diagnosis not present

## 2024-01-16 DIAGNOSIS — Z7984 Long term (current) use of oral hypoglycemic drugs: Secondary | ICD-10-CM | POA: Diagnosis not present

## 2024-01-16 DIAGNOSIS — I110 Hypertensive heart disease with heart failure: Secondary | ICD-10-CM | POA: Diagnosis present

## 2024-01-16 LAB — BASIC METABOLIC PANEL WITH GFR
Anion gap: 11 (ref 5–15)
BUN: 9 mg/dL (ref 6–20)
CO2: 31 mmol/L (ref 22–32)
Calcium: 9 mg/dL (ref 8.9–10.3)
Chloride: 97 mmol/L — ABNORMAL LOW (ref 98–111)
Creatinine, Ser: 1.06 mg/dL (ref 0.61–1.24)
GFR, Estimated: >60 mL/min (ref >60)
Glucose, Bld: 155 mg/dL — ABNORMAL HIGH (ref 70–99)
Potassium: 3.6 mmol/L (ref 3.5–5.1)
Sodium: 139 mmol/L (ref 135–145)

## 2024-01-16 LAB — DIGOXIN LEVEL: Digoxin Level: 0.5 ng/mL — ABNORMAL LOW (ref 0.8–2.0)

## 2024-01-16 LAB — BRAIN NATRIURETIC PEPTIDE: B Natriuretic Peptide: 550.2 pg/mL — ABNORMAL HIGH (ref 0.0–100.0)

## 2024-01-16 MED ORDER — METOLAZONE 2.5 MG PO TABS
2.5000 mg | ORAL_TABLET | ORAL | 5 refills | Status: AC
  Filled 2024-01-16: qty 5, 35d supply, fill #0
  Filled 2024-07-07: qty 5, 35d supply, fill #1

## 2024-01-16 NOTE — Progress Notes (Signed)
 ADVANCED HF CLINIC NOTE  Primary Care: Trenton Frock, PA-C HF Cardiologist: Dr. Julane Ny  HPI: 48 y.o. Jenkins with chronic systolic CHF with EF 10% due to presumed NICM, LBBB, HTN, OSA, COPD, anxiety, prior tobacco use, and polysubstance abuse.  Diagnosed with HF years ago while admitted to a hospital in [place]  with CHF. No records from admission available. There was mention of possible LVAD placement and was discharged with LifeVest. He lost insurance and was not seen for follow-up.   He established care with Dr. Gordan Latina in 11/21. Last seen in June 2022. Admitted to psych unit in September 2022 for detox from heroin. He had episode of syncope/? Possible asystole. Had very brief CPR. He was transferred to ICU at [facility]. He was seen by Cardiology. Echo with EF 15-20%. EP considered placement of CRT-D. Defibrillator placement deferred until social situation more stable (he was homeless at the time). Did not follow-up with Cardiology after discharge.   Seen in ED at [facility] on 10/03/21 with chest tightness and dyspnea. Left before evaluated. Represented with a/c CHF. Had been out of medications 2.5 months. UDS + for amphetamines. Echo  EF < 20%, RV moderate to severely reduced, mild MR, dilated IVC. He refused cath or other HF therapies.   Cath 4/24: Normal cors. EF < 20% RA 8 PA 57/32 (42) PCW 17 Fick 5.5/2.2 PVR 4.5  Echo 1/24 EF 20-25%  RV normal   CPX 5/24 FVC 5.01 (85%)      FEV1 4.02 (87%)        FEV1/FVC 80 (101%)        BP rest: 126/70 Standing BP: 120/74 BP peak: 168/74  Peak VO2: 19.4 (70% predicted peak VO2) - when corrected for ibw 28.3 ml/kg/min VE/VCO2 slope:  37  Peak RER: 1.09   RHC 10/16/23:  elevated filling pressures, CO 4.5, and CI 1.8.    Today he returns for HF follow up. Overall feeling fine. He is SOB walking short distances on flat ground. Drinks a lot of fluids, trying to cut back. Has atypical chest pain. Bleeding after BM, he attributes this to his  hemorrhoids. Denies palpitations, dizziness, edema, or PND/Orthopnea. Chronically sleeps reclined. Appetite ok. Weight at home 289-293  pounds. Taking all medications. Denies ETOH, tobacco or drug use.   Has not used metolazone  in awhile.   Cardiac Studies  - RHC 10/16/23: RA = 12, RV = 61/15, PA = 59/43 (48), PCW = 19, Fick cardiac output/index = 4.5/1.8, PVR = 6.4, Ao sat = 95%, PA sat = 62%, 63%. PAPi = 3,5   - R/LHC 1/24, Ao = 88/Cameron  (81), LV = 102/19, RA = 8, RV = 53/13, PA = 57/32 (42), PCW = 17, Fick CO/CI = 5.5/2.2, PVR = 4.5, Ao sat = 94%, PA sat = 67%, 67% ; normal cors  - Echo (1/24): EF 20-25%, RV normal  - Echo (1/23): EF < 20%, RV moderate to severely reduced, mild MR, dilated IVC.  - Echo (11/21) [facility] EF 15-20%  Past Medical History:  Diagnosis Date   Acute on chronic systolic congestive heart failure (HCC) 05/28/2020   Formatting of this note might be different from the original. IMO update 2021   Allergy    Since grade school age   Anemia    Not sure about this   Asthma 01/1992   Chest pain 05/28/2020   Congestive heart failure (CHF) (HCC) 07/21/2020   COPD (chronic obstructive pulmonary disease) (HCC) 2014   Depression 1993  Dilated cardiomyopathy (HCC) 07/21/2020   Dyspnea on exertion 07/21/2020   Essential hypertension 05/28/2020   Generalized anxiety disorder 05/28/2020   GERD (gastroesophageal reflux disease) 1991   Heart failure (HCC)    Heart murmur Birth   LBBB (left bundle branch block) 05/28/2020   Nausea and vomiting 05/28/2020   Obstructive sleep apnea 07/21/2020   Pneumonia due to COVID-19 virus 05/28/2020   Formatting of this note might be different from the original. Diagnosed by SARS-CoV-2 PCR Cephid test at [facility] in [place], [place] on 05/28/2020.   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 (Patient taking differently: Take 2.5 mg by nebulization in the morning.) 180 mL 1   budesonide -formoterol  (SYMBICORT ) 160-4.5 MCG/ACT inhaler Inhale 2 puffs into the lungs 2 (two) times daily. (Patient taking differently: Inhale 1 puff into the lungs in the morning, at noon, and at bedtime.) 10.2 g 3   carvedilol  (COREG ) 6.25 MG tablet Take 1 tablet (6.25 mg total) by mouth 2 (two) times daily. 60 tablet 11   digoxin  (LANOXIN ) 0.125 MG tablet Take 1 tablet (0.125 mg total) by mouth daily. 90 tablet 3   empagliflozin  (JARDIANCE ) 10 MG TABS tablet Take 1 tablet (10 mg total) by mouth daily before breakfast. 90 tablet 3   hydrocortisone  (ANUCORT-HC ) 25 MG suppository Unwrap and insert 1 suppository rectally twice daily for 14 days. (Patient taking differently: Place rectally. As needed) 28 suppository 0   LORazepam  (ATIVAN ) 1 MG tablet Take 20-30 minutes before lab appointment (Patient taking differently: Take 20-30 minutes before lab appointment as needed) 1 tablet 0   mupirocin  ointment (BACTROBAN ) 2 % Apply 1 Application topically 2 (two) times daily as needed. 22 g 0   oseltamivir  (TAMIFLU ) 75 MG capsule Take 1 capsule (75 mg total) by mouth 2 (two) times daily. 10 capsule 0   potassium chloride  SA (KLOR-CON  M) 20 MEQ tablet Take 3 tablets (60 mEq total) by mouth 2 (two) times daily. Take an extra 2 tabs when you take metolazone      sacubitril -valsartan  (ENTRESTO ) 24-26 MG Take 1 tablet by mouth 2 (two) times daily. 180 tablet 3   spironolactone  (ALDACTONE ) 25 MG tablet Take 1 tablet (25 mg total) by mouth in the morning. 90 tablet 2   torsemide  (DEMADEX ) 20 MG tablet Take 2 tablets (40 mg total) by mouth 2 (two) times daily. 120 tablet 11   metolazone  (ZAROXOLYN ) 2.5 MG tablet Take 1 tablet (2.5 mg total) by mouth once a week. Take 1  tablet today and then 1 tablet 2.5 mg every Friday 40 meq every Potassium 5 tablet 5   No current facility-administered medications for this encounter.   Allergies  Allergen Reactions   Acetaminophen  Nausea Only   Social History   Socioeconomic History   Marital status: Divorced    Spouse name: Not on file   Number of children: Not on file   Years of education: Not on file   Highest education level: GED or equivalent  Occupational History   Not on file  Tobacco Use   Smoking status: Former    Current packs/day: 0.00    Average packs/day: 1 pack/day for 15.0 years (15.0  ttl pk-yrs)    Types: Cigarettes, E-cigarettes    Quit date: 07/21/2013    Years since quitting: 10.4   Smokeless tobacco: Never   Tobacco comments:    Quit when I was told I had copd  Vaping Use   Vaping status: Never Used  Substance and Sexual Activity   Alcohol use: Not Currently    Comment: Never drank more than a drink with dinner maybe 20 times   Drug use: Not Currently    Types: Cocaine, Fentanyl , Heroin, Hydrocodone, Ketamine, Marijuana, MDMA (Ecstacy), Methamphetamines, Morphine, Oxycodone , Other-see comments    Comment: These are from over 20 years ago.   Sexual activity: Not Currently    Birth control/protection: Abstinence, Condom, None  Other Topics Concern   Not on file  Social History Narrative   Not on file   Social Drivers of Health   Financial Resource Strain: Medium Risk (10/16/2023)   Overall Financial Resource Strain (CARDIA)    Difficulty of Paying Living Expenses: Somewhat hard  Food Insecurity: Food Insecurity Present (10/16/2023)   Hunger Vital Sign    Worried About Programme researcher, broadcasting/film/video in the Last Year: Often true    Ran Out of Food in the Last Year: Sometimes true  Transportation Needs: Unmet Transportation Needs (10/16/2023)   PRAPARE - Administrator, Civil Service (Medical): Yes    Lack of Transportation (Non-Medical): Yes  Physical Activity: Unknown  (10/16/2023)   Exercise Vital Sign    Days of Exercise per Week: 0 days    Minutes of Exercise per Session: Not on file  Stress: Stress Concern Present (10/16/2023)   [employer] of Occupational Health - Occupational Stress Questionnaire    Feeling of Stress : Very much  Social Connections: Socially Isolated (10/16/2023)   Social Connection and Isolation Panel [NHANES]    Frequency of Communication with Friends and Family: Once a week    Frequency of Social Gatherings with Friends and Family: Never    Attends Religious Services: Never    Database administrator or Organizations: No    Attends Engineer, structural: Not on file    Marital Status: Divorced  Catering manager Violence: Not on file   Family History  Problem Relation Age of Onset   COPD Mother    Heart disease Mother    Arthritis Mother    Depression Mother    Obesity Mother    Heart attack Mother    Heart attack Father    Stroke Father    Alcohol abuse Father    Drug abuse Father    Bone cancer Maternal Grandmother    Arthritis Maternal Grandmother    Diabetes Maternal Grandmother    Early death Maternal Grandmother    Obesity Maternal Grandmother    Varicose Veins Maternal Grandmother    Stomach cancer Maternal Grandfather    Cancer Maternal Grandfather    COPD Maternal Grandfather    ADD / ADHD Daughter    Alcohol abuse Brother    Depression Brother    Learning disabilities Brother    Obesity Brother    Asthma Sister    Depression Sister    Drug abuse Sister    Miscarriages / Stillbirths Sister    Obesity Sister    Cancer Maternal Uncle    Early death Maternal Uncle    Depression Maternal Aunt    Drug abuse Maternal Aunt    Miscarriages / Stillbirths Maternal Aunt    BP 106/78   Pulse Aaron Aas)  102   Wt 132 kg (291 lb)   SpO2 97%   BMI 38.39 kg/m   Wt Readings from Last 3 Encounters:  01/16/24 132 kg (291 lb)  10/30/23 131.5 kg (289 lb 12.8 oz)  10/30/23 131.2 kg (289 lb 4 oz)    PHYSICAL EXAM: General:  NAD. No resp difficulty HEENT: Normal Neck: Supple. Thick neck, JVP 10 Cor: Regular rate & rhythm. No rubs, gallops or murmurs. Lungs: Clear Abdomen: Soft, obese, nontender, nondistended.  Extremities: No cyanosis, clubbing, rash, 2+ BLE edema Neuro: Alert & oriented x 3, moves all 4 extremities w/o difficulty. Affect pleasant.  ReDs reading: 39 %, abnormal  ASSESSMENT & PLAN: 1. Chronic Systolic CHF, NICM  ? Etiology previous substance abuse and LBBB. He seems to be on track with his medications.  - Diagnosed February 2021 while admitted for HF in [place] . EF 10% at that time -? Related to substance abuse. EF has been severely reduced since that time.  - Cath 4/24: Normal cors. EF < 20% RA 8 PA 57/32 (42) PCW 17 Fick 5.5/2.2 PVR 4.5 - CPX 5/24 pVO2: 19.4 (70% predicted peak VO2) - corrected for ibw 28.3 ml/kg/min. VE/VCO2  37  pRER: 1.09  - RHC 10/16/23 - RA 12 PCWP 19 CO 4.5 CI 1.8.  - NYHA III. Volume up, ReDs 39%. - Add metolazone  2.5 mg + 40 KCL every Friday - Continue torsemide  40 mg bid - Continue digoxin  0.125 mg daily.  - Continue carvedilol  6.25 mg bid.  - Continue spironolactone  25 mg daily. - Continue  Entresto  24/26 mg bid. - Continue Jardiance  10 mg daily. - Needs to reschedule CRT-D - Labs today, repeat BMET in 10-14 days  2. LBBB - QRS 176 msec on ECG 10/30/23 - Needs to reschedule CRT-D, had flu and cancelled implant - Will send message to EP  3. HTN - Controlled continue current regimen.  - Meds as above   4. H/O Polysubstance abuse - Hx heroin and meth abuse.  - UDS + for amphetamines during 2023 admission - He has not used in the last couple of years.   5. Snoring - Sleep study arranged.  Follow up 3 months with Dr. Bensimhon  Gavino Fouch M Frederick Marro, FNP  01/16/24 2:48 PM

## 2024-01-16 NOTE — Patient Instructions (Addendum)
 Thank you for coming in today  If you had labs drawn today, any labs that are abnormal the clinic will call you No news is good news  Medications: Take 1 tablet of Metolazone  with Potassium 40 meq today and   then 1 tablet Metolazone  2.5 mg every Friday 40 meq every Potassium  Follow up appointments:  Your physician recommends that you schedule a follow-up appointment in:  Your physician recommends that you return for lab work in: 10-14 days BMET 3 months With Dr. Julane Ny Please call our office to schedule the follow-up appointment in June for July 2025.  I was able to get you schudeld with Dr. Daneil Dunker 01/21/2024 at 2:30 pm    Do the following things EVERYDAY: Weigh yourself in the morning before breakfast. Write it down and keep it in a log. Take your medicines as prescribed Eat low salt foods--Limit salt (sodium) to 2000 mg per day.  Stay as active as you can everyday Limit all fluids for the day to less than 2 liters   At the [facility], you and your health needs are our priority. As part of our continuing mission to provide you with exceptional heart care, we have created designated Provider Care Teams. These Care Teams include your primary Cardiologist (physician) and Advanced Practice Providers (APPs- Physician Assistants and Nurse Practitioners) who all work together to provide you with the care you need, when you need it.   You may see any of the following providers on your designated Care Team at your next follow up: Dr Jules Oar Dr Peder Bourdon Dr. Mimi Alt, NP Ruddy Corral, [place] [facility] [place], [place] Dennise Fitz, NP Luster Salters, PharmD   Please be sure to bring in all your medications bottles to every appointment.    Thank you for choosing [facility]  If you have any questions or concerns before your next appointment please send us  a message through Frederic  or call our office at [phone].    TO LEAVE A MESSAGE FOR THE NURSE SELECT OPTION 2, PLEASE LEAVE A MESSAGE INCLUDING: YOUR NAME DATE OF BIRTH CALL BACK NUMBER REASON FOR CALL**this is important as we prioritize the call backs  YOU WILL RECEIVE A CALL BACK THE SAME DAY AS LONG AS YOU CALL BEFORE 4:00 PM

## 2024-01-16 NOTE — Progress Notes (Signed)
 ReDS Vest / Clip - 01/16/24 1356       ReDS Vest / Clip   Station Marker D    Ruler Value 35    ReDS Value Range Moderate volume overload    ReDS Actual Value 39    Anatomical Comments Sitting

## 2024-01-19 ENCOUNTER — Other Ambulatory Visit (HOSPITAL_COMMUNITY): Payer: Self-pay

## 2024-01-19 ENCOUNTER — Other Ambulatory Visit: Payer: Self-pay

## 2024-01-19 ENCOUNTER — Other Ambulatory Visit (HOSPITAL_COMMUNITY): Payer: Self-pay | Admitting: Internal Medicine

## 2024-01-19 ENCOUNTER — Encounter: Payer: Self-pay | Admitting: Pharmacist

## 2024-01-20 ENCOUNTER — Other Ambulatory Visit: Payer: Self-pay

## 2024-01-20 ENCOUNTER — Other Ambulatory Visit (HOSPITAL_COMMUNITY): Payer: Self-pay

## 2024-01-20 MED ORDER — DIGOXIN 125 MCG PO TABS
0.1250 mg | ORAL_TABLET | Freq: Every day | ORAL | 3 refills | Status: DC
  Filled 2024-01-20: qty 30, 30d supply, fill #0
  Filled 2024-03-22: qty 30, 30d supply, fill #1
  Filled 2024-04-14: qty 30, 30d supply, fill #2

## 2024-01-21 ENCOUNTER — Encounter (HOSPITAL_COMMUNITY): Payer: MEDICAID

## 2024-01-21 ENCOUNTER — Ambulatory Visit: Payer: MEDICAID | Attending: Cardiology | Admitting: Cardiology

## 2024-01-21 ENCOUNTER — Encounter: Payer: Self-pay | Admitting: Cardiology

## 2024-01-21 ENCOUNTER — Other Ambulatory Visit (HOSPITAL_COMMUNITY): Payer: Self-pay

## 2024-01-21 VITALS — BP 122/75 | HR 95 | Ht 73.0 in | Wt 292.5 lb

## 2024-01-21 DIAGNOSIS — I5022 Chronic systolic (congestive) heart failure: Secondary | ICD-10-CM | POA: Insufficient documentation

## 2024-01-21 DIAGNOSIS — I447 Left bundle-branch block, unspecified: Secondary | ICD-10-CM | POA: Diagnosis present

## 2024-01-21 DIAGNOSIS — I428 Other cardiomyopathies: Secondary | ICD-10-CM | POA: Insufficient documentation

## 2024-01-21 MED ORDER — POTASSIUM CHLORIDE CRYS ER 20 MEQ PO TBCR
60.0000 meq | EXTENDED_RELEASE_TABLET | Freq: Two times a day (BID) | ORAL | Status: DC
Start: 2024-01-21 — End: 2024-04-30

## 2024-01-21 NOTE — Progress Notes (Signed)
 " Electrophysiology Office Note:   Date:  01/22/2024  ID:  Cameron Jenkins, DOB 04-02-1976, MRN [id number]  Primary Cardiologist: None Electrophysiologist: Fonda Kitty, MD      History of Present Illness:   Cameron Jenkins is a 48 y.o. male with h/o chronic systolic heart failure secondary due to presumed NICM, LBBB, HTN, OSA, hx COPD, anxiety, prior tobacco use and polysubstance abuse who is being seen today for evaluation for CRT-D implant.  He has a long-standing history of heart failure, previously followed in [place] . CRT-D was considered in 2022 but at the time patient was homeless and device placement was deferred. Relocated to [place] and is now in a much better social situation. Established care with Dr. Bensimhon. Referred to me for CRT-D implant.   Discussed the use of AI scribe software for clinical note transcription with the patient, who gave verbal consent to proceed.  History of Present Illness He was scheduled for CRT-D implant on 11/07/23 but developed acute influenza infection the week prior. It took him a long time to recover. He has been experiencing persistent fatigue and shortness of breath. Despite significant improvement, he has not fully recovered. He notes increased shortness of breath and fluid retention, leading to weight gain and feelings of discouragement. He has been managing these symptoms with medication adjustments, including the addition of metolazone  and a switch to a different formulation of torsemide , which has significantly helped manage his fluid retention. He takes his medications at 6 AM and 6 PM to minimize sleep disruption, as he wakes up at 5 AM.He mentions having no help at home, which is a concern for him in terms of recovery from any procedures. No current fever or active infection.   Review of systems complete and found to be negative unless listed in HPI.   EP Information / Studies Reviewed:    EKG is not ordered today. EKG from 09/22/23 reviewed which  showed sinus rhythm with IVCD, LBBB like pattern, QRS .      Cardiac MRI 01/22/13: IMPRESSION: 1. Severe LV dilatation, normal wall thickness, and severe systolic dysfunction (EF 15%)   2.  Moderate RV dilatation with severe systolic dysfunction (EF 18%)   3. RV insertion site LGE, which is a nonspecific scar pattern often seen in setting of elevated pulmonary pressures   4.  Mild mitral regurgitation (regurgitant fraction 15%)  RHC/LHC 12/27/22: Ao = 88/76  (81) LV = 102/19 RA = 8 RV = 53/13 PA = 57/32 (42) PCW = 17 Fick cardiac output/index = 5.5/2.2 PVR = 4.5 Ao sat = 94% PA sat = 67%, 67%   Assessment:   1. Normal coronary arteries 2. Severe NICM EF < 20% 3. 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 Left ventricular ejection fraction, by estimation, is 20 to 25%. The  left ventricle has severely decreased function. The left ventricle  demonstrates global hypokinesis. There is mild concentric left ventricular  hypertrophy. Left ventricular diastolic   parameters are indeterminate.   2. Right ventricular systolic function is normal. The right ventricular  size is normal.   3. The mitral valve is normal in structure. Mild mitral valve  regurgitation. No evidence of mitral stenosis.   4. The aortic valve is normal in structure. Aortic valve regurgitation is  not visualized. No aortic stenosis is present.   5. The inferior vena cava is normal in size with greater than 50%  respiratory variability, suggesting right atrial pressure of 3 mmHg.  Physical Exam:   VS:  BP 122/75   Pulse 95   Ht 6' 1 (1.854 m)   Wt 292 lb 8 oz (132.7 kg)   SpO2 97%   BMI 38.59 kg/m    Wt Readings from Last 3 Encounters:  01/21/24 292 lb 8 oz (132.7 kg)  01/16/24 291 lb (132 kg)  10/30/23 289 lb 12.8 oz (131.5 kg)     GEN: Well nourished, well developed in no acute distress NECK: No JVD CARDIAC: Normal rate, regular  rhythm RESPIRATORY:  Clear to auscultation without rales, wheezing or rhonchi  ABDOMEN: Soft,  non-distended EXTREMITIES:  Trace edema  ASSESSMENT AND PLAN:   Cameron Jenkins is a 48 y.o. male with h/o chronic systolic heart failure secondary due to presumed NICM, LBBB, HTN, OSA, hx COPD, anxiety, prior tobacco use and polysubstance abuse who is being seen today for evaluation for CRT-D implant.  #. Chronic systolic heart failure: NYHA class III. Persistent LV dysfunction despite medications. #. NICM #. LBBB/IVCD: IVCD with LBBB like pattern, QRS up to .  - Continue GDMT regimen of carvedilol  6.25mg  BID, empagliflozin  10mg  dialy, Entresto  24-26mg  BID, spironolactone  25mg  daily.  - Continue close follow up with our HF team, Dr. Cherrie. - Patient meets criteria for primary prevention defibrillator based on EF <35% despite optimal medical therapy and CRT for LBBB like pattern and QRS duration of in setting of NYHA class III HF. I have had a thorough discussion with the patient reviewing options.  The patient and have had opportunities to ask questions and have them answered. Risks, benefits, alternatives to ICD implantation were discussed in detail with the patient today. The patient  understands that the risks include but are not limited to bleeding, infection, pneumothorax, perforation, tamponade, vascular damage, renal failure, MI, stroke, death, inappropriate shocks, and lead dislodgement and wishes to proceed.  We will therefore schedule device implantation at the next available time.   Signed, Fonda Kitty, MD  "

## 2024-01-21 NOTE — Patient Instructions (Signed)
 Medication Instructions:  Your physician recommends that you continue on your current medications as directed. Please refer to the Current Medication list given to you today.  *If you need a refill on your cardiac medications before your next appointment, please call your pharmacy*   Lab Work: None ordered If you have labs (blood work) drawn today and your tests are completely normal, you will receive your results only by: MyChart Message (if you have MyChart) OR A paper copy in the mail If you have any lab test that is abnormal or we need to change your treatment, we will call you to review the results.   Testing/Procedures: CRT or cardiac resynchronization therapy is a treatment used to correct heart failure. When you have heart failure your heart is weakened and doesn't pump as well as it should. This therapy may help reduce symptoms and improve the quality of life.  Please see the handout/brochure given to you today to get more information of the different options of therapy.  You will scheduled for 7/9, we will contact you  to review instructions.   Follow-Up: At [facility], you and your health needs are our priority.  As part of our continuing mission to provide you with exceptional heart care, we have created designated Provider Care Teams.  These Care Teams include your primary Cardiologist (physician) and Advanced Practice Providers (APPs -  Physician Assistants and Nurse Practitioners) who all work together to provide you with the care you need, when you need it.  Your next appointment:   2 week(s) after your defibrillator implant  The format for your next appointment:   In Person  Provider:   Device clinic for a wound check  {   Thank you for choosing [facility]!!   Reece Cane, RN [phone]  Other Instructions

## 2024-01-22 NOTE — Research (Cosign Needed Addendum)
 Patient came in for observation day 1 control visit  Labs from screening came back and he was a screen fail due to GFR was to high per protocol.

## 2024-01-27 ENCOUNTER — Ambulatory Visit: Payer: MEDICAID | Admitting: Gastroenterology

## 2024-01-27 ENCOUNTER — Other Ambulatory Visit (HOSPITAL_COMMUNITY): Payer: MEDICAID

## 2024-02-02 NOTE — Telephone Encounter (Signed)
 Pharmacy Patient Advocate Encounter  Received notification from [facility] that Prior Authorization for Budesonide -Formoterol  Fumarate 160-4.5MCG/ACT aerosol has been DENIED.  No reason given; No denial letter received via Fax or CMM. It has been requested and will be uploaded to the media tab once received.   PA #/Case ID/Reference #: [id number]

## 2024-02-03 NOTE — Telephone Encounter (Signed)
 Called patient. No issues with Symbicort . He just filled last week. He states he was denied the generic but filling brand name fine. No issues.   Heidi Llamas - FYI.

## 2024-02-05 ENCOUNTER — Telehealth: Payer: Self-pay

## 2024-02-05 DIAGNOSIS — I5022 Chronic systolic (congestive) heart failure: Secondary | ICD-10-CM

## 2024-02-05 NOTE — Telephone Encounter (Signed)
 Patient is scheduled for CRT-D implant with Dr. Calvin Caulk on 03/24/24 @ 1pm.  Patient is scheduled to have labs drawn on 03/10/24.  Labs have been ordered and released.   Procedure instructions have been sent via MyChart per patient request.   Pre-cert message sent on 5/8.

## 2024-02-10 ENCOUNTER — Ambulatory Visit: Payer: MEDICAID | Admitting: Physician Assistant

## 2024-02-13 ENCOUNTER — Other Ambulatory Visit: Payer: Self-pay

## 2024-02-13 ENCOUNTER — Encounter: Payer: Self-pay | Admitting: Pharmacist

## 2024-02-13 ENCOUNTER — Other Ambulatory Visit (HOSPITAL_COMMUNITY): Payer: Self-pay

## 2024-03-15 ENCOUNTER — Telehealth (HOSPITAL_COMMUNITY): Payer: Self-pay

## 2024-03-15 NOTE — Telephone Encounter (Signed)
 Spoke with patient to discuss upcoming procedure.   Confirmed patient is scheduled for a Biventricular implantable cardioverter defibrillator on Wednesday, July 9 with Dr. Sidra Kitty. Instructed patient to arrive at the Main Entrance A at [facility]: [address] [place], [place] [address] and check in at Admitting at 1:30 PM.   Labs to be completed by July 3.   Any recent signs of acute illness or been started on antibiotics?  No Any new medications started? No Any medications to hold? Hold Jardiance  for 3 days prior to the procedure- last dose on July 05. Hold Spironolactone , Metolazone  and Torsemide  the morning of procedure.  Medication instructions:  On the morning of your procedure you may take all other medications not discussed with a sip of water.  No eating or drinking after midnight prior to procedure. Patient states he will not be able to follow this restriction. Message sent to Dr. Kitty asking if he can have a light breakfast before 7 AM.   The night before your procedure and the morning of your procedure, wash thoroughly with the CHG surgical soap from the neck down, paying special attention to the area where your procedure will be performed.  Advised of plan to go home the same day and will only stay overnight if medically necessary. You MUST have a responsible adult to drive you home and MUST be with you the first 24 hours after you arrive home.  Patient verbalized understanding to all instructions provided and agreed to proceed with procedure.

## 2024-03-16 NOTE — Telephone Encounter (Signed)
 Advised patient per Dr. Kennyth, it is fine for him to have a LIGHT breakfast before 7AM. Nothing to eat or drink after 7AM. Patient voiced understanding and appreciation.

## 2024-03-17 ENCOUNTER — Encounter: Payer: Self-pay | Admitting: Emergency Medicine

## 2024-03-21 ENCOUNTER — Ambulatory Visit (HOSPITAL_BASED_OUTPATIENT_CLINIC_OR_DEPARTMENT_OTHER): Payer: MEDICAID | Attending: Internal Medicine | Admitting: Cardiology

## 2024-03-21 VITALS — Ht 73.0 in | Wt 290.0 lb

## 2024-03-21 DIAGNOSIS — G4733 Obstructive sleep apnea (adult) (pediatric): Secondary | ICD-10-CM | POA: Diagnosis not present

## 2024-03-21 DIAGNOSIS — I5022 Chronic systolic (congestive) heart failure: Secondary | ICD-10-CM | POA: Diagnosis present

## 2024-03-21 DIAGNOSIS — G4731 Primary central sleep apnea: Secondary | ICD-10-CM | POA: Diagnosis not present

## 2024-03-21 DIAGNOSIS — G4736 Sleep related hypoventilation in conditions classified elsewhere: Secondary | ICD-10-CM | POA: Insufficient documentation

## 2024-03-22 ENCOUNTER — Other Ambulatory Visit (HOSPITAL_COMMUNITY): Payer: Self-pay

## 2024-03-22 NOTE — Telephone Encounter (Signed)
 Called patient to follow up on blood work needed for upcoming procedure. Patient stated that he didn't have a ride on July 3, but is on his way to [facility] now, to have lab work completed.

## 2024-03-23 NOTE — Telephone Encounter (Addendum)
 Reviewed chart- As of right now, patient has not had his lab work completed even after multiple conversations, including yesterday. Spoke with a [facility] rep who stated no record of patient having labs drawn on yesterday.   Attempted to reach patient, no answer. Left VM for patient to return call.   I will place an order to have labs drawn at hospital tomorrow, just in case. Dr. Kennyth made aware.

## 2024-03-23 NOTE — Telephone Encounter (Signed)
 Received a call from patient informing that he just left [facility] getting lab work completed. Reviewed instructions for medications, surgical scrub, NPO status and location/arrival time of 1:30pm. Patient voiced understanding.

## 2024-03-23 NOTE — Pre-Procedure Instructions (Addendum)
Attempted to call patient regarding procedure instructions.  Left voicemail on the following items: Arrival time 1:30 Nothing to eat or drink after midnight No meds AM of procedure Responsible person to drive you home and stay with you for 24 hrs Wash with special soap night before and morning of procedure  

## 2024-03-24 ENCOUNTER — Ambulatory Visit (HOSPITAL_COMMUNITY)
Admission: RE | Admit: 2024-03-24 | Discharge: 2024-03-25 | Disposition: A | Discharge status: Home / Self Care | Payer: MEDICAID | Source: Ambulatory Visit | Attending: Cardiology | Admitting: Cardiology

## 2024-03-24 ENCOUNTER — Encounter (HOSPITAL_COMMUNITY): Payer: Self-pay | Admitting: Cardiology

## 2024-03-24 ENCOUNTER — Encounter (HOSPITAL_COMMUNITY): Payer: Self-pay | Admitting: Anesthesiology

## 2024-03-24 ENCOUNTER — Encounter (HOSPITAL_COMMUNITY)
Admission: RE | Disposition: A | Discharge status: Home / Self Care | Payer: MEDICAID | Source: Ambulatory Visit | Attending: Cardiology

## 2024-03-24 ENCOUNTER — Other Ambulatory Visit: Payer: Self-pay

## 2024-03-24 DIAGNOSIS — I110 Hypertensive heart disease with heart failure: Secondary | ICD-10-CM | POA: Diagnosis not present

## 2024-03-24 DIAGNOSIS — I428 Other cardiomyopathies: Secondary | ICD-10-CM | POA: Insufficient documentation

## 2024-03-24 DIAGNOSIS — F419 Anxiety disorder, unspecified: Secondary | ICD-10-CM | POA: Insufficient documentation

## 2024-03-24 DIAGNOSIS — Z87891 Personal history of nicotine dependence: Secondary | ICD-10-CM | POA: Insufficient documentation

## 2024-03-24 DIAGNOSIS — Z01812 Encounter for preprocedural laboratory examination: Secondary | ICD-10-CM

## 2024-03-24 DIAGNOSIS — G4733 Obstructive sleep apnea (adult) (pediatric): Secondary | ICD-10-CM | POA: Insufficient documentation

## 2024-03-24 DIAGNOSIS — Z79899 Other long term (current) drug therapy: Secondary | ICD-10-CM | POA: Insufficient documentation

## 2024-03-24 DIAGNOSIS — I5022 Chronic systolic (congestive) heart failure: Secondary | ICD-10-CM

## 2024-03-24 DIAGNOSIS — J449 Chronic obstructive pulmonary disease, unspecified: Secondary | ICD-10-CM | POA: Diagnosis not present

## 2024-03-24 DIAGNOSIS — I447 Left bundle-branch block, unspecified: Secondary | ICD-10-CM | POA: Diagnosis not present

## 2024-03-24 HISTORY — PX: BIV ICD INSERTION CRT-D: EP1195

## 2024-03-24 LAB — BASIC METABOLIC PANEL WITH GFR
BUN/Creatinine Ratio: 9 (ref 9–20)
BUN: 11 mg/dL (ref 6–24)
CO2: 25 mmol/L (ref 20–29)
Calcium: 9.4 mg/dL (ref 8.7–10.2)
Chloride: 97 mmol/L (ref 96–106)
Creatinine, Ser: 1.23 mg/dL (ref 0.76–1.27)
Glucose: 193 mg/dL — ABNORMAL HIGH (ref 70–99)
Potassium: 4.1 mmol/L (ref 3.5–5.2)
Sodium: 140 mmol/L (ref 134–144)
eGFR: 73 mL/min/1.73 (ref >59)

## 2024-03-24 LAB — CBC
Hematocrit: 52 % — ABNORMAL HIGH (ref 37.5–51.0)
Hemoglobin: 17.2 g/dL (ref 13.0–17.7)
MCH: 30.2 pg (ref 26.6–33.0)
MCHC: 33.1 g/dL (ref 31.5–35.7)
MCV: 91 fL (ref 79–97)
Platelets: 260 x10E3/uL (ref 150–450)
RBC: 5.69 x10E6/uL (ref 4.14–5.80)
RDW: 13.2 % (ref 11.6–15.4)
WBC: 7.7 x10E3/uL (ref 3.4–10.8)

## 2024-03-24 SURGERY — BIV ICD INSERTION CRT-D
Anesthesia: General

## 2024-03-24 MED ORDER — ALBUTEROL SULFATE (2.5 MG/3ML) 0.083% IN NEBU: 2.5000 mg | INHALATION_SOLUTION | Freq: Four times a day (QID) | RESPIRATORY_TRACT | Status: DC | PRN

## 2024-03-24 MED ORDER — SPIRONOLACTONE 25 MG PO TABS
25.0000 mg | ORAL_TABLET | Freq: Every day | ORAL | Status: DC
  Administered 2024-03-25: 25 mg via ORAL
  Filled 2024-03-24: qty 1

## 2024-03-24 MED ORDER — FENTANYL CITRATE (PF) 100 MCG/2ML IJ SOLN
INTRAMUSCULAR | Status: AC
  Filled 2024-03-24: qty 2

## 2024-03-24 MED ORDER — CARVEDILOL 6.25 MG PO TABS
6.2500 mg | ORAL_TABLET | Freq: Two times a day (BID) | ORAL | Status: DC
  Administered 2024-03-24 – 2024-03-25 (×2): 6.25 mg via ORAL
  Filled 2024-03-24 (×2): qty 1

## 2024-03-24 MED ORDER — MIDAZOLAM HCL 5 MG/5ML IJ SOLN
INTRAMUSCULAR | Status: DC | PRN
  Administered 2024-03-24 (×2): .5 mg via INTRAVENOUS
  Administered 2024-03-24: 1 mg via INTRAVENOUS
  Administered 2024-03-24 (×6): .5 mg via INTRAVENOUS

## 2024-03-24 MED ORDER — DIGOXIN 125 MCG PO TABS
0.1250 mg | ORAL_TABLET | Freq: Every day | ORAL | Status: DC
  Administered 2024-03-25: 0.125 mg via ORAL
  Filled 2024-03-24: qty 1

## 2024-03-24 MED ORDER — SACUBITRIL-VALSARTAN 24-26 MG PO TABS
1.0000 | ORAL_TABLET | Freq: Two times a day (BID) | ORAL | Status: DC
  Administered 2024-03-25: 1 via ORAL
  Filled 2024-03-24: qty 1

## 2024-03-24 MED ORDER — FENTANYL CITRATE (PF) 100 MCG/2ML IJ SOLN
INTRAMUSCULAR | Status: DC | PRN
  Administered 2024-03-24 (×7): 25 ug via INTRAVENOUS
  Administered 2024-03-24: 50 ug via INTRAVENOUS
  Administered 2024-03-24: 25 ug via INTRAVENOUS

## 2024-03-24 MED ORDER — MIDAZOLAM HCL 2 MG/2ML IJ SOLN
INTRAMUSCULAR | Status: AC
  Filled 2024-03-24: qty 2

## 2024-03-24 MED ORDER — LORAZEPAM 1 MG PO TABS: 1.0000 mg | ORAL_TABLET | Freq: Once | ORAL | Status: DC

## 2024-03-24 MED ORDER — CEFAZOLIN SODIUM-DEXTROSE 1-4 GM/50ML-% IV SOLN
INTRAVENOUS | Status: AC
  Filled 2024-03-24: qty 50

## 2024-03-24 MED ORDER — POVIDONE-IODINE 10 % EX SWAB
2.0000 | Freq: Once | CUTANEOUS | Status: AC
  Administered 2024-03-24: 2 via TOPICAL

## 2024-03-24 MED ORDER — FLUTICASONE FUROATE-VILANTEROL 200-25 MCG/ACT IN AEPB
1.0000 | INHALATION_SPRAY | Freq: Every day | RESPIRATORY_TRACT | Status: DC
  Administered 2024-03-25: 1 via RESPIRATORY_TRACT
  Filled 2024-03-24: qty 28

## 2024-03-24 MED ORDER — ONDANSETRON HCL 4 MG/2ML IJ SOLN
4.0000 mg | Freq: Four times a day (QID) | INTRAMUSCULAR | Status: DC | PRN
Start: 2024-03-24 — End: 2024-03-25

## 2024-03-24 MED ORDER — EMPAGLIFLOZIN 10 MG PO TABS
10.0000 mg | ORAL_TABLET | Freq: Every day | ORAL | Status: DC
Start: 2024-03-25 — End: 2024-03-25
  Administered 2024-03-25: 10 mg via ORAL
  Filled 2024-03-24: qty 1

## 2024-03-24 MED ORDER — LIDOCAINE HCL (PF) 1 % IJ SOLN
INTRAMUSCULAR | Status: DC | PRN
  Administered 2024-03-24: 60 mL

## 2024-03-24 MED ORDER — CHLORHEXIDINE GLUCONATE 4 % EX SOLN
4.0000 | Freq: Once | CUTANEOUS | Status: AC
  Administered 2024-03-24: 4 via TOPICAL
  Filled 2024-03-24: qty 60

## 2024-03-24 MED ORDER — HEPARIN (PORCINE) IN NACL 1000-0.9 UT/500ML-% IV SOLN
INTRAVENOUS | Status: DC | PRN
  Administered 2024-03-24: 500 mL

## 2024-03-24 MED ORDER — CEFAZOLIN SODIUM-DEXTROSE 3-4 GM/150ML-% IV SOLN
3.0000 g | INTRAVENOUS | Status: AC
  Administered 2024-03-24: 3 g via INTRAVENOUS
  Filled 2024-03-24: qty 150

## 2024-03-24 MED ORDER — POTASSIUM CHLORIDE CRYS ER 20 MEQ PO TBCR
40.0000 meq | EXTENDED_RELEASE_TABLET | Freq: Two times a day (BID) | ORAL | Status: DC
  Administered 2024-03-25: 40 meq via ORAL
  Filled 2024-03-24: qty 2

## 2024-03-24 MED ORDER — SODIUM CHLORIDE 0.9 % IV SOLN
INTRAVENOUS | Status: AC
  Administered 2024-03-24: 80 mg
  Filled 2024-03-24: qty 2

## 2024-03-24 MED ORDER — CEFAZOLIN SODIUM-DEXTROSE 2-4 GM/100ML-% IV SOLN
INTRAVENOUS | Status: AC
  Filled 2024-03-24: qty 100

## 2024-03-24 MED ORDER — LIDOCAINE HCL (PF) 1 % IJ SOLN
INTRAMUSCULAR | Status: AC
Start: 2024-03-24 — End: 2024-03-24
  Filled 2024-03-24: qty 60

## 2024-03-24 MED ORDER — SODIUM CHLORIDE 0.9 % IV SOLN: 80.0000 mg | INTRAVENOUS | Status: AC

## 2024-03-24 MED ORDER — TORSEMIDE 20 MG PO TABS
40.0000 mg | ORAL_TABLET | Freq: Two times a day (BID) | ORAL | Status: DC
Start: 2024-03-25 — End: 2024-03-25
  Administered 2024-03-25: 40 mg via ORAL
  Filled 2024-03-24: qty 2

## 2024-03-24 MED ORDER — TRAMADOL HCL 50 MG PO TABS
50.0000 mg | ORAL_TABLET | Freq: Two times a day (BID) | ORAL | Status: DC | PRN
  Administered 2024-03-24: 50 mg via ORAL
  Filled 2024-03-24: qty 1

## 2024-03-24 MED ORDER — SODIUM CHLORIDE 0.9 % IV SOLN: INTRAVENOUS | Status: DC

## 2024-03-24 SURGICAL SUPPLY — 24 items
ADAPTER SEALING SSSA-09 (ADAPTER) IMPLANT
CABLE SURGICAL S-101-97-12 (CABLE) ×1 IMPLANT
CATH ATTAIN COM SURV 6250V-EH (CATHETERS) IMPLANT
CATH ATTAIN SEL SURV 6248V-130 (CATHETERS) IMPLANT
CATH ATTAIN SEL SURV 6248V-90 (CATHETERS) IMPLANT
CATH CPS LOCATOR 3D MED XLNG (CATHETERS) IMPLANT
CATH HIS SELECTSITE C304HIS (CATHETERS) IMPLANT
ICD COBALT XT CRT DTPA2D4 (ICD Generator) IMPLANT
KIT ESSENTIALS PG (KITS) IMPLANT
KIT MICROPUNCTURE NIT STIFF (SHEATH) IMPLANT
LEAD CAPSURE NOVUS 5076-58CM (Lead) IMPLANT
LEAD CAPSUREFIX NOVUS 5076-65 (Lead) IMPLANT
LEAD SELECT SECURE 3830 383069 (Lead) IMPLANT
LEAD SPRINT QUAT SEC 6935M-62 (Lead) IMPLANT
PAD DEFIB RADIO PHYSIO CONN (PAD) ×1 IMPLANT
POUCH AIGIS-R ANTIBACT ICD LRG (Mesh General) IMPLANT
SHEATH 7FR PRELUDE SNAP 13 (SHEATH) IMPLANT
SHEATH 9.5FR PRELUDE SNAP 13 (SHEATH) IMPLANT
SHEATH 9FR PRELUDE SNAP 13 (SHEATH) IMPLANT
SHEATH PROBE COVER 6X72 (BAG) IMPLANT
SLITTER AGILIS HISPRO (INSTRUMENTS) IMPLANT
TRAY PACEMAKER INSERTION (PACKS) ×1 IMPLANT
WIRE ACUITY WHISPER EDS 4648 (WIRE) IMPLANT
WIRE HI TORQ VERSACORE-J 145CM (WIRE) IMPLANT

## 2024-03-24 NOTE — H&P (Signed)
 Electrophysiology Note:   Date:  03/24/24  ID:  Cameron Jenkins, DOB [birth year], MRN [id number]   Primary Cardiologist: None Electrophysiologist: Cameron Kitty, MD       History of Present Illness:   Cameron Jenkins is a 48 y.o. male with h/o chronic systolic heart failure secondary due to presumed NICM, LBBB, HTN, OSA, hx COPD, anxiety, prior tobacco use and polysubstance abuse who is being seen today for evaluation for CRT-D implant.   He has a long-standing history of heart failure, previously followed in [place] . CRT-D was considered in 2022 but at the time patient was homeless and device placement was deferred. Relocated to [place] and is now in a much better social situation. Established care with Dr. Bensimhon. Referred to me for CRT-D implant.    Discussed the use of AI scribe software for clinical note transcription with the patient, who gave verbal consent to proceed.   History of Present Illness He was scheduled for CRT-D implant on 11/07/23 but developed acute influenza infection the week prior. It took him a long time to recover. He has been experiencing persistent fatigue and shortness of breath. Despite significant improvement, he has not fully recovered. He notes increased shortness of breath and fluid retention, leading to weight gain and feelings of discouragement. He has been managing these symptoms with medication adjustments, including the addition of metolazone  and a switch to a different formulation of torsemide , which has significantly helped manage his fluid retention. He takes his medications at 6 AM and 6 PM to minimize sleep disruption, as he wakes up at 5 AM.He mentions having no help at home, which is a concern for him in terms of recovery from any procedures. No current fever or active infection.   Interval: Patient presents today for scheduled CRT-D implant. Reports feeling relatively well. He is anxious about procedure today. Otherwise no new or acute complaints.     Review of systems complete and found to be negative unless listed in HPI.    EP Information / Studies Reviewed:     EKG is not ordered today. EKG from 09/22/23 reviewed which showed sinus rhythm with IVCD, LBBB like pattern, QRS .    Cardiac MRI 01/22/13: IMPRESSION: 1. Severe LV dilatation, normal wall thickness, and severe systolic dysfunction (EF 15%)   2.  Moderate RV dilatation with severe systolic dysfunction (EF 18%)   3. RV insertion site LGE, which is a nonspecific scar pattern often seen in setting of elevated pulmonary pressures   4.  Mild mitral regurgitation (regurgitant fraction 15%)   RHC/LHC 12/27/22: Ao = 88/76  (81) LV = 102/19 RA = 8 RV = 53/13 PA = 57/32 (42) PCW = 17 Fick cardiac output/index = 5.5/2.2 PVR = 4.5 Ao sat = 94% PA sat = 67%, 67%   Assessment:   1. Normal coronary arteries 2. Severe NICM EF < 20% 3. 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 Left ventricular ejection fraction, by estimation, is 20 to 25%. The  left ventricle has severely decreased function. The left ventricle  demonstrates global hypokinesis. There is mild concentric left ventricular  hypertrophy. Left ventricular diastolic   parameters are indeterminate.   2. Right ventricular systolic function is normal. The right ventricular  size is normal.   3. The mitral valve is normal in structure. Mild mitral valve  regurgitation. No evidence of mitral stenosis.   4. The aortic valve is normal in structure. Aortic valve regurgitation is  not visualized. No aortic stenosis is present.   5. The inferior vena cava is normal in size with greater than 50%  respiratory variability, suggesting right atrial pressure of 3 mmHg.    Physical Exam:    Today's Vitals   03/24/24 1319 03/24/24 1333  BP:  (!) 124/90  Pulse:  (!) 102  Resp:  18  Temp:  97.7 F (36.5 C)  TempSrc:  Oral  SpO2:  95%  Weight:  132 kg  Height:  6' 1  (1.854 m)  PainSc: 0-No pain    Body mass index is 38.39 kg/m.  GEN: Well nourished, well developed in no acute distress NECK: No JVD CARDIAC: Normal rate, regular rhythm RESPIRATORY:  Clear to auscultation without rales, wheezing or rhonchi  ABDOMEN: Soft,  non-distended EXTREMITIES:  Trace edema   ASSESSMENT AND PLAN:   Melvern Jenkins is a 48 y.o. male with h/o chronic systolic heart failure secondary due to presumed NICM, LBBB, HTN, OSA, hx COPD, anxiety, prior tobacco use and polysubstance abuse who is being seen today for evaluation for CRT-D implant.   #. Chronic systolic heart failure: NYHA class III. Persistent LV dysfunction despite medications. #. NICM #. LBBB/IVCD: IVCD with LBBB like pattern, QRS up to .  - Continue GDMT regimen of carvedilol  6.25mg  BID, empagliflozin  10mg  dialy, Entresto  24-26mg  BID, spironolactone  25mg  daily.  - Continue close follow up with our HF team, Dr. Cherrie. - Patient meets criteria for primary prevention defibrillator based on EF <35% despite optimal medical therapy and CRT for LBBB like pattern and QRS duration of in setting of NYHA class III HF. I have had a thorough discussion with the patient reviewing options.  The patient and have had opportunities to ask questions and have them answered. Risks, benefits, alternatives to ICD implantation were discussed in detail with the patient today. The patient  understands that the risks include but are not limited to bleeding, infection, pneumothorax, perforation, tamponade, vascular damage, renal failure, MI, stroke, death, inappropriate shocks, and lead dislodgement and wishes to proceed with implant today.    Signed, Cameron Kitty, MD

## 2024-03-25 ENCOUNTER — Encounter (HOSPITAL_COMMUNITY): Payer: Self-pay | Admitting: Cardiology

## 2024-03-25 ENCOUNTER — Ambulatory Visit (HOSPITAL_COMMUNITY): Payer: MEDICAID

## 2024-03-25 DIAGNOSIS — I5022 Chronic systolic (congestive) heart failure: Secondary | ICD-10-CM | POA: Diagnosis not present

## 2024-03-25 DIAGNOSIS — I428 Other cardiomyopathies: Secondary | ICD-10-CM | POA: Diagnosis not present

## 2024-03-25 DIAGNOSIS — I110 Hypertensive heart disease with heart failure: Secondary | ICD-10-CM | POA: Diagnosis not present

## 2024-03-25 DIAGNOSIS — I447 Left bundle-branch block, unspecified: Secondary | ICD-10-CM | POA: Diagnosis not present

## 2024-03-25 NOTE — TOC CM/SW Note (Signed)
 Transition of Care [facility]) - Inpatient Brief Assessment   Patient Details  Name: Cameron Jenkins MRN: [id number] Date of Birth: 04-Sep-1976  Transition of Care [facility]) CM/SW Contact:    Waddell Barnie Rama, RN Phone Number: 03/25/2024, 9:59 AM   Clinical Narrative: From home alone, has PCP and insurance on file, states has no HH services in place at this time or DME at home.  States friend, Cameron Jenkins will transport them home at dc and he has no family  support system, states gets medications from [facility] by delivery.  Pta self ambulatory.    Transition of Care Asessment: Insurance and Status: Insurance coverage has been reviewed Patient has primary care physician: Yes Home environment has been reviewed: home alone Prior level of function:: indep Prior/Current Home Services: No current home services Social Drivers of Health Review: SDOH reviewed no interventions necessary Readmission risk has been reviewed: Yes Transition of care needs: no transition of care needs at this time

## 2024-03-25 NOTE — TOC Transition Note (Signed)
 Transition of Care [facility]) - Discharge Note   Patient Details  Name: Cameron Jenkins MRN: [id number] Date of Birth: Apr 27, 1976  [facility]) CM/SW Contact:  Waddell Barnie Rama, RN Phone Number: 03/25/2024, 10:00 AM   Clinical Narrative:    For dc today, has no needs.         Patient Goals and CMS Choice            Discharge Placement                       Discharge Plan and Services Additional resources added to the After Visit Summary for                                       Social Drivers of Health (SDOH) Interventions SDOH Screenings   Food Insecurity: Food Insecurity Present (03/24/2024)  Housing: High Risk (03/24/2024)  Transportation Needs: Unmet Transportation Needs (03/24/2024)  Utilities: Not At Risk (03/24/2024)  Alcohol Screen: Low Risk  (10/16/2023)  Depression (PHQ2-9): Low Risk  (10/23/2023)  Financial Resource Strain: Medium Risk (10/16/2023)  Physical Activity: Unknown (10/16/2023)  Social Connections: Socially Isolated (10/16/2023)  Stress: Stress Concern Present (10/16/2023)  Tobacco Use: Medium Risk (03/24/2024)     Readmission Risk Interventions     No data to display

## 2024-03-25 NOTE — Plan of Care (Signed)
  Problem: Activity: Goal: Risk for activity intolerance will decrease Outcome: Progressing   Problem: Skin Integrity: Goal: Risk for impaired skin integrity will decrease Outcome: Progressing   Problem: Coping: Goal: Level of anxiety will decrease Outcome: Not Progressing

## 2024-03-25 NOTE — TOC CM/SW Note (Addendum)
 Transition of Care [facility]) - Inpatient Brief Assessment   Patient Details  Name: Cameron Jenkins MRN: [id number] Date of Birth: Jun 18, 1976  Transition of Care [facility]) CM/SW Contact:    Luise JAYSON Pan, LCSWA Phone Number: 03/25/2024, 10:40 AM   Clinical Narrative: CSW met patient at bedside to address SDOH needs and provide resources. Patient politely declined resources at this time.   TOC no longer needed, please reach out if needed.   Transition of Care Asessment: Insurance and Status: Insurance coverage has been reviewed Patient has primary care physician: Yes Home environment has been reviewed: home alone Prior level of function:: indep Prior/Current Home Services: No current home services Social Drivers of Health Review: SDOH reviewed no interventions necessary Readmission risk has been reviewed: Yes Transition of care needs: no transition of care needs at this time

## 2024-03-25 NOTE — Discharge Instructions (Signed)
 After Your ICD (Implantable Cardiac Defibrillator)   You have a Medtronic ICD  ACTIVITY Do not lift your arm above shoulder height for 1 week after your procedure. After 7 days, you may progress as below.  You should remove your sling 24 hours after your procedure, unless otherwise instructed by your provider.     Thursday April 01, 2024  Friday April 02, 2024 Saturday April 03, 2024 Sunday April 04, 2024   Do not lift, push, pull, or carry anything over 10 pounds with the affected arm until 6 weeks (Thursday May 06, 2024 ) after your procedure.   You may drive AFTER your wound check, unless you have been told otherwise by your provider.   Ask your healthcare provider when you can go back to work   INCISION/Dressing If you are on a blood thinner such as Coumadin, Xarelto, Eliquis, Plavix, or Pradaxa please confirm with your provider when this should be resumed.   If large square, outer bandage is left in place, this can be removed after 24 hours from your procedure. Do not remove steri-strips or glue as below.   Monitor your defibrillator site for redness, swelling, and drainage. Call the device clinic at [phone] if you experience these symptoms or fever/chills.  If your incision is sealed with Steri-strips or staples, you may shower 7 days after your procedure or when told by your provider. Do not remove the steri-strips or let the shower hit directly on your site. You may wash around your site with soap and water.    If you were discharged in a sling, please do not wear this during the day more than 48 hours after your surgery unless otherwise instructed. This may increase the risk of stiffness and soreness in your shoulder.   Avoid lotions, ointments, or perfumes over your incision until it is well-healed.  You may use a hot tub or a pool AFTER your wound check appointment if the incision is completely closed.  Your ICD is designed to protect you from life threatening heart  rhythms. Because of this, you may receive a shock.   1 shock with no symptoms:  Call the office during business hours. 1 shock with symptoms (chest pain, chest pressure, dizziness, lightheadedness, shortness of breath, overall feeling unwell):  Call 911. If you experience 2 or more shocks in 24 hours:  Call 911. If you receive a shock, you should not drive for 6 months per the [place] DMV IF you receive appropriate therapy from your ICD.   ICD Alerts:  Some alerts are vibratory and others beep. These are NOT emergencies. Please call our office to let us  know. If this occurs at night or on weekends, it can wait until the next business day. Send a remote transmission.  If your device is capable of reading fluid status (for heart failure), you will be offered monthly monitoring to review this with you.   DEVICE MANAGEMENT Remote monitoring is used to monitor your ICD from home. This monitoring is scheduled every 91 days by our office. It allows us  to keep an eye on the functioning of your device to ensure it is working properly. You will routinely see your Electrophysiologist annually (more often if necessary).   You should receive your ID card for your new device in 4-8 weeks. Keep this card with you at all times once received. Consider wearing a medical alert bracelet or necklace.  Your ICD  may be MRI compatible. This will be discussed at your next  office visit/wound check.  You should avoid contact with strong electric or magnetic fields.   Do not use amateur (ham) radio equipment or electric (arc) welding torches. MP3 player headphones with magnets should not be used. Some devices are safe to use if held at least 12 inches (30 cm) from your defibrillator. These include power tools, lawn mowers, and speakers. If you are unsure if something is safe to use, ask your health care provider.  When using your cell phone, hold it to the ear that is on the opposite side from the defibrillator. Do not leave  your cell phone in a pocket over the defibrillator.  You may safely use electric blankets, heating pads, computers, and microwave ovens.  Call the office right away if: You have chest pain. You feel more than one shock. You feel more short of breath than you have felt before. You feel more light-headed than you have felt before. Your incision starts to open up.  This information is not intended to replace advice given to you by your health care provider. Make sure you discuss any questions you have with your health care provider.

## 2024-03-25 NOTE — Discharge Summary (Addendum)
 ELECTROPHYSIOLOGY PROCEDURE DISCHARGE SUMMARY    Patient ID: Cameron Jenkins,  MRN: [id number], DOB/AGE: 1976-03-25 48 y.o.  Admit date: 03/24/2024 Discharge date: 03/25/2024  Primary Care Physician: Cyndi Shaver, PA-C  Primary Cardiologist: None  Electrophysiologist: Dr. Kennyth    Primary Diagnosis:  Chronic systolic CHF  LBBB  Secondary Diagnosis: NICM OSA H/o COPD  Allergies  Allergen Reactions   Acetaminophen  Nausea Only     Procedures This Admission:  1.  Implantation of a Medtronic BiV ICD on 03/24/2024 by Dr. Kennyth.  The patient received a Medtronic Cobalt XT DR [id number] with Medtronic CapSureFix Novus 5076 right atrial lead and Medtronic Sprint Quattro Secure [id number] right ventricular lead. Pt received a Medtronic SelectSure 3830 in the Left bundle position. DFTs were deferred at time of implant There were no post procedure complications 2.  CXR on 03/25/24 demonstrated no pneumothorax status post device implantation.      Brief HPI: Cameron Jenkins is a 48 y.o. male was referred to electrophysiology in the outpatient setting  for consideration of ICD implantation.  Past medical history includes above.  The patient has persistent LV dysfunction despite guideline directed therapy.  Risks, benefits, and alternatives to ICD implantation were reviewed with the patient who wished to proceed.   Hospital Course:  The patient was admitted and underwent implantation of a Medtronic BiV ICD with details as outlined above. They were monitored on telemetry overnight which demonstrated appropriate pacing .  Left chest was without hematoma or ecchymosis.  The device was interrogated and found to be functioning normally.  CXR was obtained and demonstrated no pneumothorax status post device implantation..  Wound care, arm mobility, and restrictions were reviewed with the patient.  The patient was examined and considered stable for discharge to home.   The patient's discharge  medications include an ACE-I/ARB/ARNI (Entresto ) and beta blocker (Coreg ).   Anticoagulation resumption This patient is not on anticoagulation.  Physical Exam: Vitals:   03/25/24 0009 03/25/24 0445 03/25/24 0740 03/25/24 0744  BP: 92/68 108/85  (!) 144/82  Pulse: 87 85  98  Resp: 18 18  20   Temp: 97.7 F (36.5 C) 97.7 F (36.5 C)  98.6 F (37 C)  TempSrc: Oral Oral  Oral  SpO2: 98% 96% 96% 96%  Weight:  134.4 kg    Height:        GEN- NAD. A&O x 3.  HEENT: Normocephalic, atraumatic Lungs- CTAB, normal effort.  Heart- RRR. No M/G/R.  GI- Soft, NT, ND.  Extremities- No clubbing, cyanosis, or edema Skin- Warm and dry, no rash or lesion. ICD site stable.  Discharge Medications:  Allergies as of 03/25/2024       Reactions   Acetaminophen  Nausea Only        Medication List     STOP taking these medications    hydrocortisone  25 MG suppository Commonly known as: Anucort-HC    mupirocin  ointment 2 % Commonly known as: BACTROBAN    oseltamivir  75 MG capsule Commonly known as: Tamiflu        TAKE these medications    albuterol  (2.5 MG/3ML) 0.083% nebulizer solution Commonly known as: PROVENTIL  Take 3 mLs (2.5 mg total) by nebulization every 6 (six) hours as needed for wheezing or shortness of breath. What changed: when to take this   carvedilol  6.25 MG tablet Commonly known as: Coreg  Take 1 tablet (6.25 mg total) by mouth 2 (two) times daily.   digoxin  0.125 MG tablet Commonly known as: LANOXIN  Take 1 tablet (0.125 mg  total) by mouth daily.   Entresto  24-26 MG Generic drug: sacubitril -valsartan  Take 1 tablet by mouth 2 (two) times daily.   Jardiance  10 MG Tabs tablet Generic drug: empagliflozin  Take 1 tablet (10 mg total) by mouth daily before breakfast.   LORazepam  1 MG tablet Commonly known as: ATIVAN  Take 20-30 minutes before lab appointment   metolazone  2.5 MG tablet Commonly known as: ZAROXOLYN  Take 1 tablet (2.5 mg total) by mouth once a  week. Take 1 tablet today and then 1 tablet 2.5 mg every Friday 40 meq every Potassium   potassium chloride  SA 20 MEQ tablet Commonly known as: KLOR-CON  M Take 3 tablets (60 mEq total) by mouth 2 (two) times daily. Take an extra 2 tabs when you take metolazone  What changed: how much to take   spironolactone  25 MG tablet Commonly known as: ALDACTONE  TAKE 1 TABLET(25 MG) BY MOUTH DAILY   Symbicort  160-4.5 MCG/ACT inhaler Generic drug: budesonide -formoterol  Inhale 2 puffs into the lungs 2 (two) times daily. What changed:  how much to take when to take this   torsemide  20 MG tablet Commonly known as: DEMADEX  Take 2 tablets (40 mg total) by mouth 2 (two) times daily.        Disposition: Home with usual follow up as in AVS  Duration of Discharge Encounter:  APP time: 26 minutes  Signed, Ozell Prentice Passey, PA-C  03/25/2024 11:57 AM  I have seen, examined the patient, and reviewed the above assessment and plan.    Hospital Course: Patient presented for scheduled CRT-D implant.  He underwent routine monitoring overnight.  There were no acute complications.  On the morning of discharge, patient reported left chest soreness.  Otherwise he was feeling relatively well.  General: Well developed, in no acute distress.  Neck: No JVD.  Cardiac: Normal rate, regular rhythm.  Left chest ICD pocket without hematoma or bleeding. Resp: Normal work of breathing.  Ext: No edema.  Neuro: No gross focal deficits.  Psych: Normal affect.   Assessment and Plan:  #. Chronic systolic heart failure  #. LBBB - Chest x-ray with appropriate lead position. -Bedside device check with appropriate device function and stable lead parameters.  We have shortened AV delays and programmed ventricular pacing to LV only.  ECG consistent with capture of the left sided conduction system. -Usual post implant instructions provided regarding wound care and activity restrictions. -Patient will follow-up in  device clinic in approximately 2 weeks.  Fonda Kitty, MD, [facility], [facility] [place] Cardiac Electrophysiology

## 2024-03-25 NOTE — Progress Notes (Signed)
 Vss. Patient has no complaints. [facility] notified of dc order. All belongings given to patient.volunteer services notified patients ride is here.

## 2024-03-26 MED FILL — Midazolam HCl Inj 2 MG/2ML (Base Equivalent): INTRAMUSCULAR | Qty: 5 | Status: AC

## 2024-03-29 ENCOUNTER — Inpatient Hospital Stay: Payer: MEDICAID | Admitting: Physician Assistant

## 2024-03-30 NOTE — Procedures (Addendum)
 [facility] [address] [place], [place] [address] Tel: [phone]   Fax: [phone]  Split Night Interpretation  Patient Name:  Cameron Jenkins, Cameron Jenkins Date:  03/21/2024 Referring Physician:  CHERRIE TORIBIO SAUNDERS, MD  Indications for Polysomnography The patient is a 48 year old Male who is 6' 1 and weighs 290.0 lbs.  His BMI equals 38.4.  A diagnostic polysomnogram was performed to evaluate for obstructive sleep apnea.  After 112.5 minutes of sleep time the patient exhibited sufficient respiratory events qualifying him for a CPAP trial which was then initiated.    Medications taken at 2015:  ENTRESTO   CAVEDILOL  TORSEMIDE   SYMBICORT    Polysomnogram Data A full night polysomnogram was performed recording the standard physiologic parameters including EEG, EOG, EMG, EKG, nasal and oral airflow.  Respiratory parameters of chest and abdominal movements are recorded with Peizo-Crystal motion transducers.  Oxygen saturation was recorded by pulse oximetry.    Sleep Architecture The total recording time of the diagnostic portion of the study was 236.6 minutes.  The total sleep time was 112.5 minutes.  During the diagnostic portion of the study, the patient spent 5.3% of total sleep time in Stage N1, 1.8% in Stage N2, 92.4% in Stages N3, and 0.4% in REM.   Sleep latency was 97.0 minutes.  REM latency was 40.5 minutes.  Sleep Efficiency was 47.6%.  Wake after Sleep Onset time was 27.0 minutes.   At 02:05:03 AM the patient was placed on PAP treatment and was titrated at pressures ranging from 8/2 cm/H20 up to 22/18 cm/H20. The total recording time of the treatment portion of the study was 228.7 minutes.  The total sleep time was 210.5 minutes.  During the treatment portion of the study, the patient spent 1.7% of total sleep time in Stage N1, 0.0% in Stage N2, 81.0% in Stages N3, and 17.3% in REM.   Sleep latency was 11.0 minutes.  REM latency was 176.0 minutes.   Sleep Efficiency was 92.1%.  Wake after Sleep Onset time was 7.5 minutes.  Respiratory Events During the diagnostic portion of the study, the polysomnogram revealed a presence of 10 obstructive, 20 central, and 23 mixed apneas resulting in an Apnea index of 28.3 events per hour.  There were 40 hypopneas (>=3% desaturation and/or arousal) resulting in an Apnea\Hypopnea Index (AHI >=3% desaturation and/or arousal) of 49.6 events per hour.  There were 27 hypopneas (>=4% desaturation) resulting in an Apnea\Hypopnea Index (AHI >=4% desaturation) of 42.7 events per hour.  There were 16 Respiratory Effort Related Arousals resulting in a RERA index of 8.5 events per hour. The Respiratory Disturbance Index is 58.1 events per hour.  The snore index was 0 events per hour.  Mean oxygen saturation was 92.1%.  The lowest oxygen saturation during sleep was 74.0%.  Time spent <=88% oxygen saturation was 24.0 minutes (11.0%).  During the treatment portion of the study, the polysomnogram revealed a presence of 12 obstructive, 109 central, and 5 mixed apneas resulting in an Apnea index of 35.9 events per hour.  There were 88 hypopneas (>=3% desaturation and/or arousal) resulting in an Apnea\Hypopnea Index (AHI >=3% desaturation and/or arousal) of 61.0 events per hour.  There were 81 hypopneas (>=4% desaturation) resulting in an Apnea\Hypopnea Index (AHI >=4% desaturation) of 59.0 events per hour.  There were 5 Respiratory Effort Related Arousals resulting in a RERA index of 1.4 events per hour. The Respiratory Disturbance Index is 62.4 events per hour.  The snore index was 0  events per hour.  Mean oxygen saturation was 88.4%.  The lowest oxygen saturation during sleep was 60.0%.  Time spent <=88% oxygen saturation was 95.0 minutes (41.7%).  Limb Activity During the diagnostic portion of the study, there were 0 limb movements recorded.    During the treatment portion of the study, there were 0 limb movements recorded.     Cardiac Summary During the diagnostic portion of the study, the average pulse rate was 89.9 bpm.  The minimum pulse rate was 50.0 bpm while the maximum pulse rate was 106.0 bpm.  Undetermined rhythm possibly SVT versus atrial flutter  During the treatment portion of the study, the average pulse rate was 82.2 bpm.  The minimum pulse rate was 57.0 bpm while the maximum pulse rate was 103.0 bpm.  Undetermined rhythm possibly SVT versus atrial flutter  Diagnosis:  Severe Obstructive Sleep apnea Moderate Central Sleep Apnea Nocturnal Hypoxemia  Recommendations: Recommend a trial of ResMed BiPAP S/T at 22/18cm H2O with back up rate of 17/min, heated humidity and Medium F&P Simplus FFM. Close follow-up is necessary to ensure success with PAP therapy for maximum benefit. A follow-up oximetry study on BiPAP is recommended to assess the adequacy of therapy and determine the need for supplemental oxygen.  An arterial blood gas to determine the adequacy of baseline ventilation and oxygenation should also be considered. Healthy sleep recommendations include:  adequate nightly sleep (normal 7-9 hrs/night), avoidance of caffeine after noon and alcohol near bedtime, and maintaining a sleep environment that is cool, dark and quiet. Weight loss for overweight patients is recommended.  Even modest amounts of weight loss can significantly improve the severity of sleep apnea. Snoring recommendations include:  weight loss where appropriate, side sleeping, and avoidance of alcohol before bed. Operation of motor vehicle should be avoided when sleepy. Followup in Sleep Medicine clinic in 6 weeks.    This study was personally reviewed and electronically signed by: SHLOMO WILBERT SAUNDERS., MD Accredited Board Certified in Sleep Medicine Date/Time: 04/05/2024 10:29PM

## 2024-04-03 ENCOUNTER — Ambulatory Visit: Payer: Self-pay | Admitting: Cardiology

## 2024-04-05 NOTE — Procedures (Addendum)
 [facility] [address] [place], [place] [address] Tel: [phone]   Fax: [phone]   Split Night Interpretation   Patient Name:  Cameron Jenkins, Cameron Jenkins Date:  03/21/2024 Referring Physician:  CHERRIE TORIBIO SAUNDERS, MD   Indications for Polysomnography The patient is a 48 year old Male who is 6' 1 and weighs 290.0 lbs.  His BMI equals 38.4.  A diagnostic polysomnogram was performed to evaluate for obstructive sleep apnea.  After 112.5 minutes of sleep time the patient exhibited sufficient respiratory events qualifying him for a CPAP trial which was then initiated.     Medications taken at 2015:  ENTRESTO   CAVEDILOL  TORSEMIDE   SYMBICORT     Polysomnogram Data A full night polysomnogram was performed recording the standard physiologic parameters including EEG, EOG, EMG, EKG, nasal and oral airflow.  Respiratory parameters of chest and abdominal movements are recorded with Peizo-Crystal motion transducers.  Oxygen saturation was recorded by pulse oximetry.     Sleep Architecture The total recording time of the diagnostic portion of the study was 236.6 minutes.  The total sleep time was 112.5 minutes.  During the diagnostic portion of the study, the patient spent 5.3% of total sleep time in Stage N1, 1.8% in Stage N2, 92.4% in Stages N3, and 0.4% in REM.   Sleep latency was 97.0 minutes.  REM latency was 40.5 minutes.  Sleep Efficiency was 47.6%.  Wake after Sleep Onset time was 27.0 minutes.    At 02:05:03 AM the patient was placed on PAP treatment and was titrated at pressures ranging from 8/2 cm/H20 up to 22/18 cm/H20. The total recording time of the treatment portion of the study was 228.7 minutes.  The total sleep time was 210.5 minutes.  During the treatment portion of the study, the patient spent 1.7% of total sleep time in Stage N1, 0.0% in Stage N2, 81.0% in Stages N3, and 17.3% in REM.   Sleep latency was 11.0 minutes.  REM latency was 176.0  minutes.  Sleep Efficiency was 92.1%.  Wake after Sleep Onset time was 7.5 minutes.   Respiratory Events During the diagnostic portion of the study, the polysomnogram revealed a presence of 10 obstructive, 20 central, and 23 mixed apneas resulting in an Apnea index of 28.3 events per hour.  There were 40 hypopneas (>=3% desaturation and/or arousal) resulting in an Apnea\Hypopnea Index (AHI >=3% desaturation and/or arousal) of 49.6 events per hour.  There were 27 hypopneas (>=4% desaturation) resulting in an Apnea\Hypopnea Index (AHI >=4% desaturation) of 42.7 events per hour.  There were 16 Respiratory Effort Related Arousals resulting in a RERA index of 8.5 events per hour. The Respiratory Disturbance Index is 58.1 events per hour.  The snore index was 0 events per hour.  Mean oxygen saturation was 92.1%.  The lowest oxygen saturation during sleep was 74.0%.  Time spent <=88% oxygen saturation was 24.0 minutes (11.0%).   During the treatment portion of the study, the polysomnogram revealed a presence of 12 obstructive, 109 central, and 5 mixed apneas resulting in an Apnea index of 35.9 events per hour.  There were 88 hypopneas (>=3% desaturation and/or arousal) resulting in an Apnea\Hypopnea Index (AHI >=3% desaturation and/or arousal) of 61.0 events per hour.  There were 81 hypopneas (>=4% desaturation) resulting in an Apnea\Hypopnea Index (AHI >=4% desaturation) of 59.0 events per hour.  There were 5 Respiratory Effort Related Arousals resulting in a RERA index of 1.4 events per hour. The Respiratory Disturbance Index is  62.4 events per hour.  The snore index was 0 events per hour.  Mean oxygen saturation was 88.4%.  The lowest oxygen saturation during sleep was 60.0%.  Time spent <=88% oxygen saturation was 95.0 minutes (41.7%).   Limb Activity During the diagnostic portion of the study, there were 0 limb movements recorded.     During the treatment portion of the study, there were 0 limb movements  recorded.     Cardiac Summary During the diagnostic portion of the study, the average pulse rate was 89.9 bpm.  The minimum pulse rate was 50.0 bpm while the maximum pulse rate was 106.0 bpm.  Undetermined rhythm possibly SVT versus atrial flutter   During the treatment portion of the study, the average pulse rate was 82.2 bpm.  The minimum pulse rate was 57.0 bpm while the maximum pulse rate was 103.0 bpm.  Undetermined rhythm possibly SVT versus atrial flutter   Diagnosis:  Severe Obstructive Sleep apnea Moderate Central Sleep Apnea Nocturnal Hypoxemia   Recommendations: Recommend a trial of ResMed BiPAP S/T at 22/18cm H2O with back up rate of 17/min, heated humidity and Medium F&P Simplus FFM. Close follow-up is necessary to ensure success with PAP therapy for maximum benefit. A follow-up oximetry study on BiPAP is recommended to assess the adequacy of therapy and determine the need for supplemental oxygen.  An arterial blood gas to determine the adequacy of baseline ventilation and oxygenation should also be considered. Healthy sleep recommendations include:  adequate nightly sleep (normal 7-9 hrs/night), avoidance of caffeine after noon and alcohol near bedtime, and maintaining a sleep environment that is cool, dark and quiet. Weight loss for overweight patients is recommended.  Even modest amounts of weight loss can significantly improve the severity of sleep apnea. Snoring recommendations include:  weight loss where appropriate, side sleeping, and avoidance of alcohol before bed. Operation of motor vehicle should be avoided when sleepy. Followup in Sleep Medicine clinic in 6 weeks.       This study was personally reviewed and electronically signed by: SHLOMO WILBERT SAUNDERS., MD Accredited Board Certified in Sleep Medicine Date/Time: 04/05/2024 10:29PM

## 2024-04-06 ENCOUNTER — Ambulatory Visit: Payer: MEDICAID | Attending: Cardiology

## 2024-04-06 DIAGNOSIS — I447 Left bundle-branch block, unspecified: Secondary | ICD-10-CM | POA: Diagnosis present

## 2024-04-06 LAB — CUP PACEART INCLINIC DEVICE CHECK
Date Time Interrogation Session: 20250722161622
Implantable Lead Connection Status: 753985
Implantable Lead Connection Status: 753985
Implantable Lead Connection Status: 753985
Implantable Lead Implant Date: 20250709
Implantable Lead Implant Date: 20250709
Implantable Lead Implant Date: 20250709
Implantable Lead Location: 753858
Implantable Lead Location: 753859
Implantable Lead Location: 753860
Implantable Lead Model: 3830
Implantable Lead Model: 5076
Implantable Pulse Generator Implant Date: 20250709

## 2024-04-06 NOTE — Patient Instructions (Signed)

## 2024-04-06 NOTE — Progress Notes (Signed)
 Normal multi chamber ICD wound check. Wound well healed. Presenting rhythm: AS/VP 94 . Routine testing performed. Thresholds, sensing, and impedances consistent with implant measurements. No treated arrhythmias. Reviewed arm restrictions to continue for 6 weeks total post op. Reviewed shock plan.  Pt enrolled in remote follow-up.

## 2024-04-14 ENCOUNTER — Other Ambulatory Visit (HOSPITAL_COMMUNITY): Payer: Self-pay | Admitting: Internal Medicine

## 2024-04-14 ENCOUNTER — Other Ambulatory Visit (HOSPITAL_BASED_OUTPATIENT_CLINIC_OR_DEPARTMENT_OTHER): Payer: Self-pay

## 2024-04-14 ENCOUNTER — Other Ambulatory Visit: Payer: Self-pay

## 2024-04-14 ENCOUNTER — Other Ambulatory Visit: Payer: Self-pay | Admitting: Physician Assistant

## 2024-04-14 ENCOUNTER — Other Ambulatory Visit (HOSPITAL_COMMUNITY): Payer: Self-pay

## 2024-04-14 DIAGNOSIS — J449 Chronic obstructive pulmonary disease, unspecified: Secondary | ICD-10-CM

## 2024-04-14 MED ORDER — BUDESONIDE-FORMOTEROL FUMARATE 160-4.5 MCG/ACT IN AERO
2.0000 | INHALATION_SPRAY | Freq: Two times a day (BID) | RESPIRATORY_TRACT | 3 refills | Status: AC
  Filled 2024-04-14: qty 10.2, 30d supply, fill #0
  Filled 2024-05-18: qty 10.2, 30d supply, fill #1
  Filled 2024-06-28: qty 10.2, 30d supply, fill #2

## 2024-04-15 ENCOUNTER — Other Ambulatory Visit: Payer: Self-pay

## 2024-04-15 ENCOUNTER — Other Ambulatory Visit (HOSPITAL_COMMUNITY): Payer: Self-pay | Admitting: Internal Medicine

## 2024-04-15 ENCOUNTER — Other Ambulatory Visit (HOSPITAL_COMMUNITY): Payer: Self-pay

## 2024-04-15 MED FILL — Empagliflozin Tab 10 MG: ORAL | 90 days supply | Qty: 90 | Fill #0 | Status: AC

## 2024-04-29 ENCOUNTER — Other Ambulatory Visit (HOSPITAL_COMMUNITY): Payer: Self-pay

## 2024-04-29 ENCOUNTER — Telehealth (HOSPITAL_COMMUNITY): Payer: Self-pay | Admitting: Family Medicine

## 2024-04-29 ENCOUNTER — Telehealth (HOSPITAL_COMMUNITY): Payer: Self-pay

## 2024-04-29 NOTE — Telephone Encounter (Signed)
 Called to confirm/remind patient of their appointment at the [facility] on 04/29/24.   Appointment:   [] Confirmed  [x] Left mess   [] No answer/No voice mail  [] VM Full/unable to leave message  [] Phone not in service  And to bring in all medications and/or complete list.

## 2024-04-29 NOTE — Telephone Encounter (Signed)
 Called to confirm/remind patient of their appointment at the [facility] on 04/29/24.   Appointment:   [x] Confirmed  [] Left mess   [] No answer/No voice mail  [] VM Full/unable to leave message  [] Phone not in service  Patient reminded to bring all medications and/or complete list.  Confirmed patient has transportation. Gave directions, instructed to utilize valet parking.

## 2024-04-30 ENCOUNTER — Other Ambulatory Visit (HOSPITAL_COMMUNITY): Payer: Self-pay

## 2024-04-30 ENCOUNTER — Ambulatory Visit (HOSPITAL_COMMUNITY)
Admission: RE | Admit: 2024-04-30 | Discharge: 2024-04-30 | Disposition: A | Discharge status: Home / Self Care | Payer: MEDICAID | Source: Ambulatory Visit | Attending: Cardiology | Admitting: Cardiology

## 2024-04-30 ENCOUNTER — Other Ambulatory Visit: Payer: Self-pay

## 2024-04-30 ENCOUNTER — Telehealth (HOSPITAL_COMMUNITY): Payer: Self-pay

## 2024-04-30 ENCOUNTER — Ambulatory Visit (HOSPITAL_COMMUNITY): Payer: Self-pay | Admitting: Cardiology

## 2024-04-30 ENCOUNTER — Encounter (HOSPITAL_COMMUNITY): Payer: Self-pay

## 2024-04-30 VITALS — BP 118/68 | HR 103 | Ht 73.0 in | Wt 302.0 lb

## 2024-04-30 DIAGNOSIS — G4736 Sleep related hypoventilation in conditions classified elsewhere: Secondary | ICD-10-CM | POA: Insufficient documentation

## 2024-04-30 DIAGNOSIS — F1911 Other psychoactive substance abuse, in remission: Secondary | ICD-10-CM | POA: Insufficient documentation

## 2024-04-30 DIAGNOSIS — Z87891 Personal history of nicotine dependence: Secondary | ICD-10-CM | POA: Insufficient documentation

## 2024-04-30 DIAGNOSIS — Z79899 Other long term (current) drug therapy: Secondary | ICD-10-CM | POA: Diagnosis not present

## 2024-04-30 DIAGNOSIS — F419 Anxiety disorder, unspecified: Secondary | ICD-10-CM | POA: Insufficient documentation

## 2024-04-30 DIAGNOSIS — I5022 Chronic systolic (congestive) heart failure: Secondary | ICD-10-CM | POA: Diagnosis present

## 2024-04-30 DIAGNOSIS — G4737 Central sleep apnea in conditions classified elsewhere: Secondary | ICD-10-CM | POA: Insufficient documentation

## 2024-04-30 DIAGNOSIS — Z95810 Presence of automatic (implantable) cardiac defibrillator: Secondary | ICD-10-CM

## 2024-04-30 DIAGNOSIS — I447 Left bundle-branch block, unspecified: Secondary | ICD-10-CM | POA: Diagnosis not present

## 2024-04-30 DIAGNOSIS — I159 Secondary hypertension, unspecified: Secondary | ICD-10-CM

## 2024-04-30 DIAGNOSIS — I110 Hypertensive heart disease with heart failure: Secondary | ICD-10-CM | POA: Diagnosis present

## 2024-04-30 DIAGNOSIS — I428 Other cardiomyopathies: Secondary | ICD-10-CM | POA: Insufficient documentation

## 2024-04-30 DIAGNOSIS — G4734 Idiopathic sleep related nonobstructive alveolar hypoventilation: Secondary | ICD-10-CM

## 2024-04-30 DIAGNOSIS — Z8674 Personal history of sudden cardiac arrest: Secondary | ICD-10-CM | POA: Insufficient documentation

## 2024-04-30 DIAGNOSIS — G4731 Primary central sleep apnea: Secondary | ICD-10-CM

## 2024-04-30 DIAGNOSIS — J449 Chronic obstructive pulmonary disease, unspecified: Secondary | ICD-10-CM | POA: Diagnosis not present

## 2024-04-30 DIAGNOSIS — G4733 Obstructive sleep apnea (adult) (pediatric): Secondary | ICD-10-CM | POA: Diagnosis not present

## 2024-04-30 LAB — BASIC METABOLIC PANEL WITH GFR
Anion gap: 10 (ref 5–15)
BUN: 8 mg/dL (ref 6–20)
CO2: 28 mmol/L (ref 22–32)
Calcium: 9.1 mg/dL (ref 8.9–10.3)
Chloride: 99 mmol/L (ref 98–111)
Creatinine, Ser: 1.02 mg/dL (ref 0.61–1.24)
GFR, Estimated: >60 mL/min (ref >60)
Glucose, Bld: 153 mg/dL — ABNORMAL HIGH (ref 70–99)
Potassium: 3.4 mmol/L — ABNORMAL LOW (ref 3.5–5.1)
Sodium: 137 mmol/L (ref 135–145)

## 2024-04-30 LAB — BRAIN NATRIURETIC PEPTIDE: B Natriuretic Peptide: 268.6 pg/mL — ABNORMAL HIGH (ref 0.0–100.0)

## 2024-04-30 MED ORDER — POTASSIUM CHLORIDE CRYS ER 20 MEQ PO TBCR
40.0000 meq | EXTENDED_RELEASE_TABLET | Freq: Two times a day (BID) | ORAL | 11 refills | Status: AC
  Filled 2024-04-30: qty 60, 10d supply, fill #0
  Filled 2024-06-28: qty 60, 10d supply, fill #1
  Filled 2024-07-07: qty 60, 10d supply, fill #2

## 2024-04-30 MED ORDER — DIGOXIN 125 MCG PO TABS
0.1250 mg | ORAL_TABLET | Freq: Every day | ORAL | 11 refills | Status: AC
  Filled 2024-04-30 – 2024-05-18 (×2): qty 30, 30d supply, fill #0
  Filled 2024-06-28: qty 30, 30d supply, fill #1

## 2024-04-30 NOTE — Progress Notes (Signed)
 ReDS Vest / Clip - 04/30/24 1342       ReDS Vest / Clip   Station Marker D    Ruler Value 46.5    ReDS Value Range Low volume    ReDS Actual Value 31

## 2024-04-30 NOTE — Progress Notes (Signed)
 ADVANCED [facility] CLINIC NOTE  Primary Care: Cyndi Shaver, PA-C HF Cardiologist: Dr. Cherrie  HPI: 48 y.o. male with HFrEF sue to NICM, LBBB, HTN, OSA, COPD, anxiety, prior tobacco use, PEA arrest, and polysubstance/IVDU abuse.  Diagnosed with HF in Feb 2021 while admitted to a hospital in [place]  with CHF. No records from admission available. There was mention of possible LVAD placement and was discharged with LifeVest. He lost insurance and was not seen for follow-up.   He established care with Dr. Bernie in 11/21. Last seen in June 2022. Admitted to psych unit in September 2022 for detox from heroin. He had episode of syncope/? Possible asystole. Had very brief CPR. He was transferred to ICU at [facility]. Echo with EF 15-20%. EP considered placement of CRT-D. Defibrillator placement deferred until social situation more stable (he was homeless at the time). Did not follow-up with Cardiology after discharge.   Seen in ED at [facility] on 01/23 with chest tightness and dyspnea. Left before evaluated. Re-presented with a/c CHF. Had been out of medications 2.5 months. UDS + for amphetamines. Echo  EF < 20%, RV moderate to severely reduced, mild MR, dilated IVC. He refused cath or other HF therapies.   Cath 4/24: Normal cors. EF < 20% RA 8 PA 57/32 (42) PCW 17 Fick 5.5/2.2 PVR 4.5  Echo 1/24 EF 20-25%  RV normal   RHC 1/25:  elevated filling pressures, CO 4.5, and CI 1.8.    He returns today for heart failure follow up. Overall feeling fine. NYHA II. Drinking lots of fluids, but reports drinking a lot. Reports fatigue and lower extremity edema. Denies chest pain, palpitations, and dizziness. Able to perform ADLs. Appetite okay. Weight at home gradually up. BP at home 120/70-80. Compliant with all medications.  Cardiac Studies: - RHC (1/25): RA 12, PA 59/43 (48), PCW 19, Fick CO/CI 4.5/1.8, PVR  6.4, PAPi = 3,5  - CPX (5/24): FVC 5.01 (85%), FEV1 4.02 (87%), FEV1/FVC 80 (101%), BP rest: 126/70,  Standing BP: 120/74, peak: 168/74, Peak VO2: 19.4 (70% predicted peak VO2) - when corrected for ibw 28.3 ml/kg/min, VE/VCO2 slope: 37  Peak RER: 1.09  - R/LHC (1/24): RA 8, PA  57/32 (42), PCW  17, Fick CO/CI  5.5/2.2, PVR  4.5; normal cors - Echo (1/24): EF 20-25%, RV normal - Echo (1/23): EF < 20%, RV moderate to severely reduced, mild MR, dilated IVC. - Echo (11/21) [facility] EF 15-20%  Past Medical History:  Diagnosis Date   Acute on chronic systolic congestive heart failure (HCC) 05/28/2020   Formatting of this note might be different from the original. IMO update 2021   Allergy    Since grade school age   Anemia    Not sure about this   Asthma 01/1992   Chest pain 05/28/2020   Congestive heart failure (CHF) (HCC) 07/21/2020   COPD (chronic obstructive pulmonary disease) (HCC) 2014   Depression 1993   Dilated cardiomyopathy (HCC) 07/21/2020   Dyspnea on exertion 07/21/2020   Essential hypertension 05/28/2020   Generalized anxiety disorder 05/28/2020   GERD (gastroesophageal reflux disease) 1991   Heart failure (HCC)    Heart murmur Birth   LBBB (left bundle branch block) 05/28/2020   Nausea and vomiting 05/28/2020   Obstructive sleep apnea 07/21/2020   Pneumonia due to COVID-19 virus 05/28/2020   Formatting of this note might be different from the original. Diagnosed by SARS-CoV-2 PCR Cephid test at [facility] in [place], [place] on 05/28/2020.  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 10.2 g 3   carvedilol  (COREG ) 6.25 MG tablet Take 1 tablet (6.25 mg total) by mouth 2 (two) times daily. 60 tablet 11   digoxin  (LANOXIN ) 0.125 MG tablet Take 1 tablet (0.125 mg total) by mouth daily. 30 tablet 3   empagliflozin  (JARDIANCE ) 10 MG  TABS tablet Take 1 tablet (10 mg total) by mouth daily before breakfast. 90 tablet 3   LORazepam  (ATIVAN ) 1 MG tablet Take 20-30 minutes before lab appointment (Patient taking differently: Take 1 mg by mouth as needed for anxiety. Take 20-30 minutes before lab appointment) 1 tablet 0   metolazone  (ZAROXOLYN ) 2.5 MG tablet Take 1 tablet (2.5 mg total) by mouth once a week. Take 1 tablet today and then 1 tablet 2.5 mg every Friday 40 meq every Potassium 5 tablet 5   potassium chloride  SA (KLOR-CON  M) 20 MEQ tablet Take 3 tablets (60 mEq total) by mouth 2 (two) times daily. Take an extra 2 tabs when you take metolazone  (Patient taking differently: Take 40 mEq by mouth 2 (two) times daily. Take an extra 2 tabs when you take metolazone )     sacubitril -valsartan  (ENTRESTO ) 24-26 MG Take 1 tablet by mouth 2 (two) times daily. 180 tablet 3   spironolactone  (ALDACTONE ) 25 MG tablet TAKE 1 TABLET(25 MG) BY MOUTH DAILY 90 tablet 2   torsemide  (DEMADEX ) 20 MG tablet Take 2 tablets (40 mg total) by mouth 2 (two) times daily. 120 tablet 11   albuterol  (PROVENTIL ) (2.5 MG/3ML) 0.083% nebulizer solution Take 3 mLs (2.5 mg total) by nebulization every 6 (six) hours as needed for wheezing or shortness of breath. (Patient not taking: Reported on 04/30/2024) 180 mL 1   No current facility-administered medications for this encounter.   Allergies  Allergen Reactions   Acetaminophen  Nausea Only   Social History   Socioeconomic History   Marital status: Divorced    Spouse name: Not on file   Number of children: Not on file   Years of education: Not on file   Highest education level: GED or equivalent  Occupational History   Not on file  Tobacco Use   Smoking status: Former    Current packs/day: 0.00    Average packs/day: 1 pack/day for 15.0 years (15.0 ttl pk-yrs)    Types: Cigarettes, E-cigarettes    Quit date: 07/21/2013    Years since quitting: 10.7   Smokeless tobacco: Never   Tobacco comments:    Quit  when I was told I had copd  Vaping Use   Vaping status: Never Used  Substance and Sexual Activity   Alcohol use: Not Currently    Comment: Never drank more than a drink with dinner maybe 20 times   Drug use: Not Currently    Types: Cocaine, Fentanyl , Heroin, Hydrocodone, Ketamine, Marijuana, MDMA (Ecstacy), Methamphetamines, Morphine, Oxycodone , Other-see comments    Comment: These are from over 20 years ago.   Sexual activity: Not Currently    Birth control/protection: Abstinence, Condom, None  Other Topics Concern   Not on file  Social History Narrative   Not on file   Social Drivers of Health   Financial Resource Strain: Medium Risk (10/16/2023)   Overall Financial Resource Strain (CARDIA)  Difficulty of Paying Living Expenses: Somewhat hard  Food Insecurity: Food Insecurity Present (03/24/2024)   Hunger Vital Sign    Worried About Running Out of Food in the Last Year: Sometimes true    Ran Out of Food in the Last Year: Sometimes true  Transportation Needs: Unmet Transportation Needs (03/24/2024)   PRAPARE - Administrator, Civil Service (Medical): Yes    Lack of Transportation (Non-Medical): Yes  Physical Activity: Unknown (10/16/2023)   Exercise Vital Sign    Days of Exercise per Week: 0 days    Minutes of Exercise per Session: Not on file  Stress: Stress Concern Present (10/16/2023)   [employer] of Occupational Health - Occupational Stress Questionnaire    Feeling of Stress : Very much  Social Connections: Socially Isolated (10/16/2023)   Social Connection and Isolation Panel    Frequency of Communication with Friends and Family: Once a week    Frequency of Social Gatherings with Friends and Family: Never    Attends Religious Services: Never    Database administrator or Organizations: No    Attends Engineer, structural: Not on file    Marital Status: Divorced  Intimate Partner Violence: Not At Risk (03/24/2024)   Humiliation, Afraid, Rape, and  Kick questionnaire    Fear of Current or Ex-Partner: No    Emotionally Abused: No    Physically Abused: No    Sexually Abused: No   Family History  Problem Relation Age of Onset   COPD Mother    Heart disease Mother    Arthritis Mother    Depression Mother    Obesity Mother    Heart attack Mother    Heart attack Father    Stroke Father    Alcohol abuse Father    Drug abuse Father    Bone cancer Maternal Grandmother    Arthritis Maternal Grandmother    Diabetes Maternal Grandmother    Early death Maternal Grandmother    Obesity Maternal Grandmother    Varicose Veins Maternal Grandmother    Stomach cancer Maternal Grandfather    Cancer Maternal Grandfather    COPD Maternal Grandfather    ADD / ADHD Daughter    Alcohol abuse Brother    Depression Brother    Learning disabilities Brother    Obesity Brother    Asthma Sister    Depression Sister    Drug abuse Sister    Miscarriages / Stillbirths Sister    Obesity Sister    Cancer Maternal Uncle    Early death Maternal Uncle    Depression Maternal Aunt    Drug abuse Maternal Aunt    Miscarriages / Stillbirths Maternal Aunt    BP 118/68   Pulse (!) 103   Ht 6' 1 (1.854 m)   Wt (!) 137 kg (302 lb)   SpO2 96%   BMI 39.84 kg/m   Wt Readings from Last 3 Encounters:  04/30/24 (!) 137 kg (302 lb)  03/25/24 134.4 kg (296 lb 6.4 oz)  03/21/24 131.5 kg (290 lb)   PHYSICAL EXAM: General: Well appearing. No distress on RA Cardiac: JVP difficult to assess, thick neck. S1 and S2 present.  Abdomen: Soft, non-tender, distended.  Extremities: Warm and dry.  3+ BLE edema.  Neuro: Alert and oriented x3. Affect pleasant.  ReDs reading: 31 %, normal  ASSESSMENT & PLAN: 1. Chronic Systolic CHF, NICM  - Diagnosed February 2021 while admitted for HF in [place] . EF 10% at that time. EF  has been severely reduced since that time.  - ?Etiology previous substance abuse and LBBB. He seems to be on track with his medications.  - s/p  MDT CRT-D - Cath 4/24: Normal cors. EF < 20% RA 8 PA 57/32 (42) PCW 17 Fick 5.5/2.2 PVR 4.5 - CPX 5/24 pVO2: 19.4 (70% predicted peak VO2) - corrected for ibw 28.3 ml/kg/min. VE/VCO2  37  pRER: 1.09  - RHC 1/25 - RA 12, PCWP 19, CO 4.5, CI 1.8.  - NYHA III. Volume up on exam, ReDs 31%. Significant BLE, has compression socks, instructed to wear daily. - Has missed last 2-3 doses Torsemide , out of KCL. BMET/BNP today - Continue Torsemide  40 mg bid. Metolazone  2.5 mg weekly ordered, however had a quantity of 5 filled in 5/25. - Continue KCL 40 mEq bid + additional 40 mEq with weekly metolazone  - Continue digoxin  0.125 mg daily.  - Continue carvedilol  6.25 mg bid.  - Continue spironolactone  25 mg daily. - Continue entresto  24/26 mg bid. - Continue jardiance  10 mg daily.  2. LBBB - QRS 176 msec on ECG 10/30/23 - now s/p MDT CRT-D - seen for wound check. Enrolled in iCM.  3. HTN - Controlled continue current regimen.  - Meds as above   4. H/O Polysubstance abuse - Hx heroin and meth abuse.  - UDS + for amphetamines during 2023 admission - He has not used in the last couple of years.   5. Severe OSA  Moderate CSA  Nocturnal Hypoxia - by sleep study 03/21/24 - needs to get BiPAP mask. Will reach out to Dr. Shlomo  Follow up 3 months with Dr. Bensimhon  [place] Lakesa Coste, NP  04/30/24

## 2024-04-30 NOTE — Telephone Encounter (Signed)
 Patient came in for appointment today and was very clear that he wanted to talk to Bensimhon and only him about a personal issue he did not want to give details on and to follow up about his sleep study and his Medtronic device he just had implanted. Please advise said it was okay if you just called his cellphone

## 2024-04-30 NOTE — Patient Instructions (Addendum)
 Thank you for coming in today  If you had labs drawn today, any labs that are abnormal the clinic will call you No news is good news  Please wear your compression hose daily, place them on as soon as you get up in the morning and remove before you go to bed at night.  Medications: No changes  Follow up appointments:  Your physician recommends that you schedule a follow-up appointment in:  3 months With Dr. Cherrie Please call our office to schedule the follow-up appointment in September for November 2025 .    Do the following things EVERYDAY: Weigh yourself in the morning before breakfast. Write it down and keep it in a log. Take your medicines as prescribed Eat low salt foods--Limit salt (sodium) to 2000 mg per day.  Stay as active as you can everyday Limit all fluids for the day to less than 2 liters   At the [facility], you and your health needs are our priority. As part of our continuing mission to provide you with exceptional heart care, we have created designated Provider Care Teams. These Care Teams include your primary Cardiologist (physician) and Advanced Practice Providers (APPs- Physician Assistants and Nurse Practitioners) who all work together to provide you with the care you need, when you need it.   You may see any of the following providers on your designated Care Team at your next follow up: Dr Toribio Cameron Jenkins Dr Ezra Shuck Dr. Ria Gardenia Greig Lenetta, NP Caffie Shed, [place] [facility] [place], [place] Beckey Coe, NP Tinnie Redman, PharmD   Please be sure to bring in all your medications bottles to every appointment.    Thank you for choosing [facility]  If you have any questions or concerns before your next appointment please send us  a message through [place] or call our office at [phone].    TO LEAVE A MESSAGE FOR THE NURSE SELECT OPTION 2, PLEASE LEAVE A MESSAGE  INCLUDING: YOUR NAME DATE OF BIRTH CALL BACK NUMBER REASON FOR CALL**this is important as we prioritize the call backs  YOU WILL RECEIVE A CALL BACK THE SAME DAY AS LONG AS YOU CALL BEFORE 4:00 PM

## 2024-05-05 ENCOUNTER — Telehealth: Payer: Self-pay | Admitting: *Deleted

## 2024-05-05 DIAGNOSIS — G4733 Obstructive sleep apnea (adult) (pediatric): Secondary | ICD-10-CM

## 2024-05-05 DIAGNOSIS — I5022 Chronic systolic (congestive) heart failure: Secondary | ICD-10-CM

## 2024-05-05 NOTE — Telephone Encounter (Signed)
-----   Message from [name] sent at 04/05/2024 10:35 PM EDT ----- Please let patient know that they have sleep apnea with successful PAP titration.  PAP ordered placed in Epic.  Followup in 6 weeks after starting PAP therapy.  Needs ONO on BiPAP

## 2024-05-05 NOTE — Telephone Encounter (Signed)
 The patient has been notified of the result and verbalized understanding.  All questions (if any) were answered. Cameron Jenkins, CMA 05/05/2024 12:34 PM     Upon patient request DME selection is [facility]. Patient understands he will be contacted by [facility] to set up his cpap. Patient understands to call if [facility] does not contact him with new setup in a timely manner. Patient understands they will be called once confirmation has been received from [facility]/ that they have received their new machine to schedule 10 week follow up appointment.   [facility] notified of new cpap order  Please add to airview Patient was grateful for the call and thanked me.

## 2024-05-12 ENCOUNTER — Other Ambulatory Visit (HOSPITAL_COMMUNITY): Payer: Self-pay

## 2024-05-18 ENCOUNTER — Other Ambulatory Visit (HOSPITAL_COMMUNITY): Payer: Self-pay

## 2024-05-18 ENCOUNTER — Other Ambulatory Visit: Payer: Self-pay

## 2024-06-28 ENCOUNTER — Other Ambulatory Visit (HOSPITAL_COMMUNITY): Payer: Self-pay

## 2024-06-28 ENCOUNTER — Other Ambulatory Visit: Payer: Self-pay

## 2024-06-29 ENCOUNTER — Encounter: Payer: MEDICAID | Admitting: Physician Assistant

## 2024-06-29 ENCOUNTER — Ambulatory Visit: Payer: MEDICAID | Attending: Physician Assistant | Admitting: Physician Assistant

## 2024-06-29 NOTE — Progress Notes (Deleted)
  Electrophysiology Office Note:   ID:  Cameron Jenkins, DOB 1975/11/05, MRN [id number]  Primary Cardiologist: None Electrophysiologist: Fonda Kitty, MD  {Click to update primary MD,subspecialty MD or APP then REFRESH:1}    History of Present Illness:   Cameron Jenkins is a 48 y.o. male with h/o chronic systolic heart failure secondary due to presumed NICM, LBBB, HTN, OSA, hx COPD, anxiety, prior tobacco use and polysubstance abuse seen today for routine electrophysiology follow-up s/p Defibrillator implant.  Patient with longstanding CHF, previously followed by cardiology in [place] . Considered for CRT-D in 2022 but ultimately did not have this placed 2/2 housing/financial challenges. Since moving to [place], patient's social situation has improved and he now follows regularly with AHF clinic. Patient referred to EP for CRT-D given EF <30% on optimal GDMT as well as LBBB with QRS >17ms. Device implant originally planned for February but this was postponed after patient became ill with influenza, had prolonged recovery. Device finally implanted on 03/24/24.   Since ICD implant, the patient reports doing ***.  he denies chest pain, palpitations, dyspnea, PND, orthopnea, nausea, vomiting, dizziness, syncope, edema, weight gain, or early satiety.    Review of systems complete and found to be negative unless listed in HPI.   EP Information / Studies Reviewed:    {EKGtoday:28818}       ICD Interrogation-  reviewed in detail today,  See PACEART report.  Arrhythmia/Device History [facility] MD: Dr. Bensimhon. ReDs 06/03/23: 40%   Physical Exam:   VS:  There were no vitals taken for this visit.   Wt Readings from Last 3 Encounters:  04/30/24 (!) 302 lb (137 kg)  03/25/24 296 lb 6.4 oz (134.4 kg)  03/21/24 290 lb (131.5 kg)     GEN: No acute distress *** NECK: No JVD; No carotid bruits CARDIAC: {EPRHYTHM:28826}, no murmurs, rubs, gallops RESPIRATORY:  Clear to auscultation without rales,  wheezing or rhonchi  ABDOMEN: Soft, non-tender, non-distended EXTREMITIES:  {EDEMA LEVEL:28147::No} edema; No deformity   ASSESSMENT AND PLAN:    Chronic systolic CHF  s/p Medtronic CRT-D  euvolemic today Stable on an appropriate medical regimen Normal ICD function See Pace Art report No changes today  Continue to follow with Dr. Bensimhon and take regimen of carvedilol  6.25mg  BID, empagliflozin  10mg  dialy, Entresto  24-26mg  BID, spironolactone  25mg .  Disposition:   Follow up with Dr. Kitty in 12 months   Signed, Artist Pouch, PA-C

## 2024-07-01 ENCOUNTER — Encounter: Payer: Self-pay | Admitting: Physician Assistant

## 2024-07-01 NOTE — Progress Notes (Deleted)
  Electrophysiology Office Note:   ID:  Cameron Jenkins, DOB 1976-02-25, MRN [id number]  Primary Cardiologist: None Electrophysiologist: Fonda Kitty, MD  {Click to update primary MD,subspecialty MD or APP then REFRESH:1}    History of Present Illness:   Cameron Jenkins is a 48 y.o. male with h/o chronic systolic heart failure secondary due to presumed NICM, LBBB, HTN, OSA, hx COPD, anxiety, prior tobacco use and polysubstance abuse seen today for routine electrophysiology follow-up s/p Defibrillator implant.  Patient with longstanding CHF, previously followed by cardiology in [place] . Considered for CRT-D in 2022 but ultimately did not have this placed 2/2 housing/financial challenges. Since moving to [place], patient's social situation has improved and he now follows regularly with AHF clinic. Patient referred to EP for CRT-D given EF <30% on optimal GDMT as well as LBBB with QRS >162ms. Device implant originally planned for February but this was postponed after patient became ill with influenza, had prolonged recovery. Device finally implanted on 03/24/24.   Since ICD implant, the patient reports doing ***.  he denies chest pain, palpitations, dyspnea, PND, orthopnea, nausea, vomiting, dizziness, syncope, edema, weight gain, or early satiety.    Review of systems complete and found to be negative unless listed in HPI.   EP Information / Studies Reviewed:    EKG is ordered today. Personal review as below.       ICD Interrogation-  reviewed in detail today,  See PACEART report.  Arrhythmia/Device History [facility] A[facility] MD: Dr. Bensimhon. ReDs 06/03/23: 40% Medtronic Cobalt XT DR [id number] with Medtronic CapSureFix Novus [id number] right atrial lead and Medtronic Sprint Quattro Secure [id number] right ventricular lead Medtronic SelectSure 3830 in the Left bundle position.   Physical Exam:   VS:  There were no vitals taken for this visit.   Wt Readings from Last 3 Encounters:  04/30/24 (!) 302 lb (137 kg)   03/25/24 296 lb 6.4 oz (134.4 kg)  03/21/24 290 lb (131.5 kg)     GEN: No acute distress *** NECK: No JVD; No carotid bruits CARDIAC: {EPRHYTHM:28826}, no murmurs, rubs, gallops RESPIRATORY:  Clear to auscultation without rales, wheezing or rhonchi  ABDOMEN: Soft, non-tender, non-distended EXTREMITIES:  {EDEMA LEVEL:28147::No} edema; No deformity   ASSESSMENT AND PLAN:    Chronic systolic CHF due to NICM s/p Medtronic CRT-D  -euvolemic on exam / by device*** -Stable on an appropriate medical regimen / follows with Dr. Bensimhon -Normal ICD function -See Pace Art report -No changes today  ***Continue to follow with Dr. Bensimhon and take regimen of carvedilol  6.25mg  BID, empagliflozin  10mg  dialy, Entresto  24-26mg  BID, spironolactone  25mg .  Disposition:   Follow up with Dr. Kitty in 12 months   Signed, Daphne Barrack, NP-C, AGACNP-BC [facility] - Electrophysiology  07/01/2024, 6:41 PM

## 2024-07-03 ENCOUNTER — Emergency Department (HOSPITAL_COMMUNITY): Payer: MEDICAID

## 2024-07-03 ENCOUNTER — Other Ambulatory Visit: Payer: Self-pay

## 2024-07-03 ENCOUNTER — Encounter (HOSPITAL_COMMUNITY): Payer: Self-pay

## 2024-07-03 ENCOUNTER — Inpatient Hospital Stay (HOSPITAL_COMMUNITY)
Admission: EM | Admit: 2024-07-03 | Discharge: 2024-07-05 | DRG: 871 | Discharge status: Against Medical Advice | Payer: MEDICAID | Attending: Internal Medicine | Admitting: Internal Medicine

## 2024-07-03 DIAGNOSIS — J9621 Acute and chronic respiratory failure with hypoxia: Secondary | ICD-10-CM | POA: Diagnosis present

## 2024-07-03 DIAGNOSIS — Z6841 Body Mass Index (BMI) 40.0 and over, adult: Secondary | ICD-10-CM

## 2024-07-03 DIAGNOSIS — T402X1A Poisoning by other opioids, accidental (unintentional), initial encounter: Secondary | ICD-10-CM | POA: Diagnosis not present

## 2024-07-03 DIAGNOSIS — Z5941 Food insecurity: Secondary | ICD-10-CM

## 2024-07-03 DIAGNOSIS — Z9981 Dependence on supplemental oxygen: Secondary | ICD-10-CM | POA: Diagnosis not present

## 2024-07-03 DIAGNOSIS — Z8674 Personal history of sudden cardiac arrest: Secondary | ICD-10-CM

## 2024-07-03 DIAGNOSIS — E872 Acidosis, unspecified: Secondary | ICD-10-CM | POA: Diagnosis present

## 2024-07-03 DIAGNOSIS — I420 Dilated cardiomyopathy: Secondary | ICD-10-CM | POA: Diagnosis present

## 2024-07-03 DIAGNOSIS — R6521 Severe sepsis with septic shock: Secondary | ICD-10-CM | POA: Diagnosis present

## 2024-07-03 DIAGNOSIS — I110 Hypertensive heart disease with heart failure: Secondary | ICD-10-CM | POA: Diagnosis present

## 2024-07-03 DIAGNOSIS — I5082 Biventricular heart failure: Secondary | ICD-10-CM | POA: Diagnosis present

## 2024-07-03 DIAGNOSIS — I272 Pulmonary hypertension, unspecified: Secondary | ICD-10-CM | POA: Diagnosis present

## 2024-07-03 DIAGNOSIS — I447 Left bundle-branch block, unspecified: Secondary | ICD-10-CM | POA: Diagnosis present

## 2024-07-03 DIAGNOSIS — F1910 Other psychoactive substance abuse, uncomplicated: Secondary | ICD-10-CM | POA: Diagnosis present

## 2024-07-03 DIAGNOSIS — E1165 Type 2 diabetes mellitus with hyperglycemia: Secondary | ICD-10-CM | POA: Diagnosis present

## 2024-07-03 DIAGNOSIS — Z7984 Long term (current) use of oral hypoglycemic drugs: Secondary | ICD-10-CM

## 2024-07-03 DIAGNOSIS — Z5329 Procedure and treatment not carried out because of patient's decision for other reasons: Secondary | ICD-10-CM | POA: Diagnosis not present

## 2024-07-03 DIAGNOSIS — I509 Heart failure, unspecified: Secondary | ICD-10-CM | POA: Diagnosis not present

## 2024-07-03 DIAGNOSIS — J189 Pneumonia, unspecified organism: Principal | ICD-10-CM | POA: Diagnosis present

## 2024-07-03 DIAGNOSIS — I5023 Acute on chronic systolic (congestive) heart failure: Secondary | ICD-10-CM | POA: Diagnosis not present

## 2024-07-03 DIAGNOSIS — Z8701 Personal history of pneumonia (recurrent): Secondary | ICD-10-CM

## 2024-07-03 DIAGNOSIS — R6520 Severe sepsis without septic shock: Secondary | ICD-10-CM | POA: Diagnosis present

## 2024-07-03 DIAGNOSIS — Z59868 Other specified financial insecurity: Secondary | ICD-10-CM

## 2024-07-03 DIAGNOSIS — J9600 Acute respiratory failure, unspecified whether with hypoxia or hypercapnia: Secondary | ICD-10-CM | POA: Diagnosis not present

## 2024-07-03 DIAGNOSIS — M4640 Discitis, unspecified, site unspecified: Secondary | ICD-10-CM | POA: Diagnosis present

## 2024-07-03 DIAGNOSIS — J9611 Chronic respiratory failure with hypoxia: Secondary | ICD-10-CM

## 2024-07-03 DIAGNOSIS — J440 Chronic obstructive pulmonary disease with acute lower respiratory infection: Secondary | ICD-10-CM | POA: Diagnosis present

## 2024-07-03 DIAGNOSIS — I2489 Other forms of acute ischemic heart disease: Secondary | ICD-10-CM | POA: Diagnosis present

## 2024-07-03 DIAGNOSIS — M48061 Spinal stenosis, lumbar region without neurogenic claudication: Secondary | ICD-10-CM | POA: Diagnosis present

## 2024-07-03 DIAGNOSIS — G4733 Obstructive sleep apnea (adult) (pediatric): Secondary | ICD-10-CM | POA: Diagnosis not present

## 2024-07-03 DIAGNOSIS — G9341 Metabolic encephalopathy: Secondary | ICD-10-CM | POA: Diagnosis present

## 2024-07-03 DIAGNOSIS — Z886 Allergy status to analgesic agent status: Secondary | ICD-10-CM

## 2024-07-03 DIAGNOSIS — Z5982 Transportation insecurity: Secondary | ICD-10-CM

## 2024-07-03 DIAGNOSIS — G8929 Other chronic pain: Secondary | ICD-10-CM | POA: Diagnosis present

## 2024-07-03 DIAGNOSIS — Z95810 Presence of automatic (implantable) cardiac defibrillator: Secondary | ICD-10-CM

## 2024-07-03 DIAGNOSIS — Z833 Family history of diabetes mellitus: Secondary | ICD-10-CM

## 2024-07-03 DIAGNOSIS — A419 Sepsis, unspecified organism: Secondary | ICD-10-CM | POA: Diagnosis present

## 2024-07-03 DIAGNOSIS — Z91199 Patient's noncompliance with other medical treatment and regimen due to unspecified reason: Secondary | ICD-10-CM

## 2024-07-03 DIAGNOSIS — R000 Tachycardia, unspecified: Secondary | ICD-10-CM | POA: Diagnosis not present

## 2024-07-03 DIAGNOSIS — E861 Hypovolemia: Secondary | ICD-10-CM | POA: Diagnosis present

## 2024-07-03 DIAGNOSIS — Z87891 Personal history of nicotine dependence: Secondary | ICD-10-CM

## 2024-07-03 DIAGNOSIS — Z7951 Long term (current) use of inhaled steroids: Secondary | ICD-10-CM | POA: Diagnosis not present

## 2024-07-03 DIAGNOSIS — Z604 Social exclusion and rejection: Secondary | ICD-10-CM | POA: Diagnosis present

## 2024-07-03 DIAGNOSIS — I5043 Acute on chronic combined systolic (congestive) and diastolic (congestive) heart failure: Secondary | ICD-10-CM | POA: Diagnosis present

## 2024-07-03 DIAGNOSIS — G4731 Primary central sleep apnea: Secondary | ICD-10-CM | POA: Diagnosis present

## 2024-07-03 DIAGNOSIS — I517 Cardiomegaly: Secondary | ICD-10-CM | POA: Diagnosis not present

## 2024-07-03 DIAGNOSIS — Z8249 Family history of ischemic heart disease and other diseases of the circulatory system: Secondary | ICD-10-CM

## 2024-07-03 DIAGNOSIS — F419 Anxiety disorder, unspecified: Secondary | ICD-10-CM | POA: Diagnosis present

## 2024-07-03 LAB — COMPREHENSIVE METABOLIC PANEL WITH GFR
ALT: 37 U/L (ref 0–44)
AST: 52 U/L — ABNORMAL HIGH (ref 15–41)
Albumin: 3 g/dL — ABNORMAL LOW (ref 3.5–5.0)
Alkaline Phosphatase: 64 U/L (ref 38–126)
Anion gap: 12 (ref 5–15)
BUN: 13 mg/dL (ref 6–20)
CO2: 25 mmol/L (ref 22–32)
Calcium: 8.7 mg/dL — ABNORMAL LOW (ref 8.9–10.3)
Chloride: 95 mmol/L — ABNORMAL LOW (ref 98–111)
Creatinine, Ser: 1.17 mg/dL (ref 0.61–1.24)
GFR, Estimated: >60 mL/min (ref >60)
Glucose, Bld: 279 mg/dL — ABNORMAL HIGH (ref 70–99)
Potassium: 3.8 mmol/L (ref 3.5–5.1)
Sodium: 132 mmol/L — ABNORMAL LOW (ref 135–145)
Total Bilirubin: 1.1 mg/dL (ref 0.0–1.2)
Total Protein: 7.1 g/dL (ref 6.5–8.1)

## 2024-07-03 LAB — CBC WITH DIFFERENTIAL/PLATELET
Abs Immature Granulocytes: 0.06 K/uL (ref 0.00–0.07)
Basophils Absolute: 0 K/uL (ref 0.0–0.1)
Basophils Relative: 0 %
Eosinophils Absolute: 0 K/uL (ref 0.0–0.5)
Eosinophils Relative: 0 %
HCT: 43.6 % (ref 39.0–52.0)
Hemoglobin: 14.2 g/dL (ref 13.0–17.0)
Immature Granulocytes: 1 %
Lymphocytes Relative: 3 %
Lymphs Abs: 0.3 K/uL — ABNORMAL LOW (ref 0.7–4.0)
MCH: 30 pg (ref 26.0–34.0)
MCHC: 32.6 g/dL (ref 30.0–36.0)
MCV: 92.2 fL (ref 80.0–100.0)
Monocytes Absolute: 0.6 K/uL (ref 0.1–1.0)
Monocytes Relative: 6 %
Neutro Abs: 9.3 K/uL — ABNORMAL HIGH (ref 1.7–7.7)
Neutrophils Relative %: 90 %
Platelets: 131 K/uL — ABNORMAL LOW (ref 150–400)
RBC: 4.73 MIL/uL (ref 4.22–5.81)
RDW: 14.2 % (ref 11.5–15.5)
WBC: 10.3 K/uL (ref 4.0–10.5)
nRBC: 0 % (ref 0.0–0.2)

## 2024-07-03 LAB — PROTIME-INR
INR: 1.4 — ABNORMAL HIGH (ref 0.8–1.2)
Prothrombin Time: 17.5 s — ABNORMAL HIGH (ref 11.4–15.2)

## 2024-07-03 LAB — TROPONIN I (HIGH SENSITIVITY)
Troponin I (High Sensitivity): 125 ng/L (ref <18)
Troponin I (High Sensitivity): 147 ng/L (ref <18)

## 2024-07-03 LAB — I-STAT CHEM 8, ED
BUN: 15 mg/dL (ref 6–20)
Calcium, Ion: 1.1 mmol/L — ABNORMAL LOW (ref 1.15–1.40)
Chloride: 93 mmol/L — ABNORMAL LOW (ref 98–111)
Creatinine, Ser: 1.1 mg/dL (ref 0.61–1.24)
Glucose, Bld: 218 mg/dL — ABNORMAL HIGH (ref 70–99)
HCT: 43 % (ref 39.0–52.0)
Hemoglobin: 14.6 g/dL (ref 13.0–17.0)
Potassium: 3.7 mmol/L (ref 3.5–5.1)
Sodium: 133 mmol/L — ABNORMAL LOW (ref 135–145)
TCO2: 27 mmol/L (ref 22–32)

## 2024-07-03 LAB — RESP PANEL BY RT-PCR (RSV, FLU A&B, COVID)  RVPGX2
Influenza A by PCR: NEGATIVE
Influenza B by PCR: NEGATIVE
Resp Syncytial Virus by PCR: NEGATIVE
SARS Coronavirus 2 by RT PCR: NEGATIVE

## 2024-07-03 LAB — CBG MONITORING, ED
Glucose-Capillary: 170 mg/dL — ABNORMAL HIGH (ref 70–99)
Glucose-Capillary: 204 mg/dL — ABNORMAL HIGH (ref 70–99)

## 2024-07-03 LAB — MAGNESIUM: Magnesium: 1.7 mg/dL (ref 1.7–2.4)

## 2024-07-03 LAB — ETHANOL: Alcohol, Ethyl (B): <15 mg/dL (ref <15)

## 2024-07-03 LAB — DIGOXIN LEVEL: Digoxin Level: <0.6 ng/mL — ABNORMAL LOW (ref 0.8–2.0)

## 2024-07-03 LAB — HEMOGLOBIN A1C
Hgb A1c MFr Bld: 6.7 % — ABNORMAL HIGH (ref 4.8–5.6)
Mean Plasma Glucose: 145.59 mg/dL

## 2024-07-03 LAB — I-STAT CG4 LACTIC ACID, ED
Lactic Acid, Venous: 5.4 mmol/L (ref 0.5–1.9)
Lactic Acid, Venous: 1.4 mmol/L (ref 0.5–1.9)
Lactic Acid, Venous: 0.9 mmol/L (ref 0.5–1.9)

## 2024-07-03 LAB — CK: Total CK: 96 U/L (ref 49–397)

## 2024-07-03 MED ORDER — SODIUM CHLORIDE 0.9 % IV SOLN: 250.0000 mL | INTRAVENOUS | Status: AC

## 2024-07-03 MED ORDER — FENTANYL CITRATE (PF) 50 MCG/ML IJ SOSY
50.0000 ug | PREFILLED_SYRINGE | Freq: Once | INTRAMUSCULAR | Status: AC
  Administered 2024-07-03: 50 ug via INTRAVENOUS
  Filled 2024-07-03: qty 1

## 2024-07-03 MED ORDER — SODIUM CHLORIDE 0.9 % IV SOLN
2.0000 g | Freq: Once | INTRAVENOUS | Status: AC
  Administered 2024-07-03: 2 g via INTRAVENOUS
  Filled 2024-07-03: qty 12.5

## 2024-07-03 MED ORDER — SODIUM CHLORIDE 0.9 % IV BOLUS
500.0000 mL | Freq: Once | INTRAVENOUS | Status: AC
  Administered 2024-07-03: 500 mL via INTRAVENOUS

## 2024-07-03 MED ORDER — HEPARIN SODIUM (PORCINE) 5000 UNIT/ML IJ SOLN
5000.0000 [IU] | Freq: Two times a day (BID) | INTRAMUSCULAR | Status: DC
  Administered 2024-07-03 – 2024-07-05 (×4): 5000 [IU] via SUBCUTANEOUS
  Filled 2024-07-03 (×4): qty 1

## 2024-07-03 MED ORDER — IOHEXOL 350 MG/ML SOLN
100.0000 mL | Freq: Once | INTRAVENOUS | Status: AC | PRN
  Administered 2024-07-03: 100 mL via INTRAVENOUS

## 2024-07-03 MED ORDER — HYDROMORPHONE HCL 1 MG/ML IJ SOLN
1.0000 mg | Freq: Once | INTRAMUSCULAR | Status: AC
  Administered 2024-07-03: 1 mg via INTRAVENOUS
  Filled 2024-07-03: qty 1

## 2024-07-03 MED ORDER — FENTANYL CITRATE (PF) 50 MCG/ML IJ SOSY
25.0000 ug | PREFILLED_SYRINGE | Freq: Once | INTRAMUSCULAR | Status: AC
  Administered 2024-07-03: 25 ug via INTRAVENOUS
  Filled 2024-07-03: qty 1

## 2024-07-03 MED ORDER — PIPERACILLIN-TAZOBACTAM 3.375 G IVPB
3.3750 g | Freq: Three times a day (TID) | INTRAVENOUS | Status: DC
  Administered 2024-07-03 – 2024-07-05 (×6): 3.375 g via INTRAVENOUS
  Filled 2024-07-03 (×8): qty 50

## 2024-07-03 MED ORDER — MIDODRINE HCL 5 MG PO TABS
10.0000 mg | ORAL_TABLET | ORAL | Status: AC
  Administered 2024-07-03: 10 mg via ORAL
  Filled 2024-07-03: qty 2

## 2024-07-03 MED ORDER — VANCOMYCIN HCL 2000 MG/400ML IV SOLN
2000.0000 mg | Freq: Once | INTRAVENOUS | Status: AC
  Administered 2024-07-03: 2000 mg via INTRAVENOUS
  Filled 2024-07-03: qty 400

## 2024-07-03 MED ORDER — NOREPINEPHRINE 4 MG/250ML-% IV SOLN
0.0000 ug/min | INTRAVENOUS | Status: DC
  Administered 2024-07-03: 2 ug/min via INTRAVENOUS
  Filled 2024-07-03: qty 250

## 2024-07-03 MED ORDER — INSULIN ASPART 100 UNIT/ML IJ SOLN
0.0000 [IU] | INTRAMUSCULAR | Status: DC
  Administered 2024-07-03: 5 [IU] via SUBCUTANEOUS
  Administered 2024-07-03 – 2024-07-04 (×3): 3 [IU] via SUBCUTANEOUS
  Administered 2024-07-04 (×3): 2 [IU] via SUBCUTANEOUS
  Administered 2024-07-05: 3 [IU] via SUBCUTANEOUS

## 2024-07-03 MED ORDER — LACTATED RINGERS IV SOLN: INTRAVENOUS | Status: AC

## 2024-07-03 MED ORDER — ALBUTEROL SULFATE (2.5 MG/3ML) 0.083% IN NEBU
2.5000 mg | INHALATION_SOLUTION | Freq: Four times a day (QID) | RESPIRATORY_TRACT | Status: DC
Start: 2024-07-03 — End: 2024-07-05
  Administered 2024-07-03 – 2024-07-05 (×4): 2.5 mg via RESPIRATORY_TRACT
  Filled 2024-07-03 (×5): qty 3

## 2024-07-03 MED ORDER — LACTATED RINGERS IV BOLUS (SEPSIS)
500.0000 mL | Freq: Once | INTRAVENOUS | Status: AC
  Administered 2024-07-03: 500 mL via INTRAVENOUS

## 2024-07-03 MED ORDER — KETOROLAC TROMETHAMINE 15 MG/ML IJ SOLN
15.0000 mg | Freq: Once | INTRAMUSCULAR | Status: AC
  Administered 2024-07-03: 15 mg via INTRAVENOUS
  Filled 2024-07-03: qty 1

## 2024-07-03 MED ORDER — VANCOMYCIN HCL IN DEXTROSE 1-5 GM/200ML-% IV SOLN: 1000.0000 mg | Freq: Once | INTRAVENOUS | Status: DC

## 2024-07-03 MED ORDER — PANTOPRAZOLE SODIUM 40 MG IV SOLR
40.0000 mg | Freq: Two times a day (BID) | INTRAVENOUS | Status: DC
  Administered 2024-07-03 – 2024-07-05 (×4): 40 mg via INTRAVENOUS
  Filled 2024-07-03 (×4): qty 10

## 2024-07-03 MED ORDER — VANCOMYCIN HCL 1500 MG/300ML IV SOLN
1500.0000 mg | Freq: Two times a day (BID) | INTRAVENOUS | Status: DC
  Administered 2024-07-04 – 2024-07-05 (×4): 1500 mg via INTRAVENOUS
  Filled 2024-07-03 (×6): qty 300

## 2024-07-03 MED ORDER — LACTATED RINGERS IV BOLUS
2000.0000 mL | Freq: Once | INTRAVENOUS | Status: AC
  Administered 2024-07-03: 2000 mL via INTRAVENOUS

## 2024-07-03 MED ORDER — MORPHINE SULFATE (PF) 2 MG/ML IV SOLN
2.0000 mg | INTRAVENOUS | Status: DC | PRN
  Administered 2024-07-04: 2 mg via INTRAVENOUS
  Filled 2024-07-03: qty 1

## 2024-07-03 NOTE — ED Provider Notes (Signed)
.  Critical Care  Performed by: Cottie Donnice PARAS, MD Authorized by: Cottie Donnice PARAS, MD   Critical care provider statement:    Critical care time (minutes):  30   Critical care time was exclusive of:  Separately billable procedures and treating other patients   Critical care was necessary to treat or prevent imminent or life-threatening deterioration of the following conditions:  Sepsis   Critical care was time spent personally by me on the following activities:  Ordering and performing treatments and interventions, ordering and review of laboratory studies, ordering and review of radiographic studies, pulse oximetry, review of old charts, examination of patient and evaluation of patient's response to treatment   Care discussed with: admitting provider   Comments:     Vasopressor for sepsis, shock     Petrice Beedy, Donnice PARAS, MD 07/03/24 1843

## 2024-07-03 NOTE — Consult Note (Addendum)
 Critical care consulted for admission to the ICU for septic shock.  Patient reports ongoing back pain and fevers for a week, cough and shortness of breath for 3 days.  He was treated with antibiotics steroid and cough suppressant yesterday.  He has history of nonischemic cardiomyopathy status post ICD placement, history of obstructive sleep apnea, reportedly started on CPAP recently, History of asthma. He uses oxygen at home Uses Symbicort  Takes diuretics at home, reports compliance Is a former smoker   He used to be a Naval architect Lives alone   At the time of my evaluation, he was on 1 of Levophed with MAP around 65.  We paused his Levophed for about 10 minutes.  His MAP stayed more than 65 and up to 70s, with his systolic blood pressure more than 90s Patient is receiving 150/h Ringer's lactate. He is CT chest earlier does not show evidence of pulmonary edema and he is not dyspneic.  He reports his dyspnea has improved compared to when he arrived to the ED There is a small area of ground glass opacity in the right upper lobe and CT chest  He does have several rashes in his skin, which he reports are chronic.     Today's Vitals   07/03/24 1816 07/03/24 1830 07/03/24 1845 07/03/24 1851  BP:  108/63 95/62 (!) 88/62  Pulse:  96 93 92  Resp:  20 12 (!) 8  Temp:      TempSrc:      SpO2:  98% 97% 97%  Weight:      Height:      PainSc: 3       Body mass index is 41.56 kg/m.  Assessment and plan Severe sepsis Likely discitis At risk for ICD infection Nonischemic cardiomyopathy Lactic acidosis at presentation-resolved now on recheck Chronic respiratory failure OSA on CPAP   -paused levophed and his MAP stayed in 65-70s.  - Would recommend continuing IV fluids for now.  Can be weaned down to 125/L later - Respiratory status is at baseline.  Recommend inhaled budesonide , formoterol  and yupelri nebulizer and as needed DuoNebs - Hold diuretics for now - Agree with  broad-spectrum antibiotics including vancomycin - Blood culture - Echo, back imaging   At this point, patient is off pressors and reports improvement since presentation.  Lactic acid has normalized. Patient is appropriate for floor admit.  If condition worsens, we can reassess and consider ICU admission   D/W Dr Tobie and Dr Cottie  Carter Helling, M.D. [facility] Pulmonary/Critical Care Medicine 07/03/2024 6:57 PM   Thank you for the opportunity to take part in the care of this patient

## 2024-07-03 NOTE — ED Provider Notes (Addendum)
 48 yo male w/ fever, cough, chest pain, SOB x 3-4 days Started on steroids and antibiotics from UC Here is febrile, tachycardic Trop 125 , repeat pending Lactate 5.4   Hx of nonischemic cardiomyopathy and Medtronic BiV ICD placed in July 2025 by Dr. Kennyth  [facility] 4/24 with normal coronary arteries  Pt ordered IV fluids, cefepime, toradol  for fever Now at CT for dissection study   Physical Exam  BP 102/72   Pulse (!) 104   Temp 98.5 F (36.9 C) (Oral)   Resp 15   Ht 6' 1 (1.854 m)   Wt (!) 142.9 kg   SpO2 97%   BMI 41.56 kg/m   Physical Exam  Procedures  Procedures  ED Course / MDM   Clinical Course as of 07/03/24 1842  Sat Jul 03, 2024  1516 Pt back from CT, RN hanging IV antibiotics and fluids now, pt still having pain in his mid back, I've ordered 1 mg IV dilaudid.   BP MAP > 65, HR 110  [MT]  1602 No acute dissection, CT imaging demonstrates likely PNA - pt needing repeat trop at 2 hour mark, and repeat lactate after fluids, RN made aware.   [MT]  1638 Lactic Acid, Venous: 1.4 [MT]  1638 Repeat lactate normalized, pt reporting significant improvement of pain.  Plan to admit [MT]  1717 Admitted to hospitalist [MT]  1732 Following admission patient's blood pressure was noted to drop now 70s over 40.  He is mentating well.  The hospitalist was requested ICU consult and admission which I think is quite reasonable.  I have ordered peripheral Levophed and have requested a page to the ICU. [MT]  1804 ICU consulted [MT]    Clinical Course User Index [MT] Coryn Mosso, Donnice PARAS, MD   Medical Decision Making Amount and/or Complexity of Data Reviewed Labs:  Decision-making details documented in ED Course. Radiology: ordered.  Risk Prescription drug management. Decision regarding hospitalization.   ICU attending has evaluated pt, felt MAP and BP stable for step-down; hospitalist Dr Tobie in agreement and will admit patient.     Cottie Donnice PARAS, MD 07/03/24 [id number]

## 2024-07-03 NOTE — H&P (Signed)
 History and Physical    Patient: Cameron Jenkins FMW:[id number] DOB: 1976-09-14 DOA: 07/03/2024 DOS: the patient was seen and examined on 07/03/2024 . PCP: Patient, No Pcp Per  Patient coming from: Home Chief complaint: Chief Complaint  Patient presents with   Shortness of Breath   HPI:  Cameron Jenkins is a 48 y.o. male with past medical history  of  chronic systolic CHF with EF 10% due to presumed NICM, LBBB, HTN, OSA, COPD, anxiety, prior tobacco use, and history polysubstance abuse presenting for shortness of breath along with chest pain.  Patient states he was given a shot and told that he has pneumonia.  But his chest pain is worse today and so he came in.  At bedside patient states he is having low back pain and his abdomen is hurting, site of pacemaker/ICD insertion seems to be healing well no drainage mild erythema at incision site.  He does have tenderness on exam.  Initially declined patient to PCCM as patient's blood pressure during exam at bedside showed systolic of 76 and recommended ICU admission as patient has HFrEF and needs perfusion patient's received 2-1/2 L of MIVF.  Patient evaluated by PCCM at bedside  Levophed was turned off 1835. off patient's systolic initially was 105 and advised patient can go to the floor.  ED Course:  Vital signs in the ED were notable for the following:  Vitals:   07/03/24 1851 07/03/24 1854 07/03/24 1900 07/03/24 1915  BP: (!) 88/62 (!) 87/51 102/67 97/77  Pulse: 92 (!) 56 98 95  Temp:      Resp: (!) 8 18 19  (!) 9  Height:      Weight:      SpO2: 97% 100% 97% 97%  TempSrc:      BMI (Calculated):       Intake/Output Summary (Last 24 hours) at 07/03/2024 1930 Last data filed at 07/03/2024 1740 Gross per 24 hour  Intake 1934.94 ml  Output --  Net 1934.94 ml    >>ED evaluation thus far shows: Initial CMP shows glucose of 279 AST of 52 otherwise normal kidney function normal electrolytes and normal GFR. Initial troponin of 120 5 repeat  at 147, EKG shows atrial sensed ventricular paced rhythm at 134 PR 138 QTc 448. CBC shows normal white count normal hemoglobin thrombocytopenia with a platelet count of 131. PT/INR of 17.5/1.4.   >>While in the ED patient received the following: Medications  lactated ringers infusion ( Intravenous New Bag/Given 07/03/24 1758)  0.9 %  sodium chloride  infusion (has no administration in time range)  norepinephrine (LEVOPHED) 4mg  in (0.016 mg/mL) premix infusion (2 mcg/min Intravenous New Bag/Given 07/03/24 1756)  lactated ringers bolus 500 mL (0 mLs Intravenous Stopped 07/03/24 1513)  ceFEPIme (MAXIPIME) 2 g in sodium chloride  0.9 % 100 mL IVPB (0 g Intravenous Stopped 07/03/24 1444)  vancomycin (VANCOREADY) IVPB 2000 mg/400 mL (0 mg Intravenous Stopped 07/03/24 1717)  fentaNYL  (SUBLIMAZE ) injection 25 mcg (25 mcg Intravenous Given 07/03/24 1430)  ketorolac  (TORADOL ) 15 MG/ML injection 15 mg (15 mg Intravenous Given 07/03/24 1440)  lactated ringers bolus 2,000 mL (0 mLs Intravenous Stopped 07/03/24 1740)  iohexol  (OMNIPAQUE ) 350 MG/ML injection 100 mL (100 mLs Intravenous Contrast Given 07/03/24 1512)  HYDROmorphone (DILAUDID) injection 1 mg (1 mg Intravenous Given 07/03/24 1520)  fentaNYL  (SUBLIMAZE ) injection 50 mcg (50 mcg Intravenous Given 07/03/24 1748)   Review of Systems  Constitutional:  Positive for chills and fever.  Respiratory:  Positive for shortness of breath.  Cardiovascular:  Positive for chest pain.  Musculoskeletal:  Positive for back pain.  Neurological:  Positive for weakness.   Past Medical History:  Diagnosis Date   Acute on chronic systolic congestive heart failure (HCC) 05/28/2020   Formatting of this note might be different from the original. IMO update 2021   Allergy    Since grade school age   Anemia    Not sure about this   Asthma 01/1992   Chest pain 05/28/2020   Congestive heart failure (CHF) (HCC) 07/21/2020   COPD (chronic obstructive  pulmonary disease) (HCC) 2014   Depression 1993   Dilated cardiomyopathy (HCC) 07/21/2020   Dyspnea on exertion 07/21/2020   Essential hypertension 05/28/2020   Generalized anxiety disorder 05/28/2020   GERD (gastroesophageal reflux disease) 1991   Heart failure (HCC)    Heart murmur Birth   LBBB (left bundle branch block) 05/28/2020   Nausea and vomiting 05/28/2020   Obstructive sleep apnea 07/21/2020   Pneumonia due to COVID-19 virus 05/28/2020   Formatting of this note might be different from the original. Diagnosed by SARS-CoV-2 PCR Cephid test at [facility] in [place], [place] on 05/28/2020.   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    Sleep apnea 2008   Substance abuse (HCC) 1996   Past Surgical History:  Procedure Laterality Date   BIV ICD INSERTION CRT-D N/A 03/24/2024   Procedure: BIV ICD INSERTION CRT-D;  Surgeon: Kennyth Chew, MD;  Location: [facility] INVASIVE CV LAB;  Service: Cardiovascular;  Laterality: N/A;   CARDIAC CATHETERIZATION  01/2023   NO PAST SURGERIES     RIGHT HEART CATH N/A 10/16/2023   Procedure: RIGHT HEART CATH;  Surgeon: Cherrie Toribio SAUNDERS, MD;  Location: [facility] INVASIVE CV LAB;  Service: Cardiovascular;  Laterality: N/A;   RIGHT/LEFT HEART CATH AND CORONARY ANGIOGRAPHY N/A 12/27/2022   Procedure: RIGHT/LEFT HEART CATH AND CORONARY ANGIOGRAPHY;  Surgeon: Cherrie Toribio SAUNDERS, MD;  Location: [facility] INVASIVE CV LAB;  Service: Cardiovascular;  Laterality: N/A;    reports that he quit smoking about 10 years ago. His smoking use included cigarettes and e-cigarettes. He has a 15 pack-year smoking history. He has never used smokeless tobacco. He reports that he does not currently use alcohol. He reports that he does not currently use drugs after having used the following drugs: Cocaine, Fentanyl , Heroin, Hydrocodone, Ketamine, Marijuana, MDMA (Ecstacy), Methamphetamines, Morphine, Oxycodone , and Other-see comments. Allergies  Allergen Reactions    Acetaminophen  Nausea Only   Family History  Problem Relation Age of Onset   COPD Mother    Heart disease Mother    Arthritis Mother    Depression Mother    Obesity Mother    Heart attack Mother    Heart attack Father    Stroke Father    Alcohol abuse Father    Drug abuse Father    Bone cancer Maternal Grandmother    Arthritis Maternal Grandmother    Diabetes Maternal Grandmother    Early death Maternal Grandmother    Obesity Maternal Grandmother    Varicose Veins Maternal Grandmother    Stomach cancer Maternal Grandfather    Cancer Maternal Grandfather    COPD Maternal Grandfather    ADD / ADHD Daughter    Alcohol abuse Brother    Depression Brother    Learning disabilities Brother    Obesity Brother    Asthma Sister    Depression Sister    Drug abuse Sister    Miscarriages / [place]  Sister    Obesity Sister    Cancer Maternal Uncle    Early death Maternal Uncle    Depression Maternal Aunt    Drug abuse Maternal Aunt    Miscarriages / Stillbirths Maternal Aunt    Prior to Admission medications   Medication Sig Start Date End Date Taking? Authorizing Provider  albuterol  (PROVENTIL ) (2.5 MG/3ML) 0.083% nebulizer solution Take 3 mLs (2.5 mg total) by nebulization every 6 (six) hours as needed for wheezing or shortness of breath. Patient not taking: Reported on 04/30/2024 10/23/23   Cyndi Shaver, PA-C  budesonide -formoterol  (SYMBICORT ) 160-4.5 MCG/ACT inhaler Inhale 2 puffs into the lungs 2 (two) times daily. 04/14/24   Cyndi Shaver, PA-C  carvedilol  (COREG ) 6.25 MG tablet Take 1 tablet (6.25 mg total) by mouth 2 (two) times daily. 10/30/23 10/29/24  Clegg, Amy D, NP  digoxin  (LANOXIN ) 0.125 MG tablet Take 1 tablet (0.125 mg total) by mouth daily. 04/30/24   Lee, [place], NP  empagliflozin  (JARDIANCE ) 10 MG TABS tablet Take 1 tablet (10 mg total) by mouth daily before breakfast. 04/15/24   Bensimhon, Toribio SAUNDERS, MD  LORazepam  (ATIVAN ) 1 MG tablet Take 20-30 minutes before  lab appointment Patient taking differently: Take 1 mg by mouth as needed for anxiety. Take 20-30 minutes before lab appointment 10/23/23   Cyndi Shaver, PA-C  metolazone  (ZAROXOLYN ) 2.5 MG tablet Take 1 tablet (2.5 mg total) by mouth once a week. Take 1 tablet today and then 1 tablet 2.5 mg every Friday 40 meq every Potassium 01/16/24   [place], [place], FNP  potassium chloride  SA (KLOR-CON  M) 20 MEQ tablet Take 2 tablets (40 mEq total) by mouth 2 (two) times daily. Take an extra 2 tabs when you take metolazone  04/30/24   Lee, [place], NP  sacubitril -valsartan  (ENTRESTO ) 24-26 MG Take 1 tablet by mouth 2 (two) times daily. 12/30/23   Bensimhon, Toribio SAUNDERS, MD  spironolactone  (ALDACTONE ) 25 MG tablet TAKE 1 TABLET(25 MG) BY MOUTH DAILY 01/21/24   Bensimhon, Toribio SAUNDERS, MD  torsemide  (DEMADEX ) 20 MG tablet Take 2 tablets (40 mg total) by mouth 2 (two) times daily. 10/30/23   Lenetta No D, NP                                                                                 Vitals:   07/03/24 1851 07/03/24 1854 07/03/24 1900 07/03/24 1915  BP: (!) 88/62 (!) 87/51 102/67 97/77  Pulse: 92 (!) 56 98 95  Resp: (!) 8 18 19  (!) 9  Temp:      TempSrc:      SpO2: 97% 100% 97% 97%  Weight:      Height:       Physical Exam Vitals reviewed.  Constitutional:      General: He is not in acute distress.    Appearance: He is obese. He is ill-appearing.  HENT:     Head: Normocephalic and atraumatic.  Eyes:     Extraocular Movements: Extraocular movements intact.  Cardiovascular:     Rate and Rhythm: Regular rhythm. Tachycardia present.     Heart sounds: Murmur heard.  Pulmonary:     Effort: Pulmonary effort is normal.  Breath sounds: Normal breath sounds.  Abdominal:     General: There is no distension.     Palpations: Abdomen is soft.     Tenderness: There is no abdominal tenderness.  Musculoskeletal:       Arms:     Right lower leg: No edema.     Left lower leg: No edema.  Neurological:      General: No focal deficit present.     Mental Status: He is alert and oriented to person, place, and time.     Labs on Admission: I have personally reviewed following labs and imaging studies CBC: Recent Labs  Lab 07/03/24 1355 07/03/24 1431  WBC 10.3  --   NEUTROABS 9.3*  --   HGB 14.2 14.6  HCT 43.6 43.0  MCV 92.2  --   PLT 131*  --    Basic Metabolic Panel: Recent Labs  Lab 07/03/24 1355 07/03/24 1431  NA 132* 133*  K 3.8 3.7  CL 95* 93*  CO2 25  --   GLUCOSE 279* 218*  BUN 13 15  CREATININE 1.17 1.10  CALCIUM 8.7*  --    GFR: Estimated Creatinine Clearance: 123.4 mL/min (by C-G formula based on SCr of 1.1 mg/dL). Liver Function Tests: Recent Labs  Lab 07/03/24 1355  AST 52*  ALT 37  ALKPHOS 64  BILITOT 1.1  PROT 7.1  ALBUMIN 3.0*   No results for input(s): LIPASE, AMYLASE in the last 168 hours. No results for input(s): AMMONIA in the last 168 hours. Recent Labs    09/02/23 1320 09/05/23 0955 09/22/23 1425 01/16/24 1423 03/23/24 1627 04/30/24 1436 07/03/24 1355 07/03/24 1431  BUN 16 19 16 9 11 8 13 15   CREATININE 0.92 1.07 1.22 1.06 1.23 1.02 1.17 1.10    Cardiac Enzymes: No results for input(s): CKTOTAL, CKMB, CKMBINDEX, TROPONINI in the last 168 hours. BNP (last 3 results) Recent Labs    09/03/23 1115 09/05/23 0955  PROBNP 1,085* 1,323*   HbA1C: No results for input(s): HGBA1C in the last 72 hours. CBG: No results for input(s): GLUCAP in the last 168 hours. Lipid Profile: No results for input(s): CHOL, HDL, LDLCALC, TRIG, CHOLHDL, LDLDIRECT in the last 72 hours. Thyroid  Function Tests: No results for input(s): TSH, T4TOTAL, FREET4, T3FREE, THYROIDAB in the last 72 hours. Anemia Panel: No results for input(s): VITAMINB12, FOLATE, FERRITIN, TIBC, IRON, RETICCTPCT in the last 72 hours. Urine analysis: No results found for: COLORURINE, APPEARANCEUR, LABSPEC, PHURINE,  GLUCOSEU, HGBUR, BILIRUBINUR, KETONESUR, PROTEINUR, UROBILINOGEN, NITRITE, LEUKOCYTESUR Radiological Exams on Admission: CT Angio Chest/Abd/Pel for Dissection W and/or Wo Contrast Result Date: 07/03/2024 CLINICAL DATA:  Acute aortic syndrome suspected. Shortness of breath, chest pain, recent pneumonia. EXAM: CT ANGIOGRAPHY CHEST, ABDOMEN AND PELVIS TECHNIQUE: Non-contrast CT of the chest was initially obtained. Multidetector CT imaging through the chest, abdomen and pelvis was performed using the standard protocol during bolus administration of intravenous contrast. Multiplanar reconstructed images and MIPs were obtained and reviewed to evaluate the vascular anatomy. RADIATION DOSE REDUCTION: This exam was performed according to the departmental dose-optimization program which includes automated exposure control, adjustment of the mA and/or kV according to patient size and/or use of iterative reconstruction technique. CONTRAST:  OMNIPAQUE  IOHEXOL  350 MG/ML SOLN COMPARISON:  None Available. FINDINGS: CTA CHEST FINDINGS Cardiovascular: No aortic intramural hematoma on precontrast imaging nor dissection or aneurysm on postcontrast imaging. Heart is enlarged with left ventricular dilatation. No pericardial effusion. Enlarged pulmonic trunk. Mediastinum/Nodes: No pathologically enlarged mediastinal, hilar or axillary lymph  nodes. Esophagus is grossly unremarkable. Lungs/Pleura: Patchy peribronchovascular ground-glass in the posterior segment right upper lobe. Tiny juxtapleural nodules are considered benign. Calcified granulomas. No pleural fluid. Airway is unremarkable. Musculoskeletal: Minimal degenerative change in the spine. Review of the MIP images confirms the above findings. CTA ABDOMEN AND PELVIS FINDINGS VASCULAR Aorta: No aneurysm or dissection.  Atherosclerotic calcification. Celiac: Widely patent. SMA: Widely patent. Renals: Single bilaterally, widely patent. IMA: Widely patent.  Inflow: Atherosclerotic calcification.  No aneurysm or dissection. Veins: Poorly evaluated due to lack of opacification. Review of the MIP images confirms the above findings. NON-VASCULAR Hepatobiliary: Liver is decreased in attenuation diffusely and is enlarged, 20.7 cm. Gallbladder is unremarkable. No biliary ductal dilatation. Pancreas: Negative. Spleen: Negative. Adrenals/Urinary Tract: Adrenal glands are unremarkable. Small low-attenuation lesions in the kidneys, too small to characterize. No specific follow-up necessary. Ureters are decompressed. Bladder is grossly unremarkable. Stomach/Bowel: Small hiatal hernia. Stomach, small bowel and colon are otherwise unremarkable. Appendix is not readily visualized. Lymphatic: No pathologically enlarged lymph nodes. Reproductive: Prostate is normal in size. Other: Small bilateral inguinal hernias contain fat. No free fluid. Mesenteries and peritoneum are unremarkable. Musculoskeletal: Minimal degenerative change in the spine. Vague sclerosis in the superior aspect of the L4 vertebral body, nonspecific. Review of the MIP images confirms the above findings. IMPRESSION: 1. No evidence of acute aortic syndrome or other cause of the patient's clinical history. 2. Small area of peribronchovascular ground-glass in the right upper lobe, infectious/inflammatory in etiology. 3. Cardiac enlargement with left ventricular dilatation. 4. Steatotic enlarged liver. 5. Enlarged pulmonic trunk, indicative of pulmonary arterial hypertension. 6.  Aortic atherosclerosis (ICD10-I70.0). Electronically Signed   By: Newell Eke M.D.   On: 07/03/2024 15:48   DG Chest Port 1 View Result Date: 07/03/2024 CLINICAL DATA:  Questionable sepsis. EXAM: PORTABLE CHEST 1 VIEW COMPARISON:  Chest radiograph dated 03/25/2024. FINDINGS: No focal consolidation, pleural effusion, or pneumothorax. Mild cardiomegaly. Left pectoral pacemaker device. No acute osseous pathology. IMPRESSION: 1. No active  disease. 2. Mild cardiomegaly. Electronically Signed   By: Vanetta Chou M.D.   On: 07/03/2024 14:20   Data Reviewed: Relevant notes from primary care and specialist visits, past discharge summaries as available in EHR, including Care Everywhere . Prior diagnostic testing as pertinent to current admission diagnoses, Updated medications and problem lists for reconciliation .ED course, including vitals, labs, imaging, treatment and response to treatment,Triage notes, nursing and pharmacy notes and ED provider's notes.Notable results as noted in HPI.Discussed case with EDMD/ ED APP/ or Specialty MD on call and as needed.  Assessment & Plan  Morbidly obese patient with poorly controlled diabetes history presenting with chest pain shortness of breath back pain abdominal pain and sepsis presentation initially with a lactic of 5.4.  Will admit to stepdown unit.  >> Severe sepsis with organ dysfunction: CT imaging shows concerns of possible pneumonia which I suspect is not the only source in this case patient has back pain and CT imaging shows sclerosis at L4 level and we will proceed with an MRI I suspect ruling out discitis and him it is prudent along with a 2D echocardiogram and ultrasound of the ICD insertion site. Will panculture patient with blood and urine no skin or wound areas that need to be cultured. Start antibiotic coverage to include broad-spectrum coverage for MRSA gram-positive gram-negative and anaerobic. Due to his HFrEF cautious IV fluid rehydration and low threshold for vasopressor therapy.  >> Hypovolemia: Attributed to sepsis and suspect septic shock, we will monitor closely on  stepdown with frequent blood pressure checks.  Albumin is 3.0.   Vitals:   07/03/24 1800 07/03/24 1803 07/03/24 1806 07/03/24 1809  BP: 96/62 (!) 100/58 (!) 96/58 (!) 98/54   07/03/24 1812 07/03/24 1815 07/03/24 1830 07/03/24 1845  BP: 96/60 (!) 93/56 108/63 95/62   07/03/24 1851 07/03/24 1854 07/03/24  1900 07/03/24 1915  BP: (!) 88/62 (!) 87/51 102/67 97/77    >> Ischemic cardiomyopathy/left bundle branch block/status post ICD: Will get a 2D echocardiogram will defer to a.m. team for cardiology consult, for elevated troponins possible developing NSTEMI and for interrogation of device.   >> Morbid obesity with a BMI of 41.56/diabetes mellitus type 2: A1c to assess and optimize glucose control for healing and preventing or worsening current infections.  Nutrition counseling prior to discharge. Case management counseling for medication assistance as needed.   >> COPD: As needed albuterol  continuous pulse oximetry.   >> Low back pain: MRI of the lumbar spine continue with broad-spectrum antibiotics, specialist consult as deemed appropriate.    DVT prophylaxis:  Heparin  every 12 hours INR is 1.4  Consults:  None  Advance Care Planning:    Code Status: Full Code   Family Communication:  None Disposition Plan:  To be determined Severity of Illness: The appropriate patient status for this patient is INPATIENT. Inpatient status is judged to be reasonable and necessary in order to provide the required intensity of service to ensure the patient's safety. The patient's presenting symptoms, physical exam findings, and initial radiographic and laboratory data in the context of their chronic comorbidities is felt to place them at high risk for further clinical deterioration. Furthermore, it is not anticipated that the patient will be medically stable for discharge from the hospital within 2 midnights of admission.   * I certify that at the point of admission it is my clinical judgment that the patient will require inpatient hospital care spanning beyond 2 midnights from the point of admission due to high intensity of service, high risk for further deterioration and high frequency of surveillance required.*  Unresulted Labs (From admission, onward)     Start     Ordered   07/04/24 0500   Protime-INR  Tomorrow morning,   R        07/03/24 1913   07/04/24 0500  Cortisol-am, blood  Tomorrow morning,   R        07/03/24 1913   07/03/24 1915  Digoxin  level  Once,   R        07/03/24 1915   07/03/24 1910  Hemoglobin A1c  Once,   R       Comments: To assess prior glycemic control    07/03/24 1913   07/03/24 1805  Respiratory (~20 pathogens) panel by PCR  (Respiratory panel by PCR (~20 pathogens, ~24 hr TAT)  w precautions)  Once,   URGENT        07/03/24 1804   07/03/24 1709  CK  Add-on,   AD        07/03/24 1708   07/03/24 1708  Ethanol  Add-on,   AD        07/03/24 1707   07/03/24 1706  Magnesium   Add-on,   AD        07/03/24 1707   07/03/24 1343  Blood Culture (routine x 2)  (Septic presentation on arrival (screening labs, nursing and treatment orders for obvious sepsis))  BLOOD CULTURE X 2,   STAT      07/03/24  1343   07/03/24 1343  Urinalysis, w/ Reflex to Culture (Infection Suspected) -Urine, Clean Catch  (Septic presentation on arrival (screening labs, nursing and treatment orders for obvious sepsis))  ONCE - URGENT,   URGENT       Question:  Specimen Source  Answer:  Urine, Clean Catch   07/03/24 1343            Meds ordered this encounter  Medications   lactated ringers infusion   lactated ringers bolus 500 mL    Reason 30 mL/kg dose is not being ordered:   Other (see comment below)    Comment:   severe HF, EF <20   DISCONTD: vancomycin (VANCOCIN) IVPB 1000 mg/200 mL premix    Indication::   Other Indication (list below)    Other Indication::   HCAP   ceFEPIme (MAXIPIME) 2 g in sodium chloride  0.9 % 100 mL IVPB    Antibiotic Indication::   HCAP   vancomycin (VANCOREADY) IVPB 2000 mg/400 mL    Indication::   Other Indication (list below)    Other Indication::   HCAP   fentaNYL  (SUBLIMAZE ) injection 25 mcg   ketorolac  (TORADOL ) 15 MG/ML injection 15 mg   lactated ringers bolus 2,000 mL   iohexol  (OMNIPAQUE ) 350 MG/ML injection 100 mL   HYDROmorphone  (DILAUDID) injection 1 mg   fentaNYL  (SUBLIMAZE ) injection 50 mcg   0.9 %  sodium chloride  infusion   norepinephrine (LEVOPHED) 4mg  in (0.016 mg/mL) premix infusion    IV Access:   Peripheral   sodium chloride  0.9 % bolus 500 mL   insulin aspart (novoLOG) injection 0-15 Units    Correction coverage::   Moderate (average weight, post-op)    CBG < 70::   Implement Hypoglycemia Standing Orders and refer to Hypoglycemia Standing Orders sidebar report    CBG 70 - 120::   0 units    CBG 121 - 150::   2 units    CBG 151 - 200::   3 units    CBG 201 - 250::   5 units    CBG 251 - 300::   8 units    CBG 301 - 350::   11 units    CBG 351 - 400::   15 units    CBG > 400:   call MD and obtain STAT lab verification   heparin  injection 5,000 Units   albuterol  (PROVENTIL ) (2.5 MG/3ML) 0.083% nebulizer solution 2.5 mg   morphine (PF) 2 MG/ML injection 2 mg   pantoprazole (PROTONIX) injection 40 mg   piperacillin-tazobactam (ZOSYN) IVPB 3.375 g     Orders Placed This Encounter  Procedures   Critical Care   Critical Care   Resp panel by RT-PCR (RSV, Flu A&B, Covid) Anterior Nasal Swab   Blood Culture (routine x 2)   Respiratory (~20 pathogens) panel by PCR   DG Chest Port 1 View   CT Angio Chest/Abd/Pel for Dissection W and/or Wo Contrast   Comprehensive metabolic panel   CBC with Differential   Protime-INR   Urinalysis, w/ Reflex to Culture (Infection Suspected) -Urine, Clean Catch   Magnesium    Ethanol   CK   Hemoglobin A1c   Protime-INR   Cortisol-am, blood   Digoxin  level   Diet clear liquid Room service appropriate? Yes; Fluid consistency: Thin   Document height and weight   Assess and Document Glasgow Coma Scale   Document vital signs within 1-hour of fluid bolus completion. Notify provider of abnormal vital signs despite  fluid resuscitation.   DO NOT delay antibiotics if unable to obtain blood culture.   Refer to Sidebar Report: Sepsis Sidebar ED/IP   Notify provider for  difficulties obtaining IV access.   Insert peripheral IV x 2   Initiate Carrier Fluid Protocol   Vasopressor infusion via peripheral IV access   Assess IV site every 2 hours for vasopressor extravasation   IV site should be re-confirmed by blood return every 24 hours of vasopressor administration   Peripheral administration of vasopressors should not exceed 72 hours   Strict intake and output   Apply Diabetes Mellitus Care Plan   STAT CBG when hypoglycemia is suspected. If treated, recheck every 15 minutes after each treatment until CBG >/= 70 mg/dl   Refer to Hypoglycemia Protocol Sidebar Report for treatment of CBG < 70 mg/dl   Vital signs   Notify physician (specify)   Refer to Sidebar Report Mobility Protocol for Adult Inpatient   If lactate (lactic acid) >2, verify repeat lactic acid order has been placed to be drawn   Document vital signs within 1-hour of fluid bolus completion and notify provider of bolus completion   Vital signs   Vital signs   RN to call RRT (rapid response team)   Initiate Adult Central Line Maintenance and Catheter Protocol for patients with central line (CVC, PICC, Port, Hemodialysis, Trialysis)   Apply Sepsis Care Plan   Refer to Sidebar Report: Sepsis Bundle ED/IP   Assess and Document Glasgow Coma Scale   Initiate Oral Care Protocol   Initiate Carrier Fluid Protocol   RN may order General Admission PRN Orders utilizing General Admission PRN medications (through manage orders) for the following patient needs: allergy symptoms (Claritin), cold sores (Carmex), cough (Robitussin DM), eye irritation (Liquifilm Tears), hemorrhoids (Tucks), indigestion (Maalox), minor skin irritation (Hydrocortisone  Cream), muscle pain (Ben Gay), nose irritation (saline nasal spray) and sore throat (Chloraseptic spray).   Neuro checks   Cardiac Monitoring - Continuous Indefinite   Bed rest   Measure blood pressure   Full code   Code Sepsis activation.  This occurs  automatically when order is signed and prioritizes pharmacy, lab, and radiology services for STAT collections and interventions.  If [facility] downtime, call [facility] [phone]) to activate Code Sepsis.   Consult for [facility] Admission   pharmacy consult   Consult to intensivist   vancomycin per pharmacy consult   Droplet precaution   Pulse oximetry (single)   Pulse oximetry, continuous   I-Stat Lactic Acid, ED   I-stat chem 8, ED (not at [facility], [facility] or [facility])   I-Stat CG4 Lactic Acid   EKG 12-Lead   ED EKG   EKG 12-Lead   ECHOCARDIOGRAM COMPLETE BUBBLE STUDY   Admit to Inpatient (patient's expected length of stay will be greater than 2 midnights or inpatient only procedure)   Aspiration precautions   Fall precautions    Author: Mario LULLA Blanch, MD 12 pm- 8 pm. [facility]. 07/03/2024 7:30 PM Please note for any communication after hours contact [facility] Assigned provider on call on Amion.

## 2024-07-03 NOTE — ED Triage Notes (Signed)
 States he was seen at [facility] for chest pain and sob, was dx. With pneumonia given steroids  and antibiotics states he is more sob and his chest hurts worse today

## 2024-07-03 NOTE — ED Provider Triage Note (Signed)
 Emergency Medicine Provider Triage Evaluation Note  Cameron Jenkins , a 48 y.o. male  was evaluated in triage.  Pt complains of shortness of breath, chest pain, recently diagnosed with pneumonia, given steroids, antibiotics, told to return to the emergency department if symptoms worsen.  Arrives tachycardic, febrile.  History of CHF, cardiomyopathy, recently with ICD implantation secondary to EF less than 20.SABRA  Review of Systems  Positive: Shob, chest pain Negative:   Physical Exam  BP 109/79 (BP Location: Left Arm)   Pulse (!) 140   Temp 100.1 F (37.8 C)   Resp (!) 22   Ht 6' 1 (1.854 m)   Wt (!) 142.9 kg   SpO2 95%   BMI 41.56 kg/m  Gen:   Awake, ill appearing  Resp:   MSK:   Moves extremities without difficulty  Other:  Increased respiratory effort, tachypnea, question some rhonchi at lower lung bases, no significant wheezing.  Medical Decision Making  Medically screening exam initiated at 1:44 PM.  Appropriate orders placed.  Cameron Jenkins was informed that the remainder of the evaluation will be completed by another provider, this initial triage assessment does not replace that evaluation, and the importance of remaining in the ED until their evaluation is complete.  Workup initiated in triage  Code sepsis activation, charge nurse informed   Cameron Sherlean DEL, PA-C 07/03/24 1344

## 2024-07-03 NOTE — Progress Notes (Addendum)
 Sepsis/ pna. No ischm CMP. BV ICD 2 months ago.   Chest pain/ back pain. X 4 days.  Fever.  Lactic.  S tach  Bp low normal initially at bedside 70's no room for albumin or fluids needs VP and will recommend ICU.   On exam pt appears ill, and has x skin areas c/w MRSA and has c/o lower back pain and pain over his ICD and abd pain.  I am very concerned about pt having discitis /  IE / PMM pocket infection, recommended ICU.

## 2024-07-03 NOTE — ED Provider Notes (Signed)
 [place] [facility] Provider Note   CSN: [id number] Arrival date & time: 07/03/24  1311     History Chief Complaint  Patient presents with   Shortness of Breath    HPI: Cameron Jenkins is a 48 y.o. male with history pertinent for HFrEF with EF of 20 to 25% (July 2025) with ICD, HTN, polysubstance use disorder, prior cardiac arrest, COPD, anxiety who presents complaining of chest pain, shortness of breath, fever. Patient arrived via POV.  History provided by patient.  No interpreter required during this encounter.  Patient reports that he has had several days of chest pain, shortness of breath.  [facility], was diagnosed with pneumonia and was prescribed antibiotics as well as steroids.  Reports that over the past 24 hours he has had persistent fever with Tmax of 101.3 at home, as well as worsening shortness of breath, and increased severity of chest pain, reports that it is both a sharp pain as well as a substernal pressure that radiates to his back.  Denies nausea, vomiting, diarrhea  Patient's recorded medical, surgical, social, medication list and allergies were reviewed in the Snapshot window as part of the initial history.   Prior to Admission medications   Medication Sig Start Date End Date Taking? Authorizing Provider  albuterol  (PROVENTIL ) (2.5 MG/3ML) 0.083% nebulizer solution Take 3 mLs (2.5 mg total) by nebulization every 6 (six) hours as needed for wheezing or shortness of breath. Patient not taking: Reported on 04/30/2024 10/23/23   Cyndi Shaver, PA-C  budesonide -formoterol  (SYMBICORT ) 160-4.5 MCG/ACT inhaler Inhale 2 puffs into the lungs 2 (two) times daily. 04/14/24   Cyndi Shaver, PA-C  carvedilol  (COREG ) 6.25 MG tablet Take 1 tablet (6.25 mg total) by mouth 2 (two) times daily. 10/30/23 10/29/24  Clegg, Amy D, NP  digoxin  (LANOXIN ) 0.125 MG tablet Take 1 tablet (0.125 mg total) by mouth  daily. 04/30/24   Lee, [place], NP  empagliflozin  (JARDIANCE ) 10 MG TABS tablet Take 1 tablet (10 mg total) by mouth daily before breakfast. 04/15/24   Bensimhon, Toribio SAUNDERS, MD  LORazepam  (ATIVAN ) 1 MG tablet Take 20-30 minutes before lab appointment Patient taking differently: Take 1 mg by mouth as needed for anxiety. Take 20-30 minutes before lab appointment 10/23/23   Cyndi Shaver, PA-C  metolazone  (ZAROXOLYN ) 2.5 MG tablet Take 1 tablet (2.5 mg total) by mouth once a week. Take 1 tablet today and then 1 tablet 2.5 mg every Friday 40 meq every Potassium 01/16/24   [place], [place], FNP  potassium chloride  SA (KLOR-CON  M) 20 MEQ tablet Take 2 tablets (40 mEq total) by mouth 2 (two) times daily. Take an extra 2 tabs when you take metolazone  04/30/24   Lee, [place], NP  sacubitril -valsartan  (ENTRESTO ) 24-26 MG Take 1 tablet by mouth 2 (two) times daily. 12/30/23   Bensimhon, Toribio SAUNDERS, MD  spironolactone  (ALDACTONE ) 25 MG tablet TAKE 1 TABLET(25 MG) BY MOUTH DAILY 01/21/24   Bensimhon, Toribio SAUNDERS, MD  torsemide  (DEMADEX ) 20 MG tablet Take 2 tablets (40 mg total) by mouth 2 (two) times daily. 10/30/23   Clegg, Amy D, NP     Allergies: Acetaminophen    Review of Systems   ROS as per HPI  Physical Exam Updated Vital Signs BP 102/72   Pulse (!) 104   Temp (!) 102.4 F (39.1 C) (Oral)   Resp 15   Ht 6' 1 (1.854 m)   Wt (!) 142.9 kg  SpO2 97%   BMI 41.56 kg/m  Physical Exam Vitals and nursing note reviewed.  Constitutional:      General: He is in acute distress.     Appearance: He is well-developed. He is ill-appearing, toxic-appearing and diaphoretic.  HENT:     Head: Normocephalic and atraumatic.  Eyes:     Conjunctiva/sclera: Conjunctivae normal.  Cardiovascular:     Rate and Rhythm: Regular rhythm. Tachycardia present.     Heart sounds: No murmur heard. Pulmonary:     Effort: Pulmonary effort is normal. Tachypnea present. No respiratory distress.     Breath sounds: Normal breath sounds.   Abdominal:     Palpations: Abdomen is soft.     Tenderness: There is no abdominal tenderness.  Musculoskeletal:        General: No swelling.     Cervical back: Neck supple.     Right lower leg: No tenderness. No edema.     Left lower leg: No tenderness. No edema.  Skin:    General: Skin is warm.     Capillary Refill: Capillary refill takes less than 2 seconds.  Neurological:     Mental Status: He is alert.  Psychiatric:        Mood and Affect: Mood normal.     ED Course/ Medical Decision Making/ A&P   Procedures .Critical Care  Performed by: Rogelia Jerilynn RAMAN, MD Authorized by: Rogelia Jerilynn RAMAN, MD   Critical care provider statement:    Critical care time (minutes):  32   Critical care was necessary to treat or prevent imminent or life-threatening deterioration of the following conditions:  Cardiac failure and sepsis   Critical care was time spent personally by me on the following activities:  Development of treatment plan with patient or surrogate, discussions with consultants, evaluation of patient's response to treatment, examination of patient, ordering and review of laboratory studies, ordering and review of radiographic studies, ordering and performing treatments and interventions, pulse oximetry, re-evaluation of patient's condition and review of old charts    Medications Ordered in ED Medications  lactated ringers infusion (has no administration in time range)  vancomycin (VANCOREADY) IVPB 2000 mg/400 mL (2,000 mg Intravenous New Bag/Given 07/03/24 1517)  lactated ringers bolus 500 mL (0 mLs Intravenous Stopped 07/03/24 1513)  ceFEPIme (MAXIPIME) 2 g in sodium chloride  0.9 % 100 mL IVPB (0 g Intravenous Stopped 07/03/24 1444)  fentaNYL  (SUBLIMAZE ) injection 25 mcg (25 mcg Intravenous Given 07/03/24 1430)  ketorolac  (TORADOL ) 15 MG/ML injection 15 mg (15 mg Intravenous Given 07/03/24 1440)  lactated ringers bolus 2,000 mL (2,000 mLs Intravenous New Bag/Given  07/03/24 1524)  iohexol  (OMNIPAQUE ) 350 MG/ML injection 100 mL (100 mLs Intravenous Contrast Given 07/03/24 1512)  HYDROmorphone (DILAUDID) injection 1 mg (1 mg Intravenous Given 07/03/24 1520)    Medical Decision Making:   Cameron Jenkins is a 48 y.o. male who presents for full symptoms as per above.  Physical exam is pertinent for diaphoresis, ill appearance, tachycardia, tachypnea, very uncomfortable appearing.   The differential includes but is not limited to ACS, aortic dissection, sepsis, pneumonia, pneumothorax.  Independent historian: None  External data reviewed: No pertinent external data  Labs: Ordered, Independent interpretation, and Details: COVID/flu/RSV negative.  Coags mildly elevated with PT 17.5, INR 1.4. Initial lactic acid elevated at 5.4, delta pending.  Initial troponin elevated at 1.25, delta pending.  CBC without leukocytosis, anemia, thrombocytopenia.  CMP without AKI, emergent electrolyte derangement, emergent LFT abnormality.  Hyperglycemia without anion gap elevation.  Radiology:  Ordered, Independent interpretation, and Details: Chest x-ray without focal airspace opacification, cardiomediastinal switcher gentian, pneumothorax, pleural effusion, bony derangement.  CTA dissection study without dissection flap, dense pulmonary consolidation, pneumothorax, pleural effusion, intra-abdominal free fluid or free air.  Agree with radiology interpretation of chest x-ray, CT interpretation pending at the time of handoff.  DG Chest Port 1 View Result Date: 07/03/2024 CLINICAL DATA:  Questionable sepsis. EXAM: PORTABLE CHEST 1 VIEW COMPARISON:  Chest radiograph dated 03/25/2024. FINDINGS: No focal consolidation, pleural effusion, or pneumothorax. Mild cardiomegaly. Left pectoral pacemaker device. No acute osseous pathology. IMPRESSION: 1. No active disease. 2. Mild cardiomegaly. Electronically Signed   By: Vanetta Chou M.D.   On: 07/03/2024 14:20    EKG/Medicine tests:  Ordered, Independent interpretation, and Details: Rate 130, sinus rhythm, widened QRS, ST segments and T waves overall similar in comparison to most recent prior from July 20205  Interventions:Fentanyl , Toradol , cefepime, vancomycin, 2.5 L LR bolus   See the EMR for full details regarding lab and imaging results.  Patient presents to the emergency department for numerous symptoms.  Is febrile, tachycardic on arrival, was activated as a code sepsis in triage prior to my evaluation, vancomycin and cefepime were ordered at that time.  On my evaluation patient is tachycardic, tachypneic, chest x-ray does not demonstrate overt pneumonia, despite patient reporting that he has pneumonia.  Given patient's tachycardia, severe pain radiating from his chest to his back, and significant discomfort on exam, I do have concern for aortic dissection, therefore CTA of the chest abdomen pelvis was ordered.  Additionally patient has significant elevation of lactic acid, therefore additional 2 L LR ordered in addition to the 500 cc bolus that patient was ordered in triage.  Patient administered fentanyl  for pain control.  I did review the patient's CT, and I do not personally appreciate aortic dissection.  On reevaluation after fluids and fentanyl , patient with downtrending heart rate, improved pain.  Plan at the time of signout, follow-up repeat troponin, repeat lactic acid, CT read, anticipate admission for dissection versus troponin elevation versus sepsis and/or commendation thereof.  Presentation is most consistent with acute life/limb-threatening illness  Discussion of management or test interpretations with external provider(s): None by the time of handoff  Risk Drugs:Prescription drug management and Parenteral controlled substances Treatment: Decision regarding hospitalization Critical Care: 32 minutes  Disposition: HANDOFF: At the time of signout, the patients CT and repeat labs had not yet been completed. I  transferred care of the patient at the time of signout to Dr. Cottie. I informed the incoming care provider of the patient's history, status, and management plan. I addressed all of their concerns and/or questions to the best of my ability. Please refer to the incoming care provider's note for details regarding the remainder of the patient's ED course and disposition.  MDM generated using voice dictation software and may contain dictation errors.  Please contact me for any clarification or with any questions.  Clinical Impression:  1. Pneumonia due to infectious organism, unspecified laterality, unspecified part of lung   2. Sepsis, due to unspecified organism, unspecified whether acute organ dysfunction present [facility])      Admit   Final Clinical Impression(s) / ED Diagnoses Final diagnoses:  Pneumonia due to infectious organism, unspecified laterality, unspecified part of lung  Sepsis, due to unspecified organism, unspecified whether acute organ dysfunction present [facility])    Rx / DC Orders ED Discharge Orders     None        Rogelia Satterfield  S, MD 07/03/24 1700

## 2024-07-03 NOTE — Sepsis Progress Note (Signed)
 Elink following code sepsis

## 2024-07-03 NOTE — Progress Notes (Signed)
 Pharmacy Antibiotic Note  Cameron Jenkins is a 48 y.o. male for which pharmacy has been consulted for vancomycin and zosyn dosing for sepsis.  Patient with a history of HFrEF with ICD, HTN, polysubstance use disorder, prior cardiac arrest, COPD, anxiety. Patient presenting with SOB.  SCr 1.1 WBC 10.3; LA 5.4>1.4; T 102.4>98.8; HR 111>95; RR 23>9 COVID neg / flu neg  Cefepime given in ED x 1   Plan: Zosyn 3.375g IV q8h (4 hour infusion) Vancomycin 2000 mg once then 1500 mg q12hr (eAUC 510.3) unless change in renal function Monitor WBC, fever, renal function, cultures De-escalate when able Levels at steady state  Height: 6' 1 (185.4 cm) Weight: (!) 142.9 kg (315 lb) IBW/kg (Calculated) : 79.9  Temp (24hrs), Avg:100.3 F (37.9 C), Min:98.5 F (36.9 C), Max:102.4 F (39.1 C)  Recent Labs  Lab 07/03/24 1355 07/03/24 1403 07/03/24 1431 07/03/24 1620  WBC 10.3  --   --   --   CREATININE 1.17  --  1.10  --   LATICACIDVEN  --  5.4*  --  1.4    Estimated Creatinine Clearance: 123.4 mL/min (by C-G formula based on SCr of 1.1 mg/dL).    Allergies  Allergen Reactions   Acetaminophen  Nausea Only   Microbiology results: Pending  Thank you for allowing pharmacy to be a part of this patient's care.  Dorn Buttner, PharmD, BCPS 07/03/2024 7:17 PM ED Clinical Pharmacist -  [phone]

## 2024-07-04 ENCOUNTER — Inpatient Hospital Stay (HOSPITAL_COMMUNITY): Payer: MEDICAID

## 2024-07-04 DIAGNOSIS — A419 Sepsis, unspecified organism: Secondary | ICD-10-CM | POA: Diagnosis not present

## 2024-07-04 DIAGNOSIS — R6520 Severe sepsis without septic shock: Secondary | ICD-10-CM | POA: Diagnosis not present

## 2024-07-04 DIAGNOSIS — R000 Tachycardia, unspecified: Secondary | ICD-10-CM

## 2024-07-04 DIAGNOSIS — I509 Heart failure, unspecified: Secondary | ICD-10-CM

## 2024-07-04 DIAGNOSIS — Z95810 Presence of automatic (implantable) cardiac defibrillator: Secondary | ICD-10-CM

## 2024-07-04 LAB — URINALYSIS, W/ REFLEX TO CULTURE (INFECTION SUSPECTED)
Bacteria, UA: NONE SEEN
Bilirubin Urine: NEGATIVE
Glucose, UA: NEGATIVE mg/dL
Hgb urine dipstick: NEGATIVE
Ketones, ur: NEGATIVE mg/dL
Leukocytes,Ua: NEGATIVE
Nitrite: NEGATIVE
Protein, ur: NEGATIVE mg/dL
Specific Gravity, Urine: 1.025 (ref 1.005–1.030)
pH: 5 (ref 5.0–8.0)

## 2024-07-04 LAB — RESPIRATORY PANEL BY PCR

## 2024-07-04 LAB — BASIC METABOLIC PANEL WITH GFR
Anion gap: 13 (ref 5–15)
BUN: 15 mg/dL (ref 6–20)
CO2: 24 mmol/L (ref 22–32)
Calcium: 8 mg/dL — ABNORMAL LOW (ref 8.9–10.3)
Chloride: 98 mmol/L (ref 98–111)
Creatinine, Ser: 1 mg/dL (ref 0.61–1.24)
GFR, Estimated: >60 mL/min (ref >60)
Glucose, Bld: 128 mg/dL — ABNORMAL HIGH (ref 70–99)
Potassium: 3.4 mmol/L — ABNORMAL LOW (ref 3.5–5.1)
Sodium: 135 mmol/L (ref 135–145)

## 2024-07-04 LAB — CBC
HCT: 43.1 % (ref 39.0–52.0)
Hemoglobin: 13.3 g/dL (ref 13.0–17.0)
MCH: 30.2 pg (ref 26.0–34.0)
MCHC: 30.9 g/dL (ref 30.0–36.0)
MCV: 97.7 fL (ref 80.0–100.0)
Platelets: 115 K/uL — ABNORMAL LOW (ref 150–400)
RBC: 4.41 MIL/uL (ref 4.22–5.81)
RDW: 14.6 % (ref 11.5–15.5)
WBC: 9.7 K/uL (ref 4.0–10.5)
nRBC: 0 % (ref 0.0–0.2)

## 2024-07-04 LAB — CBG MONITORING, ED
Glucose-Capillary: 142 mg/dL — ABNORMAL HIGH (ref 70–99)
Glucose-Capillary: 139 mg/dL — ABNORMAL HIGH (ref 70–99)
Glucose-Capillary: 191 mg/dL — ABNORMAL HIGH (ref 70–99)

## 2024-07-04 LAB — PROTIME-INR
INR: 1.4 — ABNORMAL HIGH (ref 0.8–1.2)
Prothrombin Time: 18 s — ABNORMAL HIGH (ref 11.4–15.2)

## 2024-07-04 LAB — GLUCOSE, CAPILLARY
Glucose-Capillary: 128 mg/dL — ABNORMAL HIGH (ref 70–99)
Glucose-Capillary: 176 mg/dL — ABNORMAL HIGH (ref 70–99)
Glucose-Capillary: 119 mg/dL — ABNORMAL HIGH (ref 70–99)

## 2024-07-04 LAB — CORTISOL-AM, BLOOD: Cortisol - AM: 9.1 ug/dL (ref 6.7–22.6)

## 2024-07-04 LAB — PROCALCITONIN: Procalcitonin: 0.7 ng/mL

## 2024-07-04 LAB — BRAIN NATRIURETIC PEPTIDE: B Natriuretic Peptide: 482.5 pg/mL — ABNORMAL HIGH (ref 0.0–100.0)

## 2024-07-04 LAB — MRSA NEXT GEN BY PCR, NASAL: MRSA by PCR Next Gen: NOT DETECTED

## 2024-07-04 MED ORDER — METHOCARBAMOL 1000 MG/10ML IJ SOLN: 500.0000 mg | Freq: Three times a day (TID) | INTRAMUSCULAR | Status: DC | PRN

## 2024-07-04 MED ORDER — NITROGLYCERIN 0.4 MG SL SUBL
0.4000 mg | SUBLINGUAL_TABLET | SUBLINGUAL | Status: DC | PRN
  Administered 2024-07-04: 0.4 mg via SUBLINGUAL

## 2024-07-04 MED ORDER — HYDROXYZINE HCL 10 MG PO TABS
10.0000 mg | ORAL_TABLET | Freq: Three times a day (TID) | ORAL | Status: DC | PRN
  Administered 2024-07-05: 10 mg via ORAL
  Filled 2024-07-04: qty 1

## 2024-07-04 MED ORDER — FUROSEMIDE 10 MG/ML IJ SOLN
20.0000 mg | Freq: Once | INTRAMUSCULAR | Status: AC
  Administered 2024-07-04: 20 mg via INTRAVENOUS
  Filled 2024-07-04: qty 2

## 2024-07-04 MED ORDER — MELATONIN 5 MG PO TABS: 5.0000 mg | ORAL_TABLET | Freq: Every evening | ORAL | Status: DC | PRN

## 2024-07-04 MED ORDER — LIDOCAINE 5 % EX PTCH
1.0000 | MEDICATED_PATCH | CUTANEOUS | Status: DC
Start: 2024-07-04 — End: 2024-07-05
  Administered 2024-07-04: 1 via TRANSDERMAL
  Filled 2024-07-04: qty 1

## 2024-07-04 MED ORDER — HYDROMORPHONE HCL 1 MG/ML IJ SOLN
0.5000 mg | INTRAMUSCULAR | Status: DC | PRN
  Administered 2024-07-04: 0.5 mg via INTRAVENOUS
  Filled 2024-07-04: qty 0.5

## 2024-07-04 MED ORDER — NALOXONE HCL 0.4 MG/ML IJ SOLN: 0.8000 mg | INTRAMUSCULAR | Status: AC

## 2024-07-04 MED ORDER — DIPHENHYDRAMINE HCL 50 MG/ML IJ SOLN
12.5000 mg | Freq: Four times a day (QID) | INTRAMUSCULAR | Status: DC | PRN
Start: 2024-07-04 — End: 2024-07-04
  Administered 2024-07-04: 12.5 mg via INTRAVENOUS
  Filled 2024-07-04: qty 1

## 2024-07-04 MED ORDER — POLYETHYLENE GLYCOL 3350 17 G PO PACK: 17.0000 g | PACK | Freq: Every day | ORAL | Status: DC | PRN

## 2024-07-04 MED ORDER — IOHEXOL 350 MG/ML SOLN
100.0000 mL | Freq: Once | INTRAVENOUS | Status: AC | PRN
  Administered 2024-07-04: 100 mL via INTRAVENOUS

## 2024-07-04 MED ORDER — MAGNESIUM SULFATE 4 GM/100ML IV SOLN
4.0000 g | Freq: Once | INTRAVENOUS | Status: AC
  Administered 2024-07-04: 4 g via INTRAVENOUS
  Filled 2024-07-04: qty 100

## 2024-07-04 MED ORDER — POTASSIUM CHLORIDE CRYS ER 20 MEQ PO TBCR
40.0000 meq | EXTENDED_RELEASE_TABLET | ORAL | Status: AC
  Administered 2024-07-04 (×2): 40 meq via ORAL
  Filled 2024-07-04 (×2): qty 2

## 2024-07-04 MED ORDER — HYDROMORPHONE HCL 1 MG/ML IJ SOLN
1.0000 mg | INTRAMUSCULAR | Status: DC | PRN
  Administered 2024-07-04: 1 mg via INTRAVENOUS
  Filled 2024-07-04: qty 1

## 2024-07-04 MED ORDER — ACETAMINOPHEN 325 MG PO TABS
650.0000 mg | ORAL_TABLET | Freq: Four times a day (QID) | ORAL | Status: DC | PRN
  Administered 2024-07-04: 650 mg via ORAL
  Filled 2024-07-04: qty 2

## 2024-07-04 MED ORDER — ALPRAZOLAM 0.5 MG PO TABS
0.5000 mg | ORAL_TABLET | Freq: Two times a day (BID) | ORAL | Status: DC | PRN
  Administered 2024-07-04: 0.5 mg via ORAL
  Filled 2024-07-04: qty 1

## 2024-07-04 MED ORDER — METHOCARBAMOL 500 MG PO TABS
500.0000 mg | ORAL_TABLET | ORAL | Status: AC
  Administered 2024-07-04: 500 mg via ORAL
  Filled 2024-07-04: qty 1

## 2024-07-04 MED ORDER — DIGOXIN 125 MCG PO TABS
0.1250 mg | ORAL_TABLET | Freq: Every day | ORAL | Status: DC
  Administered 2024-07-04 – 2024-07-05 (×2): 0.125 mg via ORAL
  Filled 2024-07-04 (×2): qty 1

## 2024-07-04 MED ORDER — NALOXONE HCL 0.4 MG/ML IJ SOLN
INTRAMUSCULAR | Status: AC
  Administered 2024-07-04: 0.2 mg via INTRAVENOUS
  Filled 2024-07-04: qty 1

## 2024-07-04 MED ORDER — MENTHOL 3 MG MT LOZG
1.0000 | LOZENGE | OROMUCOSAL | Status: DC | PRN
  Administered 2024-07-05: 3 mg via ORAL
  Filled 2024-07-04 (×2): qty 9

## 2024-07-04 MED ORDER — NITROGLYCERIN 0.4 MG SL SUBL
SUBLINGUAL_TABLET | SUBLINGUAL | Status: AC
  Filled 2024-07-04: qty 1

## 2024-07-04 MED ORDER — PROCHLORPERAZINE EDISYLATE 10 MG/2ML IJ SOLN
5.0000 mg | Freq: Four times a day (QID) | INTRAMUSCULAR | Status: DC | PRN
  Administered 2024-07-05: 5 mg via INTRAVENOUS
  Filled 2024-07-04: qty 2

## 2024-07-04 MED ORDER — DM-GUAIFENESIN ER 30-600 MG PO TB12
1.0000 | ORAL_TABLET | Freq: Two times a day (BID) | ORAL | Status: DC
  Administered 2024-07-04: 1 via ORAL
  Filled 2024-07-04: qty 1

## 2024-07-04 NOTE — ED Notes (Signed)
 Pt sitting up eating lunch tray provided by dietary.

## 2024-07-04 NOTE — Consult Note (Addendum)
 Cardiology Consultation   Patient ID: Cameron Jenkins MRN: [id number]; DOB: 1976-04-06  Admit date: 07/03/2024 Date of Consult: 07/04/2024  PCP:  Patient, No Pcp Per   [facility] Providers Cardiologist:  None  Electrophysiologist:  Cameron Kitty, MD  Advanced Heart Failure:  Cameron Fuel, MD       Patient Profile: Cameron Jenkins is a 48 y.o. male with a hx of  (HFrEF) heart failure with reduced ejection fraction  Non-ischemic cardiomyopathy TTE 10/07/22: EF 20-25, mild LVh, global HK, NL RVSF, mild MR LHC 12/2022: no CAD  CMR 01/23/23: EF 15 S/p CRT-D 03/2024 Pulmonary hypertension  Left Bundle Branch Block Hypertension  Severe OSA, mod central sleep apnea, nocturnal hypoxia Chronic Obstructive Pulmonary Disease  Substance/IV drug abuse Anxiety   who is being seen 07/04/2024 for the evaluation of CHF at the request of Dr. Arlice.  History of Present Illness: Mr. Cameron Jenkins was admitted yesterday with sepsis with CT showing possible pneumonia. He had recently been treated with antibiotics and steroids for a cough and shortness of breath. He was hypotensive and started on Norepinephrine and IVFs. NE was weaned off with stable BPs. He is also complaining of severe back and additional source for infection under investigation.  He notes chest pain, shortness of breath, cough with no improvement with OP therapy. He has not had any swelling in his legs until today. He notes pain with inspiration and laying flat. He has been sleeping in a recliner. He has not had syncope. He notes 15 lbs weight gain over the past 3 weeks. He has not taken any extra Torsemide  and has been taking all of his CHF medications as prescribed. He had a syncopal episode a few days ago. He stood up, got dizzy and fell to the ground. He regained consciousness very quickly.  Inpatient Data:  EKG: V-paced, HR 130 K 3.4, creatinine 1.00, albumin 3, ALT 37 Hgb 13.3, PLT 115K A1c 6.7 hsTrop 125 >> 147 BNP  482.5 Lactic acid 5.4 >> 1.4 >> 0.9 Procalcitonin 0.7 Dig <0.6 A.m. cortisol 9.1 Respiratory panel negative Blood culture x 2: NG x 24 hours Chest CT: No acute aortic syndrome, right upper lobe opacity Most recent vitals: BP 128/72, MAP 88, HR 107, T 98.1, O2 98; I/O +2638.7L  CHF meds PTA Current Rx  Coreg  6.25 twice daily  Dig 0.125 Jardiance  10 Metolazone  2.5 weekly K 40 Entresto  24/26 twice daily  Spiro 25 Torsemide  40 twice daily Hep IV Novolog IV Protonix Zosyn Vanc Alb nebs      Past Medical History:  Diagnosis Date   Acute on chronic systolic congestive heart failure (HCC) 05/28/2020   Formatting of this note might be different from the original. IMO update 2021   Allergy    Since grade school age   Anemia    Not sure about this   Asthma 01/1992   Chest pain 05/28/2020   Congestive heart failure (CHF) (HCC) 07/21/2020   COPD (chronic obstructive pulmonary disease) (HCC) 2014   Depression 1993   Dilated cardiomyopathy (HCC) 07/21/2020   Dyspnea on exertion 07/21/2020   Essential hypertension 05/28/2020   Generalized anxiety disorder 05/28/2020   GERD (gastroesophageal reflux disease) 1991   Heart failure (HCC)    Heart murmur Birth   LBBB (left bundle branch block) 05/28/2020   Nausea and vomiting 05/28/2020   Obstructive sleep apnea 07/21/2020   Pneumonia due to COVID-19 virus 05/28/2020   Formatting of this note might be different from the original. Diagnosed by  SARS-CoV-2 PCR Cephid test at [facility] in [place], [place] on 05/28/2020.   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    Sleep apnea 2008   Substance abuse (HCC) 1996    Past Surgical History:  Procedure Laterality Date   BIV ICD INSERTION CRT-D N/A 03/24/2024   Procedure: BIV ICD INSERTION CRT-D;  Surgeon: Kennyth Chew, MD;  Location: [facility] INVASIVE CV LAB;  Service: Cardiovascular;  Laterality: N/A;   CARDIAC CATHETERIZATION  01/2023   NO PAST SURGERIES     RIGHT  HEART CATH N/A 10/16/2023   Procedure: RIGHT HEART CATH;  Surgeon: Cherrie Cameron SAUNDERS, MD;  Location: [facility] INVASIVE CV LAB;  Service: Cardiovascular;  Laterality: N/A;   RIGHT/LEFT HEART CATH AND CORONARY ANGIOGRAPHY N/A 12/27/2022   Procedure: RIGHT/LEFT HEART CATH AND CORONARY ANGIOGRAPHY;  Surgeon: Cherrie Cameron SAUNDERS, MD;  Location: [facility] INVASIVE CV LAB;  Service: Cardiovascular;  Laterality: N/A;       Scheduled Meds:  albuterol   2.5 mg Nebulization Q6H   heparin   5,000 Units Subcutaneous Q12H   insulin aspart  0-15 Units Subcutaneous Q4H   pantoprazole (PROTONIX) IV  40 mg Intravenous Q12H   Continuous Infusions:  sodium chloride  Stopped (07/03/24 2111)   norepinephrine (LEVOPHED) Adult infusion Stopped (07/03/24 1835)   piperacillin-tazobactam Stopped (07/04/24 1206)   vancomycin Stopped (07/04/24 0444)   PRN Meds: morphine injection  Allergies:    Allergies  Allergen Reactions   Acetaminophen  Itching and Rash    Social History:   Social History   Socioeconomic History   Marital status: Divorced    Spouse name: Not on file   Number of children: Not on file   Years of education: Not on file   Highest education level: GED or equivalent  Occupational History   Not on file  Tobacco Use   Smoking status: Former    Current packs/day: 0.00    Average packs/day: 1 pack/day for 15.0 years (15.0 ttl pk-yrs)    Types: Cigarettes, E-cigarettes    Quit date: 07/21/2013    Years since quitting: 10.9   Smokeless tobacco: Never   Tobacco comments:    Quit when I was told I had copd  Vaping Use   Vaping status: Never Used  Substance and Sexual Activity   Alcohol use: Not Currently    Comment: Never drank more than a drink with dinner maybe 20 times   Drug use: Not Currently    Types: Cocaine, Fentanyl , Heroin, Hydrocodone, Ketamine, Marijuana, MDMA (Ecstacy), Methamphetamines, Morphine, Oxycodone , Other-see comments    Comment: These are from over 20 years ago.   Sexual  activity: Not Currently    Birth control/protection: Abstinence, Condom, None  Other Topics Concern   Not on file  Social History Narrative   Not on file   Social Drivers of Health   Financial Resource Strain: Medium Risk (10/16/2023)   Overall Financial Resource Strain (CARDIA)    Difficulty of Paying Living Expenses: Somewhat hard  Food Insecurity: Food Insecurity Present (03/24/2024)   Hunger Vital Sign    Worried About Running Out of Food in the Last Year: Sometimes true    Ran Out of Food in the Last Year: Sometimes true  Transportation Needs: Unmet Transportation Needs (03/24/2024)   PRAPARE - Transportation    Lack of Transportation (Medical): Yes    Lack of Transportation (Non-Medical): Yes  Physical Activity: Unknown (10/16/2023)   Exercise Vital Sign    Days of Exercise per Week: 0  days    Minutes of Exercise per Session: Not on file  Stress: Stress Concern Present (10/16/2023)   [employer] of Occupational Health - Occupational Stress Questionnaire    Feeling of Stress : Very much  Social Connections: Socially Isolated (10/16/2023)   Social Connection and Isolation Panel    Frequency of Communication with Friends and Family: Once a week    Frequency of Social Gatherings with Friends and Family: Never    Attends Religious Services: Never    Database administrator or Organizations: No    Attends Engineer, structural: Not on file    Marital Status: Divorced  Intimate Partner Violence: Not At Risk (03/24/2024)   Humiliation, Afraid, Rape, and Kick questionnaire    Fear of Current or Ex-Partner: No    Emotionally Abused: No    Physically Abused: No    Sexually Abused: No    Family History:   Family History  Problem Relation Age of Onset   COPD Mother    Heart disease Mother    Arthritis Mother    Depression Mother    Obesity Mother    Heart attack Mother    Heart attack Father    Stroke Father    Alcohol abuse Father    Drug abuse Father    Bone  cancer Maternal Grandmother    Arthritis Maternal Grandmother    Diabetes Maternal Grandmother    Early death Maternal Grandmother    Obesity Maternal Grandmother    Varicose Veins Maternal Grandmother    Stomach cancer Maternal Grandfather    Cancer Maternal Grandfather    COPD Maternal Grandfather    ADD / ADHD Daughter    Alcohol abuse Brother    Depression Brother    Learning disabilities Brother    Obesity Brother    Asthma Sister    Depression Sister    Drug abuse Sister    Miscarriages / Stillbirths Sister    Obesity Sister    Cancer Maternal Uncle    Early death Maternal Uncle    Depression Maternal Aunt    Drug abuse Maternal Aunt    Miscarriages / Stillbirths Maternal Aunt      ROS:  Please see the history of present illness.  He has occasional hemorrhoidal bleeding All other ROS reviewed and negative.        Physical Exam/Data: Vitals:   07/04/24 1150 07/04/24 1200 07/04/24 1230 07/04/24 1334  BP: 102/71 111/72 128/72 (!) 122/93  Pulse: 97 94 95 (!) 106  Resp: 16 12 13 18   Temp:   98.1 F (36.7 C) 98.3 F (36.8 C)  TempSrc:   Oral Oral  SpO2: 98% 97% 98% 95%  Weight:    (!) 145.1 kg  Height:    6' 1 (1.854 m)    Intake/Output Summary (Last 24 hours) at 07/04/2024 1346 Last data filed at 07/04/2024 1206 Gross per 24 hour  Intake 3838.65 ml  Output 1200 ml  Net 2638.65 ml      07/04/2024    1:34 PM 07/03/2024    1:42 PM 04/30/2024    1:42 PM  Last 3 Weights  Weight (lbs) 319 lb 14.4 oz 315 lb 302 lb  Weight (kg) 145.106 kg 142.883 kg 136.986 kg     Body mass index is 42.21 kg/m.  General:  Well nourished, well developed, in no acute distress HEENT: normal Neck: no JVD Cardiac:  normal S1, S2; rapid regular rhythm; no murmur  Chest: +tender to palpation  Lungs:  decreased breath sounds, no rales  Abd: soft, nontender   Ext: 1+ bilat LE edema Musculoskeletal:  No deformities  Skin: warm and dry  Neuro:  CNs 2-12 intact, no focal  abnormalities noted Psych:  Normal affect   EKG:  The EKG was personally reviewed and demonstrates:  See HPI Telemetry:  Telemetry was personally reviewed and demonstrates:  sinus rhythm with synchronous v pacing    Laboratory Data: High Sensitivity Troponin:   Recent Labs  Lab 07/03/24 1355 07/03/24 1608  TROPONINIHS 125* 147*     Chemistry Recent Labs  Lab 07/03/24 1355 07/03/24 1431 07/03/24 2125 07/04/24 0839  NA 132* 133*  --  135  K 3.8 3.7  --  3.4*  CL 95* 93*  --  98  CO2 25  --   --  24  GLUCOSE 279* 218*  --  128*  BUN 13 15  --  15  CREATININE 1.17 1.10  --  1.00  CALCIUM 8.7*  --   --  8.0*  MG  --   --  1.7  --   GFRNONAA >60  --   --  >60  ANIONGAP 12  --   --  13    Recent Labs  Lab 07/03/24 1355  PROT 7.1  ALBUMIN 3.0*  AST 52*  ALT 37  ALKPHOS 64  BILITOT 1.1   Lipids No results for input(s): CHOL, TRIG, HDL, LABVLDL, LDLCALC, CHOLHDL in the last 168 hours.  Hematology Recent Labs  Lab 07/03/24 1355 07/03/24 1431 07/04/24 0839  WBC 10.3  --  9.7  RBC 4.73  --  4.41  HGB 14.2 14.6 13.3  HCT 43.6 43.0 43.1  MCV 92.2  --  97.7  MCH 30.0  --  30.2  MCHC 32.6  --  30.9  RDW 14.2  --  14.6  PLT 131*  --  115*   Thyroid  No results for input(s): TSH, FREET4 in the last 168 hours.  BNP Recent Labs  Lab 07/04/24 0839  BNP 482.5*    DDimer No results for input(s): DDIMER in the last 168 hours.  Radiology/Studies:  CT Angio Chest/Abd/Pel for Dissection W and/or Wo Contrast Result Date: 07/03/2024 CLINICAL DATA:  Acute aortic syndrome suspected. Shortness of breath, chest pain, recent pneumonia. EXAM: CT ANGIOGRAPHY CHEST, ABDOMEN AND PELVIS TECHNIQUE: Non-contrast CT of the chest was initially obtained. Multidetector CT imaging through the chest, abdomen and pelvis was performed using the standard protocol during bolus administration of intravenous contrast. Multiplanar reconstructed images and MIPs were obtained and  reviewed to evaluate the vascular anatomy. RADIATION DOSE REDUCTION: This exam was performed according to the departmental dose-optimization program which includes automated exposure control, adjustment of the mA and/or kV according to patient size and/or use of iterative reconstruction technique. CONTRAST:  OMNIPAQUE  IOHEXOL  350 MG/ML SOLN COMPARISON:  None Available. FINDINGS: CTA CHEST FINDINGS Cardiovascular: No aortic intramural hematoma on precontrast imaging nor dissection or aneurysm on postcontrast imaging. Heart is enlarged with left ventricular dilatation. No pericardial effusion. Enlarged pulmonic trunk. Mediastinum/Nodes: No pathologically enlarged mediastinal, hilar or axillary lymph nodes. Esophagus is grossly unremarkable. Lungs/Pleura: Patchy peribronchovascular ground-glass in the posterior segment right upper lobe. Tiny juxtapleural nodules are considered benign. Calcified granulomas. No pleural fluid. Airway is unremarkable. Musculoskeletal: Minimal degenerative change in the spine. Review of the MIP images confirms the above findings. CTA ABDOMEN AND PELVIS FINDINGS VASCULAR Aorta: No aneurysm or dissection.  Atherosclerotic calcification. Celiac: Widely patent. SMA: Widely patent. Renals:  Single bilaterally, widely patent. IMA: Widely patent. Inflow: Atherosclerotic calcification.  No aneurysm or dissection. Veins: Poorly evaluated due to lack of opacification. Review of the MIP images confirms the above findings. NON-VASCULAR Hepatobiliary: Liver is decreased in attenuation diffusely and is enlarged, 20.7 cm. Gallbladder is unremarkable. No biliary ductal dilatation. Pancreas: Negative. Spleen: Negative. Adrenals/Urinary Tract: Adrenal glands are unremarkable. Small low-attenuation lesions in the kidneys, too small to characterize. No specific follow-up necessary. Ureters are decompressed. Bladder is grossly unremarkable. Stomach/Bowel: Small hiatal hernia. Stomach, small bowel and colon  are otherwise unremarkable. Appendix is not readily visualized. Lymphatic: No pathologically enlarged lymph nodes. Reproductive: Prostate is normal in size. Other: Small bilateral inguinal hernias contain fat. No free fluid. Mesenteries and peritoneum are unremarkable. Musculoskeletal: Minimal degenerative change in the spine. Vague sclerosis in the superior aspect of the L4 vertebral body, nonspecific. Review of the MIP images confirms the above findings. IMPRESSION: 1. No evidence of acute aortic syndrome or other cause of the patient's clinical history. 2. Small area of peribronchovascular ground-glass in the right upper lobe, infectious/inflammatory in etiology. 3. Cardiac enlargement with left ventricular dilatation. 4. Steatotic enlarged liver. 5. Enlarged pulmonic trunk, indicative of pulmonary arterial hypertension. 6.  Aortic atherosclerosis (ICD10-I70.0). Electronically Signed   By: Newell Eke M.D.   On: 07/03/2024 15:48   DG Chest Port 1 View Result Date: 07/03/2024 CLINICAL DATA:  Questionable sepsis. EXAM: PORTABLE CHEST 1 VIEW COMPARISON:  Chest radiograph dated 03/25/2024. FINDINGS: No focal consolidation, pleural effusion, or pneumothorax. Mild cardiomegaly. Left pectoral pacemaker device. No acute osseous pathology. IMPRESSION: 1. No active disease. 2. Mild cardiomegaly. Electronically Signed   By: Vanetta Chou M.D.   On: 07/03/2024 14:20   .   Assessment and Plan: 1. (HFrEF) heart failure with reduced ejection fraction  Non-ischemic cardiomyopathy. EF 15. He is s/p recent CRT-D in July 2025. He is admitted with sepsis and evidence of probable pneumonia on CT. He also has severe back pain and scan are pending to further define source of infection. He was initially placed on vasopressors due to hypotension. He is now off NE and IVFs. His BP is stable. Cardiology is asked to managed CHF. He has some evidence of peripheral edema now. We will follow along to slowly add back CHF meds as  BP will allow. His legs are swollen now and his I/Os are very positive. Since his BP is better, will give him some gentle diuresis to maintain balance. His elevated Troponins are flat and most c/w demand ischemia. - Continue Digoxin  0.125 mg once daily - Lasix  IV 20 mg x 1  - Will follow along with you.  - Echocardiogram pending  2. Tachycardia Pt is s/p CRT-D. He is pacing in the 130s. Question if he has underlying AFlutter or ATach. Dr. Waddell (EP) reviewed his current telemetry which demonstrates sinus rhythm with synchronous ventricular pacing. Elevated rates likely due to sepsis.  3. Sepsis Per primary service.    Risk Assessment/Risk Scores:       [place]  Heart Association (NYHA) Functional Class NYHA Class III     For questions or updates, please contact [facility] Please consult [email or web address] for contact info under      Signed, Glendia Ferrier, PA-C  07/04/2024 1:46 PM

## 2024-07-04 NOTE — ED Notes (Signed)
Attending provider at bedside

## 2024-07-04 NOTE — Progress Notes (Signed)
 RT asked patient if he would like to wear a cpap tonight due to order for cpap. Patient states he would like to wait until tomorrow night to start cpap. RT informed patient he can call if he feels like he needs one tonight.

## 2024-07-04 NOTE — Progress Notes (Signed)
 RT arrived beside after a call from RN stating patient needs bipap. Rapid at bedside (see rapid note for details). RT placed patient on bipap. Once patient became more alert he stated feeling like he was choking and did not want to wear the bipap. RT spoke with RN and RN returned bedside with RT when patient took bipap off. The bipap seemed to make the patient anxious. Bipap is currently in stand-by due to patient not wanting to continue to wear it at this time.

## 2024-07-04 NOTE — Progress Notes (Signed)
 PROGRESS NOTE  Cameron Jenkins  DOB: 1976/07/16  PCP: Patient, No Pcp Per FMW:[id number]  DOA: 07/03/2024  LOS: 1 day  Hospital Day: 2  Subjective: Patient was seen and examined this morning. Pleasant middle-aged Caucasian male.  Looks older for his stated age. Propped up in bed.  On 3 L oxygen. Not in pain currently.  No family at bedside. Chart reviewed.  Overnight, no recurrence of fever.  Heart rate in 80s and 90s, blood pressure in low 100s mostly  Brief narrative: Cameron Jenkins is a 48 y.o. male with PMH significant for severe systolic CHF with EF 15 % due to nonischemic cardiomyopathy, PEA arrest, morbid obesity, OSA, HTN, COPD on 2 L O2, anxiety, h/o polysubstance abuse, chronic skin rashes . 10/18, patient presented to ED with complaint of shortness of breath, chest pain, fever. Reports symptoms for several days, went to urgent care on 10/17, diagnosed to have pneumonia and discharged on antibiotics and steroids.  Symptoms worsen at home with fever up to 101.3 and hence came to ED on 10/18.  In the ED, patient had a fever of 102.4, heart rate elevated to 120s and 140s, initial blood pressure was in 100s.  Remained on 2 L oxygen which is his baseline Labs with WC count normal, hemoglobin normal, platelet 131, sodium 132, glucose 279, troponin elevated to 125 > 147, lactate normal Blood culture collected.  Started on broad spec antibiotics EKG with sinus tachycardia at 134 bpm, QTc 448 ms, no ischemic changes. CT angio chest, abdomen and pelvis were obtained.  Showed small area of peribronchovascular ground glass in the RUL, fatty liver, LV dilatation  While in the ED, blood pressure dipped down to 80s. Levophed was started. PCCM was consulted. By the time of PCCM evaluation, blood pressure improved.  Levophed was held, blood pressure remained stable and hence patient did not require to go to ICU. Admitted to [facility] Cardiology consulted  Assessment and plan: Septic shock -  POA RUL pneumonia Presented with shortness of breath, chest pain, fever Had fever 102.7 in the ED.  Lactic acid was elevated to 5.4 with drop in blood pressure. Required Levophed initially.  Later stopped. Lactic acid resolved.  WBC count remains normal.  Procalcitonin level elevated to 0.7 No recurrence of fever. Follow-up blood culture. Continue broad-spectrum antibiotics for now. Recent Labs  Lab 07/03/24 1355 07/03/24 1403 07/03/24 1620 07/03/24 2238 07/04/24 0839  WBC 10.3  --   --   --  9.7  LATICACIDVEN  --  5.4* 1.4 0.9  --   PROCALCITON  --   --   --   --  0.70   Chronic severe systolic CHF Nonischemic cardiomyopathy s/p AICD s/p CRT-D Had extensive cardiac workup in the past.  Most recent echo about 15% No evidence of volume overload so far.  BNP elevated at 482 Repeat Echo requested Cardiology consult requested PTA meds Coreg  6.25 mg twice daily, digoxin  0.125 mg daily, Entresto  24-26 mg twice daily, Jardiance  10 mg daily, Aldactone  25 mg daily, torsemide  40 mg twice daily, metolazone  as needed, Per cardiology recommendation, received a dose of IV Lasix  20 mg and resumed on digoxin  0.125 mg daily.  Other medicines remain on hold Continue to monitor for daily intake output, weight, blood pressure, BNP, renal function and electrolytes. Net IO Since Admission: 2,638.65 mL [07/04/24 1406] Recent Labs  Lab 07/03/24 1355 07/03/24 1403 07/03/24 1431 07/03/24 1620 07/03/24 2125 07/03/24 2238 07/04/24 0839  BNP  --   --   --   --   --   --  482.5*  LATICACIDVEN  --  5.4*  --  1.4  --  0.9  --   BUN 13  --  15  --   --   --  15  CREATININE 1.17  --  1.10  --   --   --  1.00  NA 132*  --  133*  --   --   --  135  K 3.8  --  3.7  --   --   --  3.4*  MG  --   --   --   --  1.7  --   --    Elevated troponin Troponin elevated to 147. Acute ischemia versus LV strain from CHF Repeat troponin.  Obtain echo to look for [facility] Recent Labs    07/03/24 1355 07/03/24 1608  07/03/24 2125  CKTOTAL  --   --  96  TROPONINIHS 125* 147*  --    COPD Chronic hypoxic respiratory failure  On 2 L oxygen at baseline Continue bronchodilators  Type 2 diabetes mellitus with hyperglycemia A1c 6.7 on 07/03/2024  PTA meds-Jardiance  I believe blood sugar level was elevated on admission due to Medrol Dosepak that was started the day before Currently on SSI/Accu-Cheks Recent Labs  Lab 07/03/24 1945 07/03/24 2346 07/04/24 0335 07/04/24 0745 07/04/24 1149  GLUCAP 204* 170* 142* 139* 191*    Low back pain PRN pain meds  Anxiety,  h/o polysubstance abuse  Morbid Obesity  Body mass index is 42.21 kg/m. Patient has been advised to make an attempt to improve diet and exercise patterns to aid in weight loss.   Mobility: Encourage ambulation PT Orders:   PT Follow up Rec:    Goals of care   Code Status: Full Code     DVT prophylaxis:  heparin  injection 5,000 Units Start: 07/03/24 2200   Antimicrobials: IV Zosyn and IV vancomycin Fluid: None Consultants: Cardiology Family Communication: None at bedside  Status: Inpatient Level of care:  Progressive   Patient is from: Home Needs to continue in-hospital care: Ongoing workup and management Anticipated d/c to: Pending clinical course, hopefully home in 2 to 3 days    Diet:  Diet Order             Diet Heart Room service appropriate? Yes; Fluid consistency: Thin  Diet effective now                   Scheduled Meds:  albuterol   2.5 mg Nebulization Q6H   digoxin   0.125 mg Oral Daily   furosemide   20 mg Intravenous Once   heparin   5,000 Units Subcutaneous Q12H   insulin aspart  0-15 Units Subcutaneous Q4H   pantoprazole (PROTONIX) IV  40 mg Intravenous Q12H   potassium chloride   40 mEq Oral Q4H    PRN meds: morphine injection   Infusions:   sodium chloride  Stopped (07/03/24 2111)   magnesium  sulfate bolus IVPB     norepinephrine (LEVOPHED) Adult infusion Stopped (07/03/24 1835)    piperacillin-tazobactam 3.375 g (07/04/24 1350)   vancomycin Stopped (07/04/24 0444)    Antimicrobials: Anti-infectives (From admission, onward)    Start     Dose/Rate Route Frequency Ordered Stop   07/04/24 0300  vancomycin (VANCOREADY) IVPB 1500 mg/300 mL        1,500 mg 150 mL/hr over 120 Minutes Intravenous Every 12 hours 07/03/24 2059     07/03/24 2200  piperacillin-tazobactam (ZOSYN) IVPB 3.375 g        3.375 g  12.5 mL/hr over 240 Minutes Intravenous Every 8 hours 07/03/24 1917     07/03/24 1400  vancomycin (VANCOREADY) IVPB 2000 mg/400 mL        2,000 mg 200 mL/hr over 120 Minutes Intravenous  Once 07/03/24 1347 07/03/24 1717   07/03/24 1345  vancomycin (VANCOCIN) IVPB 1000 mg/200 mL premix  Status:  Discontinued        1,000 mg 200 mL/hr over 60 Minutes Intravenous  Once 07/03/24 1343 07/03/24 1346   07/03/24 1345  ceFEPIme (MAXIPIME) 2 g in sodium chloride  0.9 % 100 mL IVPB        2 g 200 mL/hr over 30 Minutes Intravenous  Once 07/03/24 1343 07/03/24 1444       Objective: Vitals:   07/04/24 1230 07/04/24 1334  BP: 128/72 (!) 122/93  Pulse: 95 (!) 106  Resp: 13 18  Temp: 98.1 F (36.7 C) 98.3 F (36.8 C)  SpO2: 98% 95%    Intake/Output Summary (Last 24 hours) at 07/04/2024 1406 Last data filed at 07/04/2024 1206 Gross per 24 hour  Intake 3838.65 ml  Output 1200 ml  Net 2638.65 ml   Filed Weights   07/03/24 1342 07/04/24 1334  Weight: (!) 142.9 kg (!) 145.1 kg   Weight change:  Body mass index is 42.21 kg/m.   Physical Exam: General exam: Pleasant, middle-aged Caucasian male.  Looks older for his age. Skin: No rashes, lesions or ulcers. HEENT: Atraumatic, normocephalic, no obvious bleeding Lungs: Clear to auscultation bilaterally, diminished air entry in both bases CVS: S1, S2, no murmur,   GI/Abd: Soft, nontender, nondistended, bowel sound present,   CNS: Alert, awake, oriented X3 Psychiatry: Sad affect Extremities: Trace to 1+ bilateral pedal  edema, no calf tenderness,   Data Review: I have personally reviewed the laboratory data and studies available.  F/u labs ordered Unresulted Labs (From admission, onward)     Start     Ordered   07/05/24 0500  Basic metabolic panel with GFR  Tomorrow morning,   R        07/04/24 0733   07/05/24 0500  CBC with Differential/Platelet  Tomorrow morning,   R        07/04/24 0733   07/04/24 1341  MRSA Next Gen by PCR, Nasal  Once,   R        07/04/24 1340            Signed, Chapman Rota, MD [facility] 07/04/2024

## 2024-07-04 NOTE — Significant Event (Addendum)
 Rapid Response Event Note   Reason for Call :  Respiratory distress, chest pain, back pain  Initial Focused Assessment:  Pt lying in bed, AO. Skin is warm, flush. Patient endorses 8/10 low back pain. Pt endorses sensation of weight on his chest. Patient endorses chest pain, which worsens with palpation of patient's sternum. Pt intermittently holding breath. Abdomen is round, distended. Patient states he doesn't want to die in the hospital.  1mg  IV Dilaudid being administered for pain. EKG completed.   VS: BP 117/72, HR 115, RR 32, SpO2 96% on 3LNC  Interventions:  -Ice packs to chest  Plan of Care:  -Lidocaine  patch, Robaxin  for pain -Xanax  for anxiety -CT thoracic and lumbar ordered  -Pt endorses using CPAP at night   Event Summary:  MD Notified: Dr. Sonjia at bedside Call Time: 1726 Arrival Time: 1726 End Time: 1745  Leonor LITTIE Danker, RN

## 2024-07-04 NOTE — Progress Notes (Signed)
 Patient stated having 9/10 crushing chest pain and stating he cannot breathe. HR 120s, bp 118/90, RR 25 and diaphoretic.Notified Rapid Response team and attending. Both at bedside. Got EKG, gave iv dilaudid. See new orders.

## 2024-07-04 NOTE — ED Notes (Signed)
 CCMD notified of pt's monitoring order.

## 2024-07-04 NOTE — Significant Event (Signed)
 Rapid Response Event Note   Reason for Call : Decreased LOC  Initial Focused Assessment:  I was notified by nursing staff regarding somulent pt following Benadryl. CBG 176 recently. Pt with known OSA, Obesity and multiple other comorbities. Pt seen earlier this afternoon and was reportedly very anxious.  Pt now with snoring respirations on a NRB mask at 15L, not responding to noxious stimuli, but spontaneously moving eyebrows and hands intermittently. Pt placed on BIPAP due to OSA. Dr. Shona came to bedside. Narcan 0.2mg  IV given. Pt woke and was able to speak.   2115-100.8F, HR 117, 129/96 (102), RR 12 with sats 96% on NRB mask at 15L.    Interventions:  -Bipap -Narcan per MD order    MD Notified: Dr. Shona Call Time: 2110 Arrival Time: 2112 End Time: 2145  Griselda Alm ORN, RN

## 2024-07-04 NOTE — Progress Notes (Signed)
   07/04/24 1536  Assess: MEWS Score  Temp (!) 100.5 F (38.1 C)  BP 95/75  MAP (mmHg) 80  Pulse Rate (!) 107  ECG Heart Rate (!) 112  Resp (!) 22  SpO2 98 %  Assess: MEWS Score  MEWS Temp 1  MEWS Systolic 1  MEWS Pulse 2  MEWS RR 1  MEWS LOC 0  MEWS Score 5  MEWS Score Color Red  Assess: if the MEWS score is Yellow or Red  Were vital signs accurate and taken at a resting state? Yes  Does the patient meet 2 or more of the SIRS criteria? Yes  Does the patient have a confirmed or suspected source of infection? No  MEWS guidelines implemented  Yes, red  Treat  MEWS Interventions Considered administering scheduled or prn medications/treatments as ordered  Take Vital Signs  Increase Vital Sign Frequency  Red: Q1hr x2, continue Q4hrs until patient remains green for 12hrs  Escalate  MEWS: Escalate Red: Discuss with charge nurse and notify provider. Consider notifying RRT. If remains red for 2 hours consider need for higher level of care  Notify: Charge Nurse/RN  Name of Charge Nurse/RN Notified Lauraine Pack  Provider Notification  Provider Name/Title Arlice Reichert MD  Date Provider Notified 07/04/24  Time Provider Notified 1600  Method of Notification Page  Notification Reason Other (Comment) (Red mews)  Provider response See new orders  Assess: SIRS CRITERIA  SIRS Temperature  0  SIRS Respirations  1  SIRS Pulse 1  SIRS WBC 0  SIRS Score Sum  2

## 2024-07-05 ENCOUNTER — Ambulatory Visit: Payer: MEDICAID | Attending: Pulmonary Disease | Admitting: Pulmonary Disease

## 2024-07-05 ENCOUNTER — Inpatient Hospital Stay (HOSPITAL_COMMUNITY): Payer: MEDICAID

## 2024-07-05 DIAGNOSIS — R6520 Severe sepsis without septic shock: Secondary | ICD-10-CM

## 2024-07-05 DIAGNOSIS — I428 Other cardiomyopathies: Secondary | ICD-10-CM

## 2024-07-05 DIAGNOSIS — I5023 Acute on chronic systolic (congestive) heart failure: Secondary | ICD-10-CM | POA: Diagnosis not present

## 2024-07-05 DIAGNOSIS — I517 Cardiomegaly: Secondary | ICD-10-CM

## 2024-07-05 DIAGNOSIS — I447 Left bundle-branch block, unspecified: Secondary | ICD-10-CM

## 2024-07-05 DIAGNOSIS — A419 Sepsis, unspecified organism: Secondary | ICD-10-CM | POA: Diagnosis not present

## 2024-07-05 DIAGNOSIS — I5022 Chronic systolic (congestive) heart failure: Secondary | ICD-10-CM

## 2024-07-05 DIAGNOSIS — Z95810 Presence of automatic (implantable) cardiac defibrillator: Secondary | ICD-10-CM

## 2024-07-05 DIAGNOSIS — J9600 Acute respiratory failure, unspecified whether with hypoxia or hypercapnia: Secondary | ICD-10-CM | POA: Diagnosis not present

## 2024-07-05 LAB — CBC WITH DIFFERENTIAL/PLATELET
Abs Immature Granulocytes: 0.04 K/uL (ref 0.00–0.07)
Basophils Absolute: 0 K/uL (ref 0.0–0.1)
Basophils Relative: 0 %
Eosinophils Absolute: 0.2 K/uL (ref 0.0–0.5)
Eosinophils Relative: 3 %
HCT: 44.2 % (ref 39.0–52.0)
Hemoglobin: 14.4 g/dL (ref 13.0–17.0)
Immature Granulocytes: 1 %
Lymphocytes Relative: 19 %
Lymphs Abs: 1.3 K/uL (ref 0.7–4.0)
MCH: 29.7 pg (ref 26.0–34.0)
MCHC: 32.6 g/dL (ref 30.0–36.0)
MCV: 91.1 fL (ref 80.0–100.0)
Monocytes Absolute: 1.2 K/uL — ABNORMAL HIGH (ref 0.1–1.0)
Monocytes Relative: 17 %
Neutro Abs: 4.2 K/uL (ref 1.7–7.7)
Neutrophils Relative %: 60 %
Platelets: 157 K/uL (ref 150–400)
RBC: 4.85 MIL/uL (ref 4.22–5.81)
RDW: 14.5 % (ref 11.5–15.5)
WBC: 6.9 K/uL (ref 4.0–10.5)
nRBC: 0 % (ref 0.0–0.2)

## 2024-07-05 LAB — BLOOD GAS, ARTERIAL
Acid-Base Excess: 1.2 mmol/L (ref 0.0–2.0)
Bicarbonate: 30.3 mmol/L — ABNORMAL HIGH (ref 20.0–28.0)
Drawn by: 13746
O2 Saturation: 100 %
Patient temperature: 36.7
pCO2 arterial: 68 mmHg (ref 32–48)
pH, Arterial: 7.25 — ABNORMAL LOW (ref 7.35–7.45)
pO2, Arterial: 334 mmHg — ABNORMAL HIGH (ref 83–108)

## 2024-07-05 LAB — ECHOCARDIOGRAM COMPLETE
AR max vel: 2.12 cm2
AV Mean grad: 3.3 mmHg
AV Peak grad: 4.3 mmHg
Ao pk vel: 1.04 m/s
Est EF: <20
S' Lateral: 6.4 cm

## 2024-07-05 LAB — BASIC METABOLIC PANEL WITH GFR
Anion gap: 10 (ref 5–15)
BUN: 13 mg/dL (ref 6–20)
CO2: 24 mmol/L (ref 22–32)
Calcium: 8.2 mg/dL — ABNORMAL LOW (ref 8.9–10.3)
Chloride: 100 mmol/L (ref 98–111)
Creatinine, Ser: 1.13 mg/dL (ref 0.61–1.24)
GFR, Estimated: >60 mL/min (ref >60)
Glucose, Bld: 114 mg/dL — ABNORMAL HIGH (ref 70–99)
Potassium: 4.2 mmol/L (ref 3.5–5.1)
Sodium: 134 mmol/L — ABNORMAL LOW (ref 135–145)

## 2024-07-05 LAB — GLUCOSE, CAPILLARY
Glucose-Capillary: 92 mg/dL (ref 70–99)
Glucose-Capillary: 142 mg/dL — ABNORMAL HIGH (ref 70–99)
Glucose-Capillary: 189 mg/dL — ABNORMAL HIGH (ref 70–99)
Glucose-Capillary: 191 mg/dL — ABNORMAL HIGH (ref 70–99)

## 2024-07-05 LAB — TROPONIN I (HIGH SENSITIVITY)
Troponin I (High Sensitivity): 57 ng/L — ABNORMAL HIGH (ref <18)
Troponin I (High Sensitivity): 46 ng/L — ABNORMAL HIGH (ref <18)

## 2024-07-05 LAB — MAGNESIUM: Magnesium: 2.3 mg/dL (ref 1.7–2.4)

## 2024-07-05 MED ORDER — ALBUTEROL SULFATE (2.5 MG/3ML) 0.083% IN NEBU: 2.5000 mg | INHALATION_SOLUTION | Freq: Four times a day (QID) | RESPIRATORY_TRACT | Status: DC | PRN

## 2024-07-05 MED ORDER — HYDROMORPHONE HCL 1 MG/ML IJ SOLN: 0.5000 mg | INTRAMUSCULAR | Status: DC | PRN

## 2024-07-05 MED ORDER — POTASSIUM CHLORIDE CRYS ER 20 MEQ PO TBCR
40.0000 meq | EXTENDED_RELEASE_TABLET | Freq: Once | ORAL | Status: AC
  Administered 2024-07-05: 40 meq via ORAL
  Filled 2024-07-05: qty 2

## 2024-07-05 MED ORDER — FUROSEMIDE 10 MG/ML IJ SOLN
80.0000 mg | Freq: Four times a day (QID) | INTRAMUSCULAR | Status: DC
  Administered 2024-07-05: 80 mg via INTRAVENOUS
  Filled 2024-07-05: qty 8

## 2024-07-05 MED ORDER — SPIRONOLACTONE 12.5 MG HALF TABLET
12.5000 mg | ORAL_TABLET | Freq: Every day | ORAL | Status: DC
  Administered 2024-07-05: 12.5 mg via ORAL
  Filled 2024-07-05: qty 1

## 2024-07-05 MED ORDER — SACUBITRIL-VALSARTAN 24-26 MG PO TABS
1.0000 | ORAL_TABLET | Freq: Two times a day (BID) | ORAL | Status: DC
Start: 2024-07-05 — End: 2024-07-05
  Administered 2024-07-05: 1 via ORAL
  Filled 2024-07-05: qty 1

## 2024-07-05 MED ORDER — EMPAGLIFLOZIN 10 MG PO TABS
10.0000 mg | ORAL_TABLET | Freq: Every day | ORAL | Status: DC
  Administered 2024-07-05: 10 mg via ORAL
  Filled 2024-07-05: qty 1

## 2024-07-05 MED ORDER — PERFLUTREN LIPID MICROSPHERE
1.0000 mL | INTRAVENOUS | Status: AC | PRN
  Administered 2024-07-05: 2 mL via INTRAVENOUS

## 2024-07-05 NOTE — Progress Notes (Signed)
 PROGRESS NOTE  Cameron Jenkins  DOB: 1976/04/27  PCP: Patient, No Pcp Per FMW:[id number]  DOA: 07/03/2024  LOS: 2 days  Hospital Day: 3  Subjective: Patient was seen and examined this morning. Was unresponsive and rapid response called. Please look at the rapid response note from this morning. ABG showed acidotic pH at 7.25 with pCO2 elevated to 68.  Initiated on BiPAP Seen again later this morning.  Alert, awake, oriented x 3.  Off BiPAP to nasal cannula.  Jokes that he was probably just taking a nap Tachycardic in 100s, Labs showed normal renal function, sodium 134, troponin 57, CBC unremarkable  Brief narrative: Cameron Jenkins is a 48 y.o. male with PMH significant for severe systolic CHF with EF 15 % due to nonischemic cardiomyopathy, PEA arrest, morbid obesity, OSA, HTN, COPD on 2 L O2, anxiety, h/o polysubstance abuse, chronic back pain. 10/18, patient presented to ED with complaint of shortness of breath, chest pain, fever. Reports symptoms for several days, went to urgent care on 10/17, diagnosed to have pneumonia and discharged on antibiotics and steroids.  Symptoms worsen at home with fever up to 101.3 and hence came to ED on 10/18.  In the ED, patient had a fever of 102.4, heart rate elevated to 120s and 140s, initial blood pressure was in 100s.  Remained on 2 L oxygen which is his baseline Labs with WC count normal, hemoglobin normal, platelet 131, sodium 132, glucose 279, troponin elevated to 125 > 147, lactate normal Blood culture collected.  Started on broad spec antibiotics EKG with sinus tachycardia at 134 bpm, QTc 448 ms, no ischemic changes. CT angio chest, abdomen and pelvis were obtained.  Showed small area of peribronchovascular ground glass in the RUL, fatty liver, LV dilatation  While in the ED, blood pressure dipped down to 80s. Levophed was started. PCCM was consulted. By the time of PCCM evaluation, blood pressure improved.  Levophed was held, blood pressure  remained stable and hence patient did not require to go to ICU. Admitted to [facility] Cardiology was consulted  Assessment and plan: Septic shock - POA RUL pneumonia Presented with shortness of breath, chest pain, fever Had fever 102.7 in the ED.  Lactic acid was elevated to 5.4 with drop in blood pressure. Required Levophed for less than an hour in the ED. Lactic acid resolved.  WBC count remains normal.  Procalcitonin level elevated to 0.7 No recurrence of fever. No growth in blood culture so far. Continue broad-spectrum antibiotics for now. Recent Labs  Lab 07/03/24 1355 07/03/24 1403 07/03/24 1620 07/03/24 2238 07/04/24 0839 07/05/24 0923  WBC 10.3  --   --   --  9.7 6.9  LATICACIDVEN  --  5.4* 1.4 0.9  --   --   PROCALCITON  --   --   --   --  0.70  --    Chronic severe systolic CHF Nonischemic cardiomyopathy s/p AICD s/p CRT-D Had extensive cardiac workup in the past.   Presented with bilateral lower extremity edema BNP elevated at 482 Repeat Echo today showed EF less than 20% Cardiology consult obtained.  Heart failure service is following today Noted plan to start on diuresis with Lasix  IV 80 mg twice daily, TED hoses PTA meds Coreg  6.25 mg twice daily, digoxin  0.125 mg daily, Entresto  24-26 mg twice daily, Jardiance  10 mg daily, Aldactone  25 mg daily, torsemide  40 mg twice daily, metolazone  as needed, Currently on digoxin  0.125 mg daily.  Noted that cardiology has resumed Entresto , Aldactone   and Jardiance  today.  Coreg  on hold RHC tomorrow Continue to monitor for daily intake output, weight, blood pressure, BNP, renal function and electrolytes. Net IO Since Admission: 1,587.77 mL [07/05/24 1403] Recent Labs  Lab 07/03/24 1355 07/03/24 1403 07/03/24 1431 07/03/24 1620 07/03/24 2125 07/03/24 2238 07/04/24 0839 07/05/24 0923 07/05/24 1246  BNP  --   --   --   --   --   --  482.5*  --   --   LATICACIDVEN  --  5.4*  --  1.4  --  0.9  --   --   --   BUN 13  --  15  --    --   --  15 13  --   CREATININE 1.17  --  1.10  --   --   --  1.00 1.13  --   NA 132*  --  133*  --   --   --  135 134*  --   K 3.8  --  3.7  --   --   --  3.4* 4.2  --   MG  --   --   --   --  1.7  --   --   --  2.3   Elevated troponin Troponin was elevated to 147. Acute ischemia versus LV strain from CHF. Cardiology following Pending echo today Recent Labs    07/03/24 1355 07/03/24 1608 07/03/24 2125 07/05/24 0923 07/05/24 1246  CKTOTAL  --   --  96  --   --   TROPONINIHS 125* 147*  --  57* 46*   Acute on chronic hypoxic respiratory failure  COPD, OSA Patient was on on 2 L oxygen at baseline.  Seems to be inconsistently compliant to CPAP. This morning, patient was unresponsive. ABG showed acidotic pH at 7.25 with pCO2 elevated to 68.  Initiated on BiPAP. Acute respiratory compensation secondary to pneumonia, polypharmacy, noncompliance to CPAP Mental status improving with BiPAP. Unable to tolerate hospital CPAP.  He has been asked to bring his old device from home by somebody Continue bronchodilators  Acute metabolic encephalopathy Opiate overdose Secondary to pain meds last night and hypercapnia this morning.  Needed Narcan last night with improvement in mental status  Altered again this morning because of hypercapnia.  Improving with BiPAP.   Continue to monitor mental status  Type 2 diabetes mellitus with hyperglycemia A1c 6.7 on 07/03/2024  PTA meds-Jardiance  I believe blood sugar level was elevated on admission due to Medrol Dosepak that was started the day before Currently on SSI/Accu-Cheks.  Blood sugar level in acceptable range Recent Labs  Lab 07/04/24 1949 07/04/24 2325 07/05/24 0340 07/05/24 0813 07/05/24 1052  GLUCAP 176* 119* 92 142* 189*    Acute on chronic back pain Anxiety h/o polysubstance abuse Patient has chronic back pain.  Complaint of severe worsening of back pain yesterday along with generalized tenderness on chest wall. MRI could not  be obtained because of dual-chamber pacemaker in place. CT thoracic spine and lumbar spine were obtained.  Did not show any acute findings.  Showed mild degenerative disc disease at L4-L5 and L5-S1 with resultant mild to moderate spinal stenosis at L4-L5.  No significant finding in thoracic level. Minimize the use of IV opiates. Pain regimen --- PRN: Tylenol , Dilaudid  Morbid Obesity  Body mass index is 42.21 kg/m. Patient has been advised to make an attempt to improve diet and exercise patterns to aid in weight loss.   Mobility: Encourage ambulation PT  Orders:   PT Follow up Rec:    Goals of care   Code Status: Full Code     DVT prophylaxis:  Place TED hose Start: 07/05/24 1249 heparin  injection 5,000 Units Start: 07/03/24 2200   Antimicrobials: IV Zosyn and IV vancomycin Fluid: None Consultants: Cardiology Family Communication: None at bedside  Status: Inpatient Level of care:  Progressive   Patient is from: Home Needs to continue in-hospital care: Ongoing workup and management Anticipated d/c to: Pending clinical course, hopefully home in 2 to 3 days    Diet:  Diet Order             Diet Heart Room service appropriate? Yes; Fluid consistency: Thin  Diet effective now                   Scheduled Meds:  digoxin   0.125 mg Oral Daily   empagliflozin   10 mg Oral Daily   furosemide   80 mg Intravenous Q6H   heparin   5,000 Units Subcutaneous Q12H   insulin aspart  0-15 Units Subcutaneous Q4H   lidocaine   1 patch Transdermal Q24H   sacubitril -valsartan   1 tablet Oral BID   spironolactone   12.5 mg Oral Daily    PRN meds: acetaminophen , albuterol , HYDROmorphone (DILAUDID) injection, hydrOXYzine, melatonin, menthol, nitroGLYCERIN, polyethylene glycol, prochlorperazine   Infusions:   piperacillin-tazobactam 3.375 g (07/05/24 0708)   vancomycin 1,500 mg (07/05/24 0409)    Antimicrobials: Anti-infectives (From admission, onward)    Start     Dose/Rate  Route Frequency Ordered Stop   07/04/24 0300  vancomycin (VANCOREADY) IVPB 1500 mg/300 mL        1,500 mg 150 mL/hr over 120 Minutes Intravenous Every 12 hours 07/03/24 2059     07/03/24 2200  piperacillin-tazobactam (ZOSYN) IVPB 3.375 g        3.375 g 12.5 mL/hr over 240 Minutes Intravenous Every 8 hours 07/03/24 1917     07/03/24 1400  vancomycin (VANCOREADY) IVPB 2000 mg/400 mL        2,000 mg 200 mL/hr over 120 Minutes Intravenous  Once 07/03/24 1347 07/03/24 1717   07/03/24 1345  vancomycin (VANCOCIN) IVPB 1000 mg/200 mL premix  Status:  Discontinued        1,000 mg 200 mL/hr over 60 Minutes Intravenous  Once 07/03/24 1343 07/03/24 1346   07/03/24 1345  ceFEPIme (MAXIPIME) 2 g in sodium chloride  0.9 % 100 mL IVPB        2 g 200 mL/hr over 30 Minutes Intravenous  Once 07/03/24 1343 07/03/24 1444       Objective: Vitals:   07/05/24 0954 07/05/24 1055  BP:  (!) 91/57  Pulse: (!) 113 (!) 103  Resp: 18 19  Temp:  97.7 F (36.5 C)  SpO2:  97%    Intake/Output Summary (Last 24 hours) at 07/05/2024 1403 Last data filed at 07/05/2024 0347 Gross per 24 hour  Intake 449.12 ml  Output 600 ml  Net -150.88 ml   Filed Weights   07/03/24 1342 07/04/24 1334  Weight: (!) 142.9 kg (!) 145.1 kg   Weight change: 2.223 kg Body mass index is 42.21 kg/m.   Physical Exam this morning: General exam: Pleasant, middle-aged Caucasian male. Looks older for his age. Skin: No rashes, lesions or ulcers.  Generalized rashes and small nonhealing scabbed wounds all over the body HEENT: Atraumatic, normocephalic, no obvious bleeding Lungs: Clear to auscultation bilaterally, diminished air entry in both bases CVS: S1, S2, no murmur,   GI/Abd: Soft,  nontender, nondistended, bowel sound present,   CNS: Alert, awake, oriented X3 Psychiatry: Mood reported Extremities: 1+ bilateral pedal edema, no calf tenderness,   Data Review: I have personally reviewed the laboratory data and studies  available.  F/u labs ordered Unresulted Labs (From admission, onward)     Start     Ordered   07/06/24 0500  Magnesium   Tomorrow morning,   R       Question:  Specimen collection method  Answer:  IV Team=IV Team collect   07/05/24 1230            Signed, Chapman Rota, MD [facility] 07/05/2024

## 2024-07-05 NOTE — Progress Notes (Signed)
 Upon morning report/rounds patient seen in recliner chair with eyes closed and not responding to commands. Patient only waking up with physical touch and loud verbal stimulation. Oxygen saturations were in the 70s. Called respiratory and rapid response. Paged Attending and came to bedside. Patient back in bed and placed on bipap. ABG drawn by respiratory. Will continue to monitor patient.

## 2024-07-05 NOTE — TOC Initial Note (Signed)
 Transition of Care [facility]) - Initial/Assessment Note    Patient Details  Name: Cameron Jenkins MRN: [id number] Date of Birth: 03/14/1976  Transition of [facility]) CM/SW Contact:    Justina Delcia Czar, RN Phone Number: [phone] 07/05/2024, 5:53 PM  Clinical Narrative:                 Spoke to pt and states he does not want to wear BIPAP and just wants to leave. Discussed importance of adherence to medical treatment to avoid serious complications. States he wants to sign out AMA.   Expected Discharge Plan: Home/Self Care Barriers to Discharge: Continued Medical Work up, No Barriers Identified   Patient Goals and CMS Choice            Expected Discharge Plan and Services   Discharge Planning Services: CM Consult                                          Prior Living Arrangements/Services   Lives with:: Self   Do you feel safe going back to the place where you live?: Yes               Activities of Daily Living   ADL Screening (condition at time of admission) Independently performs ADLs?: Yes (appropriate for developmental age) Is the patient deaf or have difficulty hearing?: No Does the patient have difficulty seeing, even when wearing glasses/contacts?: No Does the patient have difficulty concentrating, remembering, or making decisions?: No  Permission Sought/Granted Permission sought to share information with : Case Manager, Family Supports, PCP Permission granted to share information with : Yes, Verbal Permission Granted              Emotional Assessment   Attitude/Demeanor/Rapport: Engaged Affect (typically observed): Accepting Orientation: : Oriented to Self, Oriented to Place, Oriented to  Time, Oriented to Situation      Admission diagnosis:  Severe sepsis with acute organ dysfunction (HCC) [A41.9, R65.20] Pneumonia due to infectious organism, unspecified laterality, unspecified part of lung [J18.9] Sepsis, due to unspecified  organism, unspecified whether acute organ dysfunction present [facility]) [A41.9] Patient Active Problem List   Diagnosis Date Noted   AICD (automatic cardioverter/defibrillator) present 07/04/2024   Severe sepsis with acute organ dysfunction (HCC) 07/03/2024   Chronic systolic heart failure (HCC) 03/24/2024   Rectal bleeding 10/23/2023   Situational anxiety 10/23/2023   Skin lesion 10/23/2023   Type 2 diabetes mellitus without complication 10/09/2021   History of cardiac arrest 10/05/2021   Acute on chronic combined systolic and diastolic CHF (congestive heart failure) (HCC) 10/05/2021   Opioid use disorder, severe, dependence (HCC) 06/09/2021   Homeless 06/08/2021   Polysubstance abuse (HCC) 06/08/2021   Significant birth injury of newborn    Congestive heart failure (CHF) (HCC) 07/21/2020   Dilated cardiomyopathy (HCC) 07/21/2020   Dyspnea on exertion 07/21/2020   Obstructive sleep apnea 07/21/2020   COPD (chronic obstructive pulmonary disease) (HCC) 05/28/2020   Essential hypertension 05/28/2020   Generalized anxiety disorder 05/28/2020   LBBB (left bundle branch block) 05/28/2020   PCP:  Patient, No Pcp Per Pharmacy:   DARR[facility] LONG - [facility] [address] [place] [address] Phone: [phone] Fax: [phone]     Social Drivers of Health (SDOH) Social History: SDOH Screenings   Food Insecurity: Food Insecurity Present (07/04/2024)  Housing: High Risk (07/04/2024)  Transportation  Needs: Unmet Transportation Needs (07/04/2024)  Utilities: Not At Risk (07/04/2024)  Alcohol Screen: Low Risk  (10/16/2023)  Depression (PHQ2-9): Low Risk  (10/23/2023)  Financial Resource Strain: Medium Risk (10/16/2023)  Physical Activity: Unknown (10/16/2023)  Social Connections: Socially Isolated (10/16/2023)  Stress: Stress Concern Present (10/16/2023)  Tobacco Use: Medium Risk (07/03/2024)   SDOH Interventions:     Readmission Risk Interventions     No data  to display

## 2024-07-05 NOTE — Progress Notes (Signed)
  Echocardiogram 2D Echocardiogram has been performed.  Tinnie FORBES Gosling RDCS 07/05/2024, 9:12 AM

## 2024-07-05 NOTE — Plan of Care (Signed)
  Problem: Metabolic: Goal: Ability to maintain appropriate glucose levels will improve Outcome: Progressing   Problem: Clinical Measurements: Goal: Diagnostic test results will improve Outcome: Progressing Goal: Cardiovascular complication will be avoided Outcome: Progressing   Problem: Coping: Goal: Level of anxiety will decrease Outcome: Progressing   Problem: Pain Managment: Goal: General experience of comfort will improve and/or be controlled Outcome: Progressing

## 2024-07-05 NOTE — Progress Notes (Signed)
 Heart Failure Navigator Progress Note  Assessed for Heart & Vascular TOC clinic readiness.  Patient does not meet criteria due to he is a Advanced Heart Failure team patient of Dr. Bensimhon. .   Navigator will sign off at this time.   Stephane Haddock, BSN, Scientist, clinical (histocompatibility and immunogenetics) Only

## 2024-07-05 NOTE — Progress Notes (Signed)
 Patient declining lab draw this morning. Patient states he will allow after breakfast.  Patient educated on what labs he is delaying and risks.

## 2024-07-05 NOTE — Progress Notes (Addendum)
 Patient wanting to go home and not wanting anymore medication. Patient alert and oriented x4. Paged [facility] and [facility] about this, both providers spoke with patient and patient agreeable to stay.    Update 1800: stating he is leaving, got ready, and signed AMA paperwork. Ivs removed.    Update 1806: pt tearful and saying he does not know what to do. He will think about staying.    Update 1818: pt left AMA and he agreed with what could happened if he left.

## 2024-07-05 NOTE — Progress Notes (Signed)
 Patient unresponsive and desatting on oxygen.  Patient placed on non rebreather and oxygen levels increased.  Rapid response and respiratory therapist notified and came to the bedside.  Patient with known OSA.  Patient received IV benadryl and PO robaxin  prior.  Dr. Shona present bedside. Patient placed on BiPAP and narcan given.  Patient now awake and talking.     2230-pt unable to wear BiPAP and causing extreme anxiety.  BiPAP removed by patient.  Dr. Shona notified.  Patient placed back on 3L nasal cannula.  Patient will attempt to get someone to bring personal CPAP from home.

## 2024-07-05 NOTE — Significant Event (Signed)
 Responded to rapid response this morning.  Yesterday evening, patient complained of severe chest pain.  On exam was tender to touch.  EKG was unremarkable.  He seemed to have a a panic attack.  Given IV Dilaudid, as needed Xanax  which helped him..  Later in the evening, patient was altered and difficult to awaken.  He had to be given Narcan after which responsiveness improved.  He was up in the night and walked on the hallway. He was offered CPAP but did not want to put it on.  Earlier this morning, he took his early breakfast and went to sleep in the recliner. Later found by nurse slumped on the chair and hence the RRT Pulse present.  Vital signs taken. No fever, heart rate in 100s, regular, blood pressure in 110s, on 3 L oxygen Chart reviewed.  Has not received any sedative, narcotic after Narcan last night.  ABG in process BiPAP applied Will continue to follow during the day.  Heart failure team following as well.

## 2024-07-05 NOTE — Consult Note (Addendum)
 Advanced Heart Failure Team Consult Note   Primary Physician: Patient, No Pcp Per Cardiologist:  Dr. Cherrie   Reason for Consultation: acute on chronic systolic heart failure   HPI:    Kristofer Schaffert is seen today for evaluation of acute on chronic systolic heart failure at the request of Dr. Delford, Cardiology.   48 y/o male w/ chronic systolic  heart failure d/t NICM (cath 2024 w/ normal cors), LBBB, HTN, OSA, COPD, anxiety, prior tobacco use and previously polysubstance use including use of heroin.   His HF has been long standing, EF ~20% range for several years. Previously followed in [place]. Was first considered for CRT-D device in 2022 but his was deferred as pt was at the time homeless w/ poor social situation. Since re-locating to [place], he social situation improved, quit substance use and he ultimately underwent CRT-D by Dr. Kennyth 7/25.   He is followed in our [facility] Most recent RHC 1/25 showed biventricular failure w/ moderate PH w/ low output.  RA 12, PA 59/43(48), PCW 19, FICK CI 1.8, PVR 6.4. Plan was to try to optimize medically and continue to follow for advanced therapies.    He has now been admitted for sepsis 2/2 PNA. Initially hypotensive w/ initial LA of 5.4. PCT 0.70. Placed on NE, IVF resuscitation and abx. Pt also endorsed LBP and there was high concern for possible discitis. BCx collected. No UDS done on admission. Subsequent CT of lumbar spine showed no evidence for acute infection, CT of thoracic spine also negative. BCx NGTD.   He continues on abx for PNA. LA has cleared 5.4>>1.4>>0.9. Now off NE support. BPs variable but normotensive. SCr stable at 1.13, K 4.2.    Echo done today shows severely LVEF, EF < 20%, RV no weill visualized. IVC not well visualized.  He is feeling slightly better but still dyspneic and requiring 3L [place] (uses PNR at home). Volume overloaded. Reports full compliance w/ home GDMT and diuretics.  He is currently only on digoxin  but no other  GDMT.  Rapid response was activated this morning, altered and difficulty awaking after getting opiods. Responded to Narcan. He was not wearing CPAP at the time.    Echo (Today)  1. Left ventricular ejection fraction, by estimation, is <20%. The left  ventricle has severely decreased function. The left ventricle demonstrates  global hypokinesis. The left ventricular internal cavity size was  moderately to severely dilated. Left  ventricular diastolic parameters are indeterminate.   2. Right ventricular systolic function was not well visualized. The right  ventricular size is not well visualized.   3. The mitral valve was not well visualized. Trivial mitral valve  regurgitation.   4. The aortic valve is tricuspid. Aortic valve regurgitation is not  visualized. Aortic valve sclerosis is present, with no evidence of aortic  valve stenosis. Aortic valve mean gradient measures 3.3 mmHg. Aortic valve  Vmax measures 1.04 m/s.    Home Medications Prior to Admission medications   Medication Sig Start Date End Date Taking? Authorizing Provider  albuterol  (PROVENTIL ) (2.5 MG/3ML) 0.083% nebulizer solution Take 2.5 mg by nebulization every 6 (six) hours as needed for wheezing or shortness of breath.   Yes [provider]  budesonide -formoterol  (SYMBICORT ) 160-4.5 MCG/ACT inhaler Inhale 2 puffs into the lungs 2 (two) times daily. 04/14/24  Yes Drubel, Manuelita, PA-C  carvedilol  (COREG ) 6.25 MG tablet Take 1 tablet (6.25 mg total) by mouth 2 (two) times daily. 10/30/23 10/29/24 Yes Clegg, Amy D, NP  digoxin  (LANOXIN ) 0.125 MG tablet Take 1 tablet (0.125 mg total) by mouth daily. 04/30/24  Yes Lee, [place], NP  empagliflozin  (JARDIANCE ) 10 MG TABS tablet Take 1 tablet (10 mg total) by mouth daily before breakfast. 04/15/24  Yes Bensimhon, Toribio SAUNDERS, MD  methylPREDNISolone (MEDROL DOSEPAK) 4 MG TBPK tablet Take 4 mg by mouth See admin instructions.   Yes [provider]  metolazone  (ZAROXOLYN )  2.5 MG tablet Take 1 tablet (2.5 mg total) by mouth once a week. Take 1 tablet today and then 1 tablet 2.5 mg every Friday 40 meq every Potassium Patient taking differently: Take 2.5 mg by mouth once a week. Take 1 tablet today and then 1 tablet 2.5 mg every Wednesday 40 meq every Potassium 01/16/24  Yes Milford, Jessica M, FNP  naproxen sodium (ALEVE) 220 MG tablet Take 440 mg by mouth daily as needed.   Yes [provider]  potassium chloride  SA (KLOR-CON  M) 20 MEQ tablet Take 2 tablets (40 mEq total) by mouth 2 (two) times daily. Take an extra 2 tabs when you take metolazone  04/30/24  Yes Lee, [place], NP  sacubitril -valsartan  (ENTRESTO ) 24-26 MG Take 1 tablet by mouth 2 (two) times daily. 12/30/23  Yes Bensimhon, Toribio SAUNDERS, MD  spironolactone  (ALDACTONE ) 25 MG tablet TAKE 1 TABLET(25 MG) BY MOUTH DAILY 01/21/24  Yes Bensimhon, Toribio SAUNDERS, MD  torsemide  (DEMADEX ) 20 MG tablet Take 2 tablets (40 mg total) by mouth 2 (two) times daily. 10/30/23  Yes Clegg, Amy D, NP    Past Medical History: Past Medical History:  Diagnosis Date   Acute on chronic systolic congestive heart failure (HCC) 05/28/2020   Formatting of this note might be different from the original. IMO update 2021   Allergy    Since grade school age   Anemia    Not sure about this   Asthma 01/1992   Chest pain 05/28/2020   Congestive heart failure (CHF) (HCC) 07/21/2020   COPD (chronic obstructive pulmonary disease) (HCC) 2014   Depression 1993   Dilated cardiomyopathy (HCC) 07/21/2020   Dyspnea on exertion 07/21/2020   Essential hypertension 05/28/2020   Generalized anxiety disorder 05/28/2020   GERD (gastroesophageal reflux disease) 1991   Heart failure (HCC)    Heart murmur Birth   LBBB (left bundle branch block) 05/28/2020   Nausea and vomiting 05/28/2020   Obstructive sleep apnea 07/21/2020   Pneumonia due to COVID-19 virus 05/28/2020   Formatting of this note might be different from the original. Diagnosed by  SARS-CoV-2 PCR Cephid test at [facility] in [place], [place] on 05/28/2020.   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    Sleep apnea 2008   Substance abuse (HCC) 1996    Past Surgical History: Past Surgical History:  Procedure Laterality Date   BIV ICD INSERTION CRT-D N/A 03/24/2024   Procedure: BIV ICD INSERTION CRT-D;  Surgeon: Kennyth Chew, MD;  Location: [facility] INVASIVE CV LAB;  Service: Cardiovascular;  Laterality: N/A;   CARDIAC CATHETERIZATION  01/2023   NO PAST SURGERIES     RIGHT HEART CATH N/A 10/16/2023   Procedure: RIGHT HEART CATH;  Surgeon: Cherrie Toribio SAUNDERS, MD;  Location: [facility] INVASIVE CV LAB;  Service: Cardiovascular;  Laterality: N/A;   RIGHT/LEFT HEART CATH AND CORONARY ANGIOGRAPHY N/A 12/27/2022   Procedure: RIGHT/LEFT HEART CATH AND CORONARY ANGIOGRAPHY;  Surgeon: Cherrie Toribio SAUNDERS, MD;  Location: [facility] INVASIVE CV LAB;  Service: Cardiovascular;  Laterality: N/A;    Family History: Family  History  Problem Relation Age of Onset   COPD Mother    Heart disease Mother    Arthritis Mother    Depression Mother    Obesity Mother    Heart attack Mother    Heart attack Father    Stroke Father    Alcohol abuse Father    Drug abuse Father    Bone cancer Maternal Grandmother    Arthritis Maternal Grandmother    Diabetes Maternal Grandmother    Early death Maternal Grandmother    Obesity Maternal Grandmother    Varicose Veins Maternal Grandmother    Stomach cancer Maternal Grandfather    Cancer Maternal Grandfather    COPD Maternal Grandfather    ADD / ADHD Daughter    Alcohol abuse Brother    Depression Brother    Learning disabilities Brother    Obesity Brother    Asthma Sister    Depression Sister    Drug abuse Sister    Miscarriages / Stillbirths Sister    Obesity Sister    Cancer Maternal Uncle    Early death Maternal Uncle    Depression Maternal Aunt    Drug abuse Maternal Aunt    Miscarriages / Stillbirths Maternal Aunt      Social History: Social History   Socioeconomic History   Marital status: Divorced    Spouse name: Not on file   Number of children: Not on file   Years of education: Not on file   Highest education level: GED or equivalent  Occupational History   Not on file  Tobacco Use   Smoking status: Former    Current packs/day: 0.00    Average packs/day: 1 pack/day for 15.0 years (15.0 ttl pk-yrs)    Types: Cigarettes, E-cigarettes    Quit date: 07/21/2013    Years since quitting: 10.9   Smokeless tobacco: Never   Tobacco comments:    Quit when I was told I had copd  Vaping Use   Vaping status: Never Used  Substance and Sexual Activity   Alcohol use: Not Currently    Comment: Never drank more than a drink with dinner maybe 20 times   Drug use: Not Currently    Types: Cocaine, Fentanyl , Heroin, Hydrocodone, Ketamine, Marijuana, MDMA (Ecstacy), Methamphetamines, Morphine, Oxycodone , Other-see comments    Comment: These are from over 20 years ago.   Sexual activity: Not Currently    Birth control/protection: Abstinence, Condom, None  Other Topics Concern   Not on file  Social History Narrative   Not on file   Social Drivers of Health   Financial Resource Strain: Medium Risk (10/16/2023)   Overall Financial Resource Strain (CARDIA)    Difficulty of Paying Living Expenses: Somewhat hard  Food Insecurity: Food Insecurity Present (07/04/2024)   Hunger Vital Sign    Worried About Running Out of Food in the Last Year: Sometimes true    Ran Out of Food in the Last Year: Sometimes true  Transportation Needs: Unmet Transportation Needs (07/04/2024)   PRAPARE - Administrator, Civil Service (Medical): Yes    Lack of Transportation (Non-Medical): Yes  Physical Activity: Unknown (10/16/2023)   Exercise Vital Sign    Days of Exercise per Week: 0 days    Minutes of Exercise per Session: Not on file  Stress: Stress Concern Present (10/16/2023)   [employer] of Occupational  Health - Occupational Stress Questionnaire    Feeling of Stress : Very much  Social Connections: Socially Isolated (10/16/2023)  Social Advertising account executive    Frequency of Communication with Friends and Family: Once a week    Frequency of Social Gatherings with Friends and Family: Never    Attends Religious Services: Never    Database administrator or Organizations: No    Attends Engineer, structural: Not on file    Marital Status: Divorced    Allergies:  Allergies  Allergen Reactions   Robaxin  [place].Core ] Other (See Comments)    unresponsiveness   Acetaminophen  Itching and Rash    Objective:    Vital Signs:   Temp:  [97.7 F (36.5 C)-100.9 F (38.3 C)] 97.7 F (36.5 C) (10/20 1055) Pulse Rate:  [94-119] 103 (10/20 1055) Resp:  [12-32] 19 (10/20 1055) BP: (91-141)/(57-115) 91/57 (10/20 1055) SpO2:  [66 %-98 %] 97 % (10/20 1055) FiO2 (%):  [40 %-100 %] 40 % (10/20 0815) Weight:  [145.1 kg] 145.1 kg (10/19 1334) Last BM Date : 07/04/24  Weight change: Filed Weights   07/03/24 1342 07/04/24 1334  Weight: (!) 142.9 kg (!) 145.1 kg    Intake/Output:   Intake/Output Summary (Last 24 hours) at 07/05/2024 1156 Last data filed at 07/05/2024 0347 Gross per 24 hour  Intake 1502.83 ml  Output 600 ml  Net 902.83 ml      Physical Exam    General:  obese, mildly dyspneic  HEENT: normal Neck: JVP elevated to jaw Cor: PMI nondisplaced. Regular rate & rhythm. No rubs, gallops or murmurs. Lungs: diminished at the bases bilaterally  Abdomen: obese, soft, nontender, nondistended. No hepatosplenomegaly. No bruits or masses. Good bowel sounds. Extremities: no cyanosis, clubbing, rash, 1-2+ b/l pretibial edema Neuro: alert & orientedx3, cranial nerves grossly intact. moves all 4 extremities w/o difficulty. Affect pleasant   Telemetry   V paced 90s, personally reviewed   EKG    N/A   Labs   Basic Metabolic Panel: Recent Labs  Lab  07/03/24 1355 07/03/24 1431 07/03/24 2125 07/04/24 0839 07/05/24 0923  NA 132* 133*  --  135 134*  K 3.8 3.7  --  3.4* 4.2  CL 95* 93*  --  98 100  CO2 25  --   --  24 24  GLUCOSE 279* 218*  --  128* 114*  BUN 13 15  --  15 13  CREATININE 1.17 1.10  --  1.00 1.13  CALCIUM 8.7*  --   --  8.0* 8.2*  MG  --   --  1.7  --   --     Liver Function Tests: Recent Labs  Lab 07/03/24 1355  AST 52*  ALT 37  ALKPHOS 64  BILITOT 1.1  PROT 7.1  ALBUMIN 3.0*   No results for input(s): LIPASE, AMYLASE in the last 168 hours. No results for input(s): AMMONIA in the last 168 hours.  CBC: Recent Labs  Lab 07/03/24 1355 07/03/24 1431 07/04/24 0839 07/05/24 0923  WBC 10.3  --  9.7 6.9  NEUTROABS 9.3*  --   --  4.2  HGB 14.2 14.6 13.3 14.4  HCT 43.6 43.0 43.1 44.2  MCV 92.2  --  97.7 91.1  PLT 131*  --  115* 157    Cardiac Enzymes: Recent Labs  Lab 07/03/24 2125  CKTOTAL 96    BNP: BNP (last 3 results) Recent Labs    01/16/24 1423 04/30/24 1436 07/04/24 0839  BNP 550.2* 268.6* 482.5*    ProBNP (last 3 results) Recent Labs    09/03/23 1115 09/05/23 0955  PROBNP 1,085* 1,323*     CBG: Recent Labs  Lab 07/04/24 1949 07/04/24 2325 07/05/24 0340 07/05/24 0813 07/05/24 1052  GLUCAP 176* 119* 92 142* 189*    Coagulation Studies: Recent Labs    07/03/24 1355 07/04/24 0050  LABPROT 17.5* 18.0*  INR 1.4* 1.4*     Imaging   ECHOCARDIOGRAM COMPLETE Result Date: 07/05/2024    ECHOCARDIOGRAM REPORT   Patient Name:   ARYE WEYENBERG Date of Exam: 07/05/2024 Medical Rec #:  [id number]     Height:       73.0 in Accession #:    [id number]    Weight:       319.9 lb Date of Birth:  [birth year]     BSA:          2.627 m Patient Age:    48 years      BP:           140/83 mmHg Patient Gender: M             HR:           113 bpm. Exam Location:  Inpatient Procedure: 2D Echo, Color Doppler, Cardiac Doppler and Intracardiac            Opacification Agent (Both  Spectral and Color Flow Doppler were            utilized during procedure). Indications:    Sepsis, Dilated CMO  History:        Patient has prior history of Echocardiogram examinations, most                 recent 10/07/2022.  Sonographer:    Tinnie Gosling RDCS Sonographer#2:  Edsel Kerns Referring Phys: [id number] EKTA V PATEL IMPRESSIONS  1. Left ventricular ejection fraction, by estimation, is <20%. The left ventricle has severely decreased function. The left ventricle demonstrates global hypokinesis. The left ventricular internal cavity size was moderately to severely dilated. Left ventricular diastolic parameters are indeterminate.  2. Right ventricular systolic function was not well visualized. The right ventricular size is not well visualized.  3. The mitral valve was not well visualized. Trivial mitral valve regurgitation.  4. The aortic valve is tricuspid. Aortic valve regurgitation is not visualized. Aortic valve sclerosis is present, with no evidence of aortic valve stenosis. Aortic valve mean gradient measures 3.3 mmHg. Aortic valve Vmax measures 1.04 m/s. Comparison(s): A prior study was performed on 10/05/2021. The right ventricle had mildly reduced function and was moderately enlarged which is not well visualized today with moderate pulmonary hypertension estimated. There was mild mitral regurgitation. No other significant changes. FINDINGS  Left Ventricle: Left ventricular ejection fraction, by estimation, is <20%. The left ventricle has severely decreased function. The left ventricle demonstrates global hypokinesis. Definity  contrast agent was given IV to delineate the left ventricular endocardial borders. The left ventricular internal cavity size was moderately to severely dilated. There is borderline left ventricular hypertrophy. Left ventricular diastolic parameters are indeterminate. Right Ventricle: The right ventricular size is not well visualized. Right vetricular wall thickness was not well  visualized. Right ventricular systolic function was not well visualized. Left Atrium: Left atrial size was not well visualized. Right Atrium: Right atrial size was not well visualized. Pericardium: The pericardium was not well visualized. Mitral Valve: The mitral valve was not well visualized. Trivial mitral valve regurgitation. Tricuspid Valve: The tricuspid valve is not well visualized. Tricuspid valve regurgitation is trivial. Aortic Valve: The aortic valve is tricuspid. Aortic valve regurgitation is not visualized.  Aortic valve sclerosis is present, with no evidence of aortic valve stenosis. Aortic valve mean gradient measures 3.3 mmHg. Aortic valve peak gradient measures 4.3  mmHg. Pulmonic Valve: The pulmonic valve was not well visualized. Pulmonic valve regurgitation is not visualized. Aorta: The aortic root and ascending aorta are structurally normal, with no evidence of dilitation. Venous: The inferior vena cava was not well visualized. IAS/Shunts: The interatrial septum was not well visualized.  LEFT VENTRICLE PLAX 2D LVIDd:         7.20 cm   Diastology LVIDs:         6.40 cm   LV e' medial:    6.20 cm/s LV PW:         1.40 cm   LV E/e' medial:  13.9 LV IVS:        1.20 cm   LV e' lateral:   4.13 cm/s LVOT diam:     2.30 cm   LV E/e' lateral: 20.9 LVOT Area:     4.15 cm  LEFT ATRIUM         Index LA diam:    5.70 cm 2.17 cm/m  AORTIC VALVE                 PULMONIC VALVE AV Area (Vmax): 2.12 cm     PV Vmax:       0.55 m/s AV Vmax:        103.95 cm/s  PV Peak grad:  1.2 mmHg AV Vmean:       84.200 cm/s AV VTI:         0.149 m AV Peak Grad:   4.3 mmHg AV Mean Grad:   3.3 mmHg LVOT Vmax:      53.13 cm/s  AORTA Ao Root diam: 3.50 cm Ao Asc diam:  3.50 cm MV E velocity: 86.17 cm/s                            SHUNTS                            Systemic Diam: 2.30 cm Emeline Calender Electronically signed by Emeline Calender Signature Date/Time: 07/05/2024/10:47:08 AM    Final    CT THORACIC SPINE W CONTRAST Result  Date: 07/05/2024 EXAM: CT THORACIC SPINE WITH CONTRAST 07/04/2024 11:35:12 PM TECHNIQUE: CT of the thoracic spine was performed with the administration of 100 mL of iohexol  (OMNIPAQUE ) 350 MG/ML injection. Multiplanar reformatted images are provided for review. Automated exposure control, iterative reconstruction, and/or weight based adjustment of the mA/kV was utilized to reduce the radiation dose to as low as reasonably achievable. COMPARISON: CT performed on 07/03/2024. CLINICAL HISTORY: Thoracic radiculopathy, infection suspected, no prior imaging. FINDINGS: BONES AND ALIGNMENT: Normal vertebral body heights. No acute fracture or suspicious bone lesion. Normal alignment. No evidence for acute infection within the thoracic spine by CT. DEGENERATIVE CHANGES: No significant degenerative changes. No significant disc pathology, stenosis, or neural impingement. SOFT TISSUES: Dual lead left-sided pacemaker/AICD in place. Trace layering left pleural effusion noted. Hepatic steatosis. A few low-density renal cystic lesions noted, benign in appearance, no follow up imaging recommended. IMPRESSION: 1. No evidence of acute infection in the thoracic spine by CT. 2. No significant disc pathology, stenosis, or neural impingement. 3. Trace layering left pleural effusion. Electronically signed by: Morene Hoard MD 07/05/2024 12:06 AM EDT RP Workstation: [id number]   CT LUMBAR SPINE W CONTRAST Result  Date: 07/04/2024 EXAM: CT OF THE LUMBAR SPINE WITH CONTRAST 07/04/2024 11:35:12 PM TECHNIQUE: CT of the lumbar spine was performed with the administration of 100 mL of iohexol  (OMNIPAQUE ) 350 MG/ML injection. Multiplanar reformatted images are provided for review. Automated exposure control, iterative reconstruction, and/or weight based adjustment of the mA/kV was utilized to reduce the radiation dose to as low as reasonably achievable. COMPARISON: None available. CLINICAL HISTORY: Low back pain, infection suspected, no  prior imaging. FINDINGS: BONES AND ALIGNMENT: Normal vertebral body heights. No acute fracture or suspicious bone lesion. Straightening of the normal lumbar lordosis. No listhesis. Standard segmentation. DEGENERATIVE CHANGES: L1-L2: Unremarkable. L2-L3: Normal interspace. Mild facet spurring. No stenosis. L3-L4: Normal interspace. Mild bilateral facet spurring. No stenosis. L4-L5: Mild disc bulge or superimposed small central disc protrusion. Mild bilateral facet hypertrophy. Resulting in mild-to-moderate canal with bilateral subarticular stenosis. Foramina appear patent. L5-S1: Mild endplate spurring. Broad-based central disc protrusion closely approximates the descending S1 nerve roots without impingement or displacement. Mild facet spurring. No canal stenosis. Foramina remain patent. SOFT TISSUES: Mild aortic atherosclerosis. Hepatic steatosis. Small hyperdense left renal cyst noted, benign in appearance, no follow up imaging recommended. No acute abnormality. IMPRESSION: 1. No evidence for acute infection or other abnormality within the lumbar spine. 2. Mild degenerative disc disease at L4-5 and L5-S1 Resultant mild-to-moderate spinal stenosis at L4-L5. Electronically signed by: Morene Hoard MD 07/04/2024 11:58 PM EDT RP Workstation: [id number]     Medications:     Current Medications:  digoxin   0.125 mg Oral Daily   heparin   5,000 Units Subcutaneous Q12H   insulin aspart  0-15 Units Subcutaneous Q4H   lidocaine   1 patch Transdermal Q24H   pantoprazole (PROTONIX) IV  40 mg Intravenous Q12H    Infusions:  piperacillin-tazobactam 3.375 g (07/05/24 0708)   vancomycin 1,500 mg (07/05/24 0409)      Assessment/Plan   1. Acute on Chronic Systolic Heart Failure - long standing d/t NICM, EF ~20% range for several years. LHC 2024 normal cors - advanced therapies previously deferred given prior h/o polysubstance use, now quit. Also w/ LBBB s/p CRT-D implant 7/25, no interval improvement  since implant  - Echo this admit EF < 20%, RV not well visualized  - NYHA Class IIIb w/ marked volume overload on exam  - start diuresis w/ IV Lasix  80 mg bid and place TED hoses  - BP now normotensive and stable off pressors - restart Entresto  24-26 mg bid - restart spiro 12.5 mg daily  - restart Jardiance  10 mg daily  - continue digoxin  0.125 mg daily  - plan RHC tomorrow  - has CRT-D, V paced on tele. He was due for device check today. Have notified EP of admittance and asked to assist w/ rescheduling appt. Will plan to check device today to ensure appropriate BiV pacing   2. Sepsis PNA - sepsis resolved. LA cleared 5.4>>0.9. Now off pressors w/ stable BP. BCx NGTD - continue abx per IM   3. OSA - discussed need to sleep w/ CPAP. Unable to tolerate hospital device - he will have someone bring in home machine for use   4. Opioid Overdose - occurred overnight, responded to Narcan - limit use of pain meds - sleep w/ CPAP    Length of Stay: 2  Caffie Shed, PA-C  07/05/2024, 11:56 AM    Advanced Heart Failure Team Pager [phone] (M-F; 7a - 5p)  Please contact [facility] for night-coverage after hours (4p -7a ) and weekends on [email or web address]  Patient seen with PA, I formulated the plan and agree with the above note.    He has a history of nonischemic cardiomyopathy and prior substance abuse, severe OSA, COPD with prior smoking.  He was admitted initially to [facility] with worsening dyspnea and fever to 103, found to be hypotensive and initially required norepinephrine though able to titrate off.  CT chest showed RUL infiltrate concerning for PNA.  He has been treated for PNA with vancomycin/Zosyn.  His cardiac meds were held and BP has now stabilized.  He feels swollen, reports significant weight gain over the last few weeks.    Last night, he reported anxiety and pleuritic chest pain, received Dilaudid and Xanax , became unresponsive and required Narcan. He did not wear  CPAP last night, this morning was lethargic with acute hypercarbic respiratory failure and put on Bipap. Now off again.   Lactate initially 5.4, decreased to 0.9.  TnT mildly elevated with no significant trend.   General: NAD Neck: JVP 14-16 cm, no thyromegaly or thyroid  nodule.  Lungs: Clear to auscultation bilaterally with normal respiratory effort. CV: Nondisplaced PMI.  Heart regular S1/S2, no S3/S4, no murmur.  1+ edema to knees.  No carotid bruit.  Normal pedal pulses.  Abdomen: Soft, nontender, no hepatosplenomegaly, no distention.  Skin: Intact without lesions or rashes.  Neurologic: Alert and oriented x 3.  Psych: Normal affect. Extremities: No clubbing or cyanosis.  HEENT: Normal.   1. PNA: Patient admitted with fever, RUL infiltrate.  Suspect community-acquired PNA with sepsis initially, lactate 5.4 and hypotensive.  BP now stable and lactate has cleared. Tm 100.9 but currently afebrile.  WBCs now normal.  No further pleuritic chest pain.  - Continue Zosyn/vancomycin per primary team.  2. Acute on chronic systolic CHF: Nonischemic cardiomyopathy, LHD in 4/24 with normal coronaries.  Possibly due to substance abuse (heroin, amphetamines).  Echo this admission with EF < 20%, moderate-severe LV dilation, RV not well visualized.  This is comparable to prior echoes.  He has a [employer] CRT-D device. Initially hypotensive with sepsis syndrome, GDMT stopped.  BP now stable, creatinine 1.13. He is markedly volume overloaded on exam.  - Start Lasix  80 mg IV bid.  - Restart spironolactone  12.5 mg daily - Restart Entresto  24/26 bid. - Restart Jardiance  10 mg daily. - Continue digoxin  0.125 daily.  - Will arrange for RHC to assess filling pressures, cardiac output.  H/o low output on prior RHC in 1/25.  3. Acute hypercarbic respiratory failure: Respiratory acidosis this morning in setting of not using CPAP overnight, also got Narcan last night after sedation with Dilaudid and Xanax .  - Would  avoid opiates/benzos in future.  - Needs to use CPAP at night, can bring his home device.  4. Substance abuse: H/o opiate and methamphetamine abuse.  Did not have UDS this admission.  5. COPD: Prior smoker.   Ezra Shuck 07/05/2024 1:22 PM

## 2024-07-05 NOTE — Significant Event (Signed)
 Rapid Response Event Note   Reason for Call :  Per RN patient has been awake and ambulated in the room overnight.  This morning up to the chair and ate breakfast.  During shift report he was very difficult to awake.  Initial Focused Assessment:  Upon my arrival he is sitting in the chair.  He will wake up spontaneously but he is not responding to staff.  When he wakes up and sits up he can move all extremities but does not focus on staff or follow commands.    BP 140/83  ST 115  RR 16  O2 sat 88% on 3L He is audibly obstructing his upper airway. As he laid back his O2 sats dropped to 66%   Interventions:  Staff lifted patient to bed. Placed on Bipap ABG drawn   Plan of Care:     Event Summary:   MD Notified: Dr Arlice Call Time: 0740 Arrival Time: 0743 End Time: 0820  Elvin Portland, RN

## 2024-07-06 SURGERY — RIGHT HEART CATH
Anesthesia: LOCAL

## 2024-07-07 ENCOUNTER — Other Ambulatory Visit (HOSPITAL_COMMUNITY): Payer: Self-pay

## 2024-07-07 ENCOUNTER — Other Ambulatory Visit: Payer: Self-pay

## 2024-07-07 MED FILL — Empagliflozin Tab 10 MG: ORAL | 90 days supply | Qty: 90 | Fill #1 | Status: CN

## 2024-07-08 LAB — CULTURE, BLOOD (ROUTINE X 2)
Culture: NO GROWTH
Culture: NO GROWTH

## 2024-07-09 ENCOUNTER — Encounter: Payer: Self-pay | Admitting: Cardiology

## 2024-08-26 ENCOUNTER — Telehealth (HOSPITAL_COMMUNITY): Payer: Self-pay

## 2024-08-26 NOTE — Telephone Encounter (Signed)
 Called to confirm/remind patient of their appointment at the [facility] on 08/27/24 9:00.   Appointment:   [] Confirmed  [x] Left mess   [] No answer/No voice mail  [] VM Full/unable to leave message  [] Phone not in service  Patient reminded to bring all medications and/or complete list.  Confirmed patient has transportation. Gave directions, instructed to utilize valet parking.

## 2024-08-27 ENCOUNTER — Ambulatory Visit (HOSPITAL_COMMUNITY): Admission: RE | Admit: 2024-08-27 | Discharge: 2024-08-27 | Payer: MEDICAID | Attending: Cardiology

## 2024-08-27 ENCOUNTER — Encounter (HOSPITAL_COMMUNITY): Payer: Self-pay

## 2024-08-27 VITALS — BP 120/86 | HR 111 | Ht 73.0 in | Wt 320.6 lb

## 2024-08-27 DIAGNOSIS — M7989 Other specified soft tissue disorders: Secondary | ICD-10-CM | POA: Diagnosis not present

## 2024-08-27 DIAGNOSIS — M79662 Pain in left lower leg: Secondary | ICD-10-CM

## 2024-08-27 DIAGNOSIS — I5022 Chronic systolic (congestive) heart failure: Secondary | ICD-10-CM | POA: Diagnosis not present

## 2024-08-27 NOTE — Patient Instructions (Signed)
 PLEASE REPORT TO THE EMERGENCY ROOM FOR EVLAULATION   Follow-Up in: AS SCHEDULED IN [place] WITH DR. CHERRIE   At the [facility], you and your health needs are our priority. We have a designated team specialized in the treatment of Heart Failure. This Care Team includes your primary Heart Failure Specialized Cardiologist (physician), Advanced Practice Providers (APPs- Physician Assistants and Nurse Practitioners), and Pharmacist who all work together to provide you with the care you need, when you need it.   You may see any of the following providers on your designated Care Team at your next follow up:  Dr. Toribio Cherrie Dr. Ezra Shuck Dr. Odis Brownie Greig Mosses, NP Caffie Shed, [place] [facility] [place], [place] Beckey Coe, NP Jordan Lee, NP Tinnie Redman, PharmD   Please be sure to bring in all your medications bottles to every appointment.   Need to Contact Us :  If you have any questions or concerns before your next appointment please send us  a message through [place] or call our office at [phone].    TO LEAVE A MESSAGE FOR THE NURSE SELECT OPTION 2, PLEASE LEAVE A MESSAGE INCLUDING: YOUR NAME DATE OF BIRTH CALL BACK NUMBER REASON FOR CALL**this is important as we prioritize the call backs  YOU WILL RECEIVE A CALL BACK THE SAME DAY AS LONG AS YOU CALL BEFORE 4:00 PM

## 2024-08-27 NOTE — Progress Notes (Signed)
 [facility] NOTE  Primary Care: Patient, No Pcp Per HF Cardiologist: Dr. Cherrie  CC: Left Leg Swelling   HPI: 48 y.o. male with HFrEF sue to NICM, LBBB, HTN, OSA, COPD, anxiety, prior tobacco use, PEA arrest, and polysubstance/IVDU abuse.  Diagnosed with HF in Feb 2021 while admitted to a hospital in [place]  with CHF. No records from admission available. There was mention of possible LVAD placement and was discharged with LifeVest. He lost insurance and was not seen for follow-up.   He established care with Dr. Bernie in 11/21. Last seen in June 2022. Admitted to psych unit in September 2022 for detox from heroin. He had episode of syncope/? Possible asystole. Had very brief CPR. He was transferred to ICU at [facility]. Echo with EF 15-20%. EP considered placement of CRT-D. Defibrillator placement deferred until social situation more stable (he was homeless at the time). Did not follow-up with Cardiology after discharge.   Seen in ED at [facility] on 01/23 with chest tightness and dyspnea. Left before evaluated. Re-presented with a/c CHF. Had been out of medications 2.5 months. UDS + for amphetamines. Echo  EF < 20%, RV moderate to severely reduced, mild MR, dilated IVC. He refused cath or other HF therapies.   Cath 4/24: Normal cors. EF < 20% RA 8 PA 57/32 (42) PCW 17 Fick 5.5/2.2 PVR 4.5  Echo 1/24 EF 20-25%  RV normal   RHC 1/25:  elevated filling pressures, CO 4.5, and CI 1.8.    7/25 underwent CRT-D by Dr. Kennyth.  Admitted 10/25 with fever, RUL infiltrate. Suspected community-acquired PNA with sepsis syndrome initially, lactate 5.4 and hypotensive, responded rapidly w/ IV abx, lactate cleared. BCx showed no growth. Hospitalization was further c/b a/c CHF w/ marked volume overload and concern for possible low output. Echo showed EF < 20%, RV not well visualized. It was recommended that he stay for continued diuresis and repeat RHC but pt ultimately left AMA and did not follow up  in our clinic post hospital.   Appt made today for evaluation for lower ext swelling. He has unilateral swelling on the left LE w/ chronic nonhealing LE wound/ulcers and appearing of surrounding cellulitis, also w/ cyanosis of left patella and distal digits. He also reports decreased sensation in foot. He denies subjective f/c. EKG shows tachycardia, atrial sensed-v paced rhythm, 108 bpm.   Device interrogation shows normal impedence and fluid index well below threshold. No VT. 99 % BiV pacing.    Stable NYHA class II symptoms.    Cardiac Studies: - Echo 10/25 EF < 20%, RV not well visualized  - RHC (1/25): RA 12, PA 59/43 (48), PCW 19, Fick CO/CI 4.5/1.8, PVR  6.4, PAPi = 3,5  - CPX (5/24): FVC 5.01 (85%), FEV1 4.02 (87%), FEV1/FVC 80 (101%), BP rest: 126/70, Standing BP: 120/74, peak: 168/74, Peak VO2: 19.4 (70% predicted peak VO2) - when corrected for ibw 28.3 ml/kg/min, VE/VCO2 slope: 37  Peak RER: 1.09  - R/LHC (1/24): RA 8, PA  57/32 (42), PCW  17, Fick CO/CI  5.5/2.2, PVR  4.5; normal cors - Echo (1/24): EF 20-25%, RV normal - Echo (1/23): EF < 20%, RV moderate to severely reduced, mild MR, dilated IVC. - Echo (11/21) [facility] EF 15-20%  Past Medical History:  Diagnosis Date   Acute on chronic systolic congestive heart failure (HCC) 05/28/2020   Formatting of this note might be different from the original. IMO update 2021   Allergy    Since grade school  age   Anemia    Not sure about this   Asthma 01/1992   Chest pain 05/28/2020   Congestive heart failure (CHF) (HCC) 07/21/2020   COPD (chronic obstructive pulmonary disease) (HCC) 2014   Depression 1993   Dilated cardiomyopathy (HCC) 07/21/2020   Dyspnea on exertion 07/21/2020   Essential hypertension 05/28/2020   Generalized anxiety disorder 05/28/2020   GERD (gastroesophageal reflux disease) 1991   Heart failure (HCC)    Heart murmur Birth   LBBB (left bundle branch block) 05/28/2020   Nausea and vomiting 05/28/2020    Obstructive sleep apnea 07/21/2020   Pneumonia due to COVID-19 virus 05/28/2020   Formatting of this note might be different from the original. Diagnosed by SARS-CoV-2 PCR Cephid test at [facility] in [place], [place] on 05/28/2020.   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     budesonide -formoterol  (SYMBICORT ) 160-4.5 MCG/ACT inhaler Inhale 2 puffs into the lungs 2 (two) times daily. 10.2 g 3   carvedilol  (COREG ) 6.25 MG tablet Take 1 tablet (6.25 mg total) by mouth 2 (two) times daily. 60 tablet 11   digoxin  (LANOXIN ) 0.125 MG tablet Take 1 tablet (0.125 mg total) by mouth daily. 30 tablet 11   empagliflozin  (JARDIANCE ) 10 MG TABS tablet Take 1 tablet (10 mg total) by mouth daily before breakfast. 90 tablet 3   methylPREDNISolone (MEDROL DOSEPAK) 4 MG TBPK tablet Take 4 mg by mouth See admin instructions.     metolazone  (ZAROXOLYN ) 2.5 MG tablet Take 1 tablet (2.5 mg total) by mouth once a week. Take 1 tablet today and then 1 tablet 2.5 mg every Friday 40 meq every Potassium (Patient taking differently: Take 2.5 mg by mouth once a week. Take 1 tablet today and then 1 tablet 2.5 mg every Wednesday 40 meq every Potassium) 5 tablet 5   naproxen sodium (ALEVE) 220 MG tablet Take 440 mg by mouth daily as needed.     potassium chloride  SA (KLOR-CON  M) 20 MEQ tablet Take 2 tablets (40 mEq total) by mouth 2 (two) times daily. Take an extra 2 tabs when you take metolazone  60 tablet 11   sacubitril -valsartan  (ENTRESTO ) 24-26 MG Take 1 tablet by mouth 2 (two) times daily. 180 tablet 3   spironolactone  (ALDACTONE ) 25 MG tablet TAKE 1 TABLET(25 MG) BY MOUTH DAILY 90 tablet 2   torsemide  (DEMADEX ) 20 MG tablet Take 2  tablets (40 mg total) by mouth 2 (two) times daily. 120 tablet 11   No current facility-administered medications for this encounter.   Allergies  Allergen Reactions   Robaxin  [Methocarbamol ] Other (See Comments)    unresponsiveness   Acetaminophen  Itching and Rash   Social History   Socioeconomic History   Marital status: Divorced    Spouse name: Not on file   Number of children: Not on file   Years of education: Not on file   Highest education level: GED or equivalent  Occupational History   Not on file  Tobacco Use   Smoking status: Former    Current packs/day: 0.00    Average packs/day: 1 pack/day for 15.0 years (15.0 ttl pk-yrs)    Types: Cigarettes, E-cigarettes    Quit date:  07/21/2013    Years since quitting: 11.1   Smokeless tobacco: Never   Tobacco comments:    Quit when I was told I had copd  Vaping Use   Vaping status: Never Used  Substance and Sexual Activity   Alcohol use: Not Currently    Comment: Never drank more than a drink with dinner maybe 20 times   Drug use: Not Currently    Types: Cocaine, Fentanyl , Heroin, Hydrocodone, Ketamine, Marijuana, MDMA (Ecstacy), Methamphetamines, Morphine , Oxycodone , Other-see comments    Comment: These are from over 20 years ago.   Sexual activity: Not Currently    Birth control/protection: Abstinence, Condom, None  Other Topics Concern   Not on file  Social History Narrative   Not on file   Social Drivers of Health   Tobacco Use: Medium Risk (08/27/2024)   Patient History    Smoking Tobacco Use: Former    Smokeless Tobacco Use: Never    Passive Exposure: Not on file  Financial Resource Strain: Medium Risk (10/16/2023)   Overall Financial Resource Strain (CARDIA)    Difficulty of Paying Living Expenses: Somewhat hard  Food Insecurity: Food Insecurity Present (07/04/2024)   Epic    Worried About Programme Researcher, Broadcasting/film/video in the Last Year: Sometimes true    Ran Out of Food in the Last Year: Sometimes true   Transportation Needs: Unmet Transportation Needs (07/04/2024)   Epic    Lack of Transportation (Medical): Yes    Lack of Transportation (Non-Medical): Yes  Physical Activity: Unknown (10/16/2023)   Exercise Vital Sign    Days of Exercise per Week: 0 days    Minutes of Exercise per Session: Not on file  Stress: Stress Concern Present (10/16/2023)   [employer] of Occupational Health - Occupational Stress Questionnaire    Feeling of Stress : Very much  Social Connections: Socially Isolated (10/16/2023)   Social Connection and Isolation Panel    Frequency of Communication with Friends and Family: Once a week    Frequency of Social Gatherings with Friends and Family: Never    Attends Religious Services: Never    Database Administrator or Organizations: No    Attends Engineer, Structural: Not on file    Marital Status: Divorced  Intimate Partner Violence: Not At Risk (07/04/2024)   Epic    Fear of Current or Ex-Partner: No    Emotionally Abused: No    Physically Abused: No    Sexually Abused: No  Depression (PHQ2-9): Low Risk (10/23/2023)   Depression (PHQ2-9)    PHQ-2 Score: 2  Alcohol Screen: Low Risk (10/16/2023)   Alcohol Screen    Last Alcohol Screening Score (AUDIT): 0  Housing: High Risk (07/04/2024)   Epic    Unable to Pay for Housing in the Last Year: Yes    Number of Times Moved in the Last Year: 0    Homeless in the Last Year: No  Utilities: Not At Risk (07/04/2024)   Epic    Threatened with loss of utilities: No  Health Literacy: Not on file   Family History  Problem Relation Age of Onset   COPD Mother    Heart disease Mother    Arthritis Mother    Depression Mother    Obesity Mother    Heart attack Mother    Heart attack Father    Stroke Father    Alcohol abuse Father    Drug abuse Father    Bone cancer Maternal Grandmother    Arthritis  Maternal Grandmother    Diabetes Maternal Grandmother    Early death Maternal Grandmother    Obesity  Maternal Grandmother    Varicose Veins Maternal Grandmother    Stomach cancer Maternal Grandfather    Cancer Maternal Grandfather    COPD Maternal Grandfather    ADD / ADHD Daughter    Alcohol abuse Brother    Depression Brother    Learning disabilities Brother    Obesity Brother    Asthma Sister    Depression Sister    Drug abuse Sister    Miscarriages / Stillbirths Sister    Obesity Sister    Cancer Maternal Uncle    Early death Maternal Uncle    Depression Maternal Aunt    Drug abuse Maternal Aunt    Miscarriages / Stillbirths Maternal Aunt    BP 120/86   Pulse (!) 111   Ht 6' 1 (1.854 m)   Wt (!) 145.4 kg (320 lb 9.6 oz)   SpO2 95%   BMI 42.30 kg/m   Wt Readings from Last 3 Encounters:  08/27/24 (!) 145.4 kg (320 lb 9.6 oz)  07/04/24 (!) 145.1 kg (319 lb 14.4 oz)  04/30/24 (!) 137 kg (302 lb)    Physical Exam GENERAL: obese, disheveled appearing, NAD Lungs- clear  CARDIAC:  thick neck, JVD not well visualized. Tachy rate with regular rhythm. No MRG. Unilateral leg swelling (see below) ABDOMEN: Obese, soft, non-tender, non-distended.  EXTREMITIES: 2+ left lower ext edema w/ chronic nonhealing wounds w/ appearance of underling cellulitis + cyanosis concerning for significant PAD NEUROLOGIC: No obvious FND    ASSESSMENT & PLAN:  1. Unilateral Leg Swelling: high concern for significant PAD/Critical Limb Ischemia vs venous insuffiencey. I have recommended pt go to the ED for emergent evaluation, starting w/ arterial and venous dopplers, Wound/BCx and empiric initiation of abx. Informed pt of the risk of loss of limb and risk for systemic infection, which would be very concerning w/ the presence of an ICD/ risk for endocarditis.    2. Chronic Systolic Heart Failure  - Diagnosed February 2021 while admitted for HF in [place] . EF 10% at that time. EF has been severely reduced since that time.  - ?Etiology previous substance abuse and LBBB.   - Cath 4/24: Normal  cors. EF < 20% RA 8 PA 57/32 (42) PCW 17 Fick 5.5/2.2 PVR 4.5 - CPX 5/24 pVO2: 19.4 (70% predicted peak VO2) - corrected for ibw 28.3 ml/kg/min. VE/VCO2  37  pRER: 1.09  - RHC 1/25 - RA 12, PCWP 19, CO 4.5, CI 1.8.  - s/p CRT-D 7/25  - Echo 10/25 EF < 20%, RV not well visualized - NYHA II, confounded by obesity and deconditioning. Device interrogation shows normal impedence w/ fluid index well below threshold. His edema is not 2/2 HF.  - Continue Torsemide  40 mg bid + once weekly metolazone , 2.5 mg - Continue KCL 40 mEq bid + additional 40 mEq with weekly metolazone  - Continue digoxin  0.125 mg daily.  - Continue carvedilol  6.25 mg bid.  - Continue spironolactone  25 mg daily. - Continue entresto  24/26 mg bid. - Continue jardiance  10 mg daily. - Pt going to ED, labs (BMP) will be obtained   3. LBBB - QRS 176 msec on ECG 10/30/23 - now s/p MDT CRT-D 7/25  - device interrogation shows 99% BiV pacing, QRS improved on today's EKG 128 ms - no VT on device interrogation   4. HTN - controlled on current regimen   - Meds  as above   5. H/O Polysubstance abuse - H/o heroin and meth abuse.  - UDS + for amphetamines during 2023 admission. He was not druged screen his most recent admit  6. Severe OSA  Moderate CSA  Nocturnal Hypoxia - by sleep study 03/21/24 - needs to get BiPAP mask. Needs f/u w/ Dr. Shlomo   F/u w/ Dr. Cherrie in 4 wks   Caffie Shed, PA-C  08/27/2024

## 2024-09-03 ENCOUNTER — Telehealth (HOSPITAL_COMMUNITY): Payer: Self-pay

## 2024-09-03 NOTE — Telephone Encounter (Signed)
 No pre cert was required for in person sleep study

## 2024-09-17 ENCOUNTER — Encounter: Payer: Self-pay | Admitting: Internal Medicine

## 2024-09-21 ENCOUNTER — Other Ambulatory Visit (HOSPITAL_COMMUNITY): Payer: Self-pay

## 2024-09-21 ENCOUNTER — Encounter (HOSPITAL_COMMUNITY): Payer: Self-pay | Admitting: Internal Medicine

## 2024-09-21 ENCOUNTER — Ambulatory Visit (HOSPITAL_COMMUNITY)
Admission: RE | Admit: 2024-09-21 | Discharge: 2024-09-21 | Disposition: A | Discharge status: Home / Self Care | Payer: MEDICAID | Source: Ambulatory Visit | Attending: Internal Medicine | Admitting: Internal Medicine

## 2024-09-21 ENCOUNTER — Other Ambulatory Visit: Payer: Self-pay

## 2024-09-21 VITALS — BP 104/60 | HR 111 | Wt 324.6 lb

## 2024-09-21 DIAGNOSIS — Z79899 Other long term (current) drug therapy: Secondary | ICD-10-CM | POA: Insufficient documentation

## 2024-09-21 DIAGNOSIS — L97919 Non-pressure chronic ulcer of unspecified part of right lower leg with unspecified severity: Secondary | ICD-10-CM | POA: Diagnosis not present

## 2024-09-21 DIAGNOSIS — I428 Other cardiomyopathies: Secondary | ICD-10-CM | POA: Diagnosis present

## 2024-09-21 DIAGNOSIS — F1911 Other psychoactive substance abuse, in remission: Secondary | ICD-10-CM

## 2024-09-21 DIAGNOSIS — Z7984 Long term (current) use of oral hypoglycemic drugs: Secondary | ICD-10-CM | POA: Insufficient documentation

## 2024-09-21 DIAGNOSIS — J449 Chronic obstructive pulmonary disease, unspecified: Secondary | ICD-10-CM | POA: Diagnosis not present

## 2024-09-21 DIAGNOSIS — Z87891 Personal history of nicotine dependence: Secondary | ICD-10-CM | POA: Insufficient documentation

## 2024-09-21 DIAGNOSIS — Z8674 Personal history of sudden cardiac arrest: Secondary | ICD-10-CM | POA: Insufficient documentation

## 2024-09-21 DIAGNOSIS — S81802A Unspecified open wound, left lower leg, initial encounter: Secondary | ICD-10-CM | POA: Diagnosis not present

## 2024-09-21 DIAGNOSIS — I110 Hypertensive heart disease with heart failure: Secondary | ICD-10-CM | POA: Diagnosis present

## 2024-09-21 DIAGNOSIS — F1991 Other psychoactive substance use, unspecified, in remission: Secondary | ICD-10-CM | POA: Diagnosis not present

## 2024-09-21 DIAGNOSIS — I5022 Chronic systolic (congestive) heart failure: Secondary | ICD-10-CM | POA: Diagnosis present

## 2024-09-21 DIAGNOSIS — G4736 Sleep related hypoventilation in conditions classified elsewhere: Secondary | ICD-10-CM | POA: Insufficient documentation

## 2024-09-21 DIAGNOSIS — L989 Disorder of the skin and subcutaneous tissue, unspecified: Secondary | ICD-10-CM | POA: Diagnosis not present

## 2024-09-21 DIAGNOSIS — Z95810 Presence of automatic (implantable) cardiac defibrillator: Secondary | ICD-10-CM

## 2024-09-21 DIAGNOSIS — I447 Left bundle-branch block, unspecified: Secondary | ICD-10-CM | POA: Diagnosis not present

## 2024-09-21 DIAGNOSIS — F419 Anxiety disorder, unspecified: Secondary | ICD-10-CM | POA: Diagnosis not present

## 2024-09-21 DIAGNOSIS — G4733 Obstructive sleep apnea (adult) (pediatric): Secondary | ICD-10-CM | POA: Diagnosis not present

## 2024-09-21 DIAGNOSIS — N529 Male erectile dysfunction, unspecified: Secondary | ICD-10-CM | POA: Diagnosis not present

## 2024-09-21 LAB — BASIC METABOLIC PANEL WITH GFR
Anion gap: 12 (ref 5–15)
BUN: 14 mg/dL (ref 6–20)
CO2: 26 mmol/L (ref 22–32)
Calcium: 9.2 mg/dL (ref 8.9–10.3)
Chloride: 97 mmol/L — ABNORMAL LOW (ref 98–111)
Creatinine, Ser: 1.09 mg/dL (ref 0.61–1.24)
GFR, Estimated: >60 mL/min
Glucose, Bld: 134 mg/dL — ABNORMAL HIGH (ref 70–99)
Potassium: 5 mmol/L (ref 3.5–5.1)
Sodium: 135 mmol/L (ref 135–145)

## 2024-09-21 LAB — PRO BRAIN NATRIURETIC PEPTIDE: Pro Brain Natriuretic Peptide: 731 pg/mL — ABNORMAL HIGH

## 2024-09-21 MED ORDER — SILDENAFIL CITRATE 50 MG PO TABS
50.0000 mg | ORAL_TABLET | Freq: Every day | ORAL | 0 refills | Status: AC | PRN
  Filled 2024-09-21: qty 10, 10d supply, fill #0

## 2024-09-21 NOTE — Patient Instructions (Addendum)
 Medication Changes:  START Viagra  50 mg AS NEEDED, DO NOT TAKE IF SBP <100  Lab Work:  Labs done today, your results will be available in MyChart, we will contact you for abnormal readings.   Referrals:  [facility] have been [facility] to [facility], they will call you   Special Instructions // Education:  Do the following things EVERYDAY: Weigh yourself in the morning before breakfast. Write it down and keep it in a log. Take your medicines as prescribed Eat low salt foods--Limit salt (sodium) to 2000 mg per day.  Stay as active as you can everyday Limit all fluids for the day to less than 2 liters   Follow-Up in: 2 months   At the [facility], you and your health needs are our priority. We have a designated team specialized in the treatment of Heart Failure. This Care Team includes your primary Heart Failure Specialized Cardiologist (physician), Advanced Practice Providers (APPs- Physician Assistants and Nurse Practitioners), and Pharmacist who all work together to provide you with the care you need, when you need it.   You may see any of the following providers on your designated Care Team at your next follow up:  Dr. Toribio Fuel Dr. Ezra Shuck Dr. Odis Brownie Greig Mosses, NP Caffie Shed, [place] [facility] [place], [place] Beckey Coe, NP Jordan Lee, NP Tinnie Redman, PharmD   Please be sure to bring in all your medications bottles to every appointment.   Need to Contact Us :  If you have any questions or concerns before your next appointment please send us  a message through [place] or call our office at [phone].    TO LEAVE A MESSAGE FOR THE NURSE SELECT OPTION 2, PLEASE LEAVE A MESSAGE INCLUDING: YOUR NAME DATE OF BIRTH CALL BACK NUMBER REASON FOR CALL**this is important as we prioritize the call backs  YOU WILL RECEIVE A CALL BACK THE SAME DAY AS LONG AS YOU CALL BEFORE 4:00 PM

## 2024-09-21 NOTE — Progress Notes (Signed)
 "  ADVANCED HF CLINIC NOTE  Primary Care: Patient, No Pcp Per HF Cardiologist: Dr. Cherrie  CC: Left Leg Swelling   HPI: 49 y.o. male with HFrEF sue to NICM, LBBB, HTN, OSA, COPD, anxiety, prior tobacco use, PEA arrest, and polysubstance/IVDU abuse.  Diagnosed with HF in Feb 2021 while admitted to a hospital in [place]  with CHF. No records from admission available. There was mention of possible LVAD placement and was discharged with LifeVest. He lost insurance and was not seen for follow-up.   He established care with Dr. Bernie in 11/21. Last seen in June 2022. Admitted to psych unit in September 2022 for detox from heroin. He had episode of syncope/? Possible asystole. Had very brief CPR. He was transferred to ICU at [facility]. Echo with EF 15-20%. EP considered placement of CRT-D. Defibrillator placement deferred until social situation more stable (he was homeless at the time). Did not follow-up with Cardiology after discharge.   Seen in ED at [facility] on 01/23 with chest tightness and dyspnea. Left before evaluated. Re-presented with a/c CHF. Had been out of medications 2.5 months. UDS + for amphetamines. Echo  EF < 20%, RV moderate to severely reduced, mild MR, dilated IVC. He refused cath or other HF therapies.   Cath 4/24: Normal cors. EF < 20% RA 8 PA 57/32 (42) PCW 17 Fick 5.5/2.2 PVR 4.5  Echo 1/24 EF 20-25%  RV normal   RHC 1/25:  elevated filling pressures, CO 4.5, and CI 1.8.    7/25 underwent CRT-D by Dr. Kennyth.  Admitted 10/25 with fever, RUL infiltrate. Suspected community-acquired PNA with sepsis syndrome initially, lactate 5.4 and hypotensive, responded rapidly w/ IV abx, lactate cleared. BCx showed no growth. Hospitalization was further c/b a/c CHF w/ marked volume overload and concern for possible low output. Echo showed EF < 20%, RV not well visualized. It was recommended that he stay for continued diuresis and repeat RHC but pt ultimately left AMA and did not follow up  in our clinic post hospital.   Was seen in 12/25 in Clinic due to unilateral leg swelling and non-healing wound high concern for significant PAD/Critical Limb Ischemia vs venous insuffiencey. Refused. Admitted to [facility]23-12/30/25 for cellulitis.; Doppler was negative for DVT. ABIs normal. Imaging included a chest X-ray without acute findings. 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 Treated with vancomycin .  Here for f/u. Feels better but left LE still swells. Wounds almost completely healed. Denies CP. Mild DOE, Compliant with meds. Frustrated about care he received in the ED.   Cardiac Studies: - Echo 10/25 EF < 20%, RV not well visualized  - RHC (1/25): RA 12, PA 59/43 (48), PCW 19, Fick CO/CI 4.5/1.8, PVR  6.4, PAPi = 3,5  - CPX (5/24): FVC 5.01 (85%), FEV1 4.02 (87%), FEV1/FVC 80 (101%), BP rest: 126/70, Standing BP: 120/74, peak: 168/74, Peak VO2: 19.4 (70% predicted peak VO2) - when corrected for ibw 28.3 ml/kg/min, VE/VCO2 slope: 37  Peak RER: 1.09  - R/LHC (1/24): RA 8, PA  57/32 (42), PCW  17, Fick CO/CI  5.5/2.2, PVR  4.5; normal cors - Echo (1/24): EF 20-25%, RV normal - Echo (1/23): EF < 20%, RV moderate to severely reduced, mild MR, dilated IVC. - Echo (11/21) [facility] EF 15-20%  Past Medical History:  Diagnosis Date   Acute on chronic systolic congestive heart failure (HCC) 05/28/2020   Formatting of this note might be different from the original.  IMO update 2021   Allergy    Since grade school age   Anemia    Not sure about this   Asthma 01/1992   Chest pain 05/28/2020   Congestive heart failure (CHF) (HCC) 07/21/2020   COPD (chronic obstructive pulmonary disease) (HCC) 2014   Depression 1993   Dilated cardiomyopathy (HCC) 07/21/2020   Dyspnea on exertion 07/21/2020   Essential hypertension 05/28/2020   Generalized anxiety disorder 05/28/2020   GERD (gastroesophageal reflux disease)  1991   Heart failure (HCC)    Heart murmur Birth   LBBB (left bundle branch block) 05/28/2020   Nausea and vomiting 05/28/2020   Obstructive sleep apnea 07/21/2020   Pneumonia due to COVID-19 virus 05/28/2020   Formatting of this note might be different from the original. Diagnosed by SARS-CoV-2 PCR Cephid test at [facility] in [place], [place] on 05/28/2020.   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     budesonide -formoterol  (SYMBICORT ) 160-4.5 MCG/ACT inhaler Inhale 2 puffs into the lungs 2 (two) times daily. 10.2 g 3   carvedilol  (COREG ) 6.25 MG tablet Take 1 tablet (6.25 mg total) by mouth 2 (two) times daily. 60 tablet 11   digoxin  (LANOXIN ) 0.125 MG tablet Take 1 tablet (0.125 mg total) by mouth daily. 30 tablet 11   empagliflozin  (JARDIANCE ) 10 MG TABS tablet Take 1 tablet (10 mg total) by mouth daily before breakfast. 90 tablet 3   metolazone  (ZAROXOLYN ) 2.5 MG tablet Take 1 tablet (2.5 mg total) by mouth once a week. Take 1 tablet today and then 1 tablet 2.5 mg every Friday 40 meq every Potassium 5 tablet 5   potassium chloride  SA (KLOR-CON  M) 20 MEQ tablet Take 2 tablets (40 mEq total) by mouth 2 (two) times daily. Take an extra 2 tabs when you take metolazone  60 tablet 11   sacubitril -valsartan  (ENTRESTO ) 24-26 MG Take 1 tablet by mouth 2 (two) times daily. 180 tablet 3   spironolactone  (ALDACTONE ) 25 MG tablet TAKE 1 TABLET(25 MG) BY MOUTH DAILY 90 tablet 2   torsemide  (DEMADEX ) 20 MG tablet Take 2 tablets (40 mg total) by mouth 2 (two) times daily. 120 tablet 11   No current facility-administered medications for this encounter.   Allergies  Allergen Reactions   Acetaminophen  Itching  and Rash   Social History   Socioeconomic History   Marital status: Divorced    Spouse name: Not on file   Number of children: Not on file   Years of education: Not on file   Highest education level: GED or equivalent  Occupational History   Not on file  Tobacco Use   Smoking status: Former    Current packs/day: 0.00    Average packs/day: 1 pack/day for 15.0 years (15.0 ttl pk-yrs)    Types: Cigarettes, E-cigarettes    Quit date: 07/21/2013    Years since quitting: 11.1   Smokeless tobacco: Never   Tobacco comments:    Quit when I was told I had copd  Vaping Use   Vaping status: Never Used  Substance and Sexual Activity   Alcohol use: Not Currently    Comment: Never drank more than a drink with dinner maybe 20 times  Drug use: Not Currently    Types: Cocaine, Fentanyl , Heroin, Hydrocodone, Ketamine, Marijuana, MDMA (Ecstacy), Methamphetamines, Morphine , Oxycodone , Other-see comments    Comment: These are from over 20 years ago.   Sexual activity: Not Currently    Birth control/protection: Abstinence, Condom, None  Other Topics Concern   Not on file  Social History Narrative   Not on file   Social Drivers of Health   Tobacco Use: Unknown (09/07/2024)   Received from [facility]   Patient History    Smoking Tobacco Use: Unknown    Smokeless Tobacco Use: Never    Passive Exposure: Not on file  Recent Concern: Tobacco Use - Medium Risk (08/27/2024)   Patient History    Smoking Tobacco Use: Former    Smokeless Tobacco Use: Never    Passive Exposure: Not on file  Financial Resource Strain: Medium Risk (10/16/2023)   Overall Financial Resource Strain (CARDIA)    Difficulty of Paying Living Expenses: Somewhat hard  Food Insecurity: High Risk (09/08/2024)   Received from [facility]   Epic    Within the past 12 months, you worried that your food would run out before you got money to buy more: Often true    Within the past 12 months, the food you bought just didn't  last and you didn't have money to get more. : Often true  Transportation Needs: Unmet Transportation Needs (09/08/2024)   Received from [facility]    In the past 12 months, has lack of reliable transportation kept you from medical appointments, meetings, work or from getting things needed for daily living? : Yes  Physical Activity: Unknown (10/16/2023)   Exercise Vital Sign    Days of Exercise per Week: 0 days    Minutes of Exercise per Session: Not on file  Stress: Stress Concern Present (10/16/2023)   [employer] of Occupational Health - Occupational Stress Questionnaire    Feeling of Stress : Very much  Social Connections: Socially Isolated (10/16/2023)   Social Connection and Isolation Panel    Frequency of Communication with Friends and Family: Once a week    Frequency of Social Gatherings with Friends and Family: Never    Attends Religious Services: Never    Database Administrator or Organizations: No    Attends Engineer, Structural: Not on file    Marital Status: Divorced  Intimate Partner Violence: Not At Risk (07/04/2024)   Epic    Fear of Current or Ex-Partner: No    Emotionally Abused: No    Physically Abused: No    Sexually Abused: No  Depression (PHQ2-9): Low Risk (10/23/2023)   Depression (PHQ2-9)    PHQ-2 Score: 2  Alcohol Screen: Low Risk (10/16/2023)   Alcohol Screen    Last Alcohol Screening Score (AUDIT): 0  Housing: High Risk (09/08/2024)   Received from [facility]   Epic    What is your living situation today?: I do not have a steady place to live (temporarily staying with others, in a hotel, in a shelter, ...    Think about the place you live. Do you have problems with any of the following? Choose all that apply:: Lack of heat  Utilities: Medium Risk (09/08/2024)   Received from [facility]   Utilities    In the past 12 months has the electric, gas, oil, or water company threatened to shut off services in your home? : Yes   Health Literacy: Not on file   Family  History  Problem Relation Age of Onset   COPD Mother    Heart disease Mother    Arthritis Mother    Depression Mother    Obesity Mother    Heart attack Mother    Heart attack Father    Stroke Father    Alcohol abuse Father    Drug abuse Father    Bone cancer Maternal Grandmother    Arthritis Maternal Grandmother    Diabetes Maternal Grandmother    Early death Maternal Grandmother    Obesity Maternal Grandmother    Varicose Veins Maternal Grandmother    Stomach cancer Maternal Grandfather    Cancer Maternal Grandfather    COPD Maternal Grandfather    ADD / ADHD Daughter    Alcohol abuse Brother    Depression Brother    Learning disabilities Brother    Obesity Brother    Asthma Sister    Depression Sister    Drug abuse Sister    Miscarriages / Stillbirths Sister    Obesity Sister    Cancer Maternal Uncle    Early death Maternal Uncle    Depression Maternal Aunt    Drug abuse Maternal Aunt    Miscarriages / Stillbirths Maternal Aunt    BP 104/60   Pulse (!) 111   Wt (!) 147.2 kg (324 lb 9.6 oz)   SpO2 92%   BMI 42.83 kg/m   Wt Readings from Last 3 Encounters:  09/21/24 (!) 147.2 kg (324 lb 9.6 oz)  08/27/24 (!) 145.4 kg (320 lb 9.6 oz)  07/04/24 (!) 145.1 kg (319 lb 14.4 oz)    Physical Exam General:  Sitting up in chair No resp difficulty HEENT: normal Neck: supple. no JVD.  Cor: Regular rate & rhythm. No rubs, gallops or murmurs. Lungs: clear Abdomen: obese soft, nontender, nondistended.Good bowel sounds. Extremities: no cyanosis, clubbing, rash, 2+ edema LLE with nearly healed wounds  RLE ok Neuro: alert & orientedx3, cranial nerves grossly intact. moves all 4 extremities w/o difficulty. Affect pleasant  ICD interrogation: No VT/AF. Patient activity 3.2 h/day. Volume ok  99% biv pacing.Personally reviewed   ASSESSMENT & PLAN:   1. Chronic Systolic Heart Failure  - Diagnosed February 2021 while admitted for HF  in [place] . EF 10% at that time. EF has been severely reduced since that time.  - ?Etiology previous substance abuse and LBBB.   - Cath 4/24: Normal cors. EF < 20% RA 8 PA 57/32 (42) PCW 17 Fick 5.5/2.2 PVR 4.5 - CPX 5/24 pVO2: 19.4 (70% predicted peak VO2) - corrected for ibw 28.3 ml/kg/min. VE/VCO2  37  pRER: 1.09  - RHC 1/25 - RA 12, PCWP 19, CO 4.5, CI 1.8.  - s/p CRT-D 7/25  - Echo 10/25 EF < 20%, RV not well visualized - Stable NYHA II-III Volume ok  - Continue Torsemide  40 mg bid + once weekly metolazone , 2.5 mg - Continue KCL 40 mEq bid + additional 40 mEq with weekly metolazone  - Continue digoxin  0.125 mg daily.  - Continue carvedilol  6.25 mg bid.  - Continue spironolactone  25 mg daily. - Continue entresto  24/26 mg bid. - Continue jardiance  10 mg daily.  2. Unilateral Leg Swelling with RLE wound:  - 12/25: Doppler was negative for DVT. ABIs normal.  - improved but not completely healed - Refer to Wound Care Clinic  3. LBBB - QRS 176 msec on ECG 10/30/23 - now s/p MDT CRT-D 7/25  - ICD interrogated personally  4. HTN - Blood pressure well  controlled. Continue current regimen.   5. H/O Polysubstance abuse - H/o heroin and meth abuse.  - UDS + for amphetamines during 2023 admission.  - resolved  6. Severe OSA  Moderate CSA  Nocturnal Hypoxia - by sleep study 03/21/24 - says he can't get it to quit leaking  7. Erectile dysfunction - wants to try viagra . We discussed risks of low BP and worsening HF - will try to sildenafil  50. Monitor BP closely    Toribio Fuel, MD  09/21/2024  "

## 2024-09-22 ENCOUNTER — Other Ambulatory Visit: Payer: Self-pay

## 2024-09-23 ENCOUNTER — Ambulatory Visit: Payer: MEDICAID | Admitting: Student

## 2024-09-27 ENCOUNTER — Ambulatory Visit (INDEPENDENT_AMBULATORY_CARE_PROVIDER_SITE_OTHER): Payer: MEDICAID

## 2024-09-27 DIAGNOSIS — I447 Left bundle-branch block, unspecified: Secondary | ICD-10-CM

## 2024-09-28 ENCOUNTER — Ambulatory Visit (HOSPITAL_COMMUNITY): Payer: Self-pay | Admitting: *Deleted

## 2024-09-28 ENCOUNTER — Encounter (HOSPITAL_COMMUNITY): Payer: Self-pay

## 2024-09-28 LAB — CUP PACEART REMOTE DEVICE CHECK
Battery Remaining Longevity: 120 mo
Battery Voltage: 3.04 V
Brady Statistic AP VP Percent: 0.01 %
Brady Statistic AP VS Percent: 0 %
Brady Statistic AS VP Percent: 99.53 %
Brady Statistic AS VS Percent: 0.46 %
Brady Statistic RA Percent Paced: 0.01 %
Brady Statistic RV Percent Paced: 0 %
Date Time Interrogation Session: 20260112033917
HighPow Impedance: 94 Ohm
Implantable Lead Connection Status: 753985
Implantable Lead Connection Status: 753985
Implantable Lead Connection Status: 753985
Implantable Lead Implant Date: 20250709
Implantable Lead Implant Date: 20250709
Implantable Lead Implant Date: 20250709
Implantable Lead Location: 753858
Implantable Lead Location: 753859
Implantable Lead Location: 753860
Implantable Lead Model: 3830
Implantable Lead Model: 5076
Implantable Pulse Generator Implant Date: 20250709
Lead Channel Impedance Value: 247 Ohm
Lead Channel Impedance Value: 323 Ohm
Lead Channel Impedance Value: 437 Ohm
Lead Channel Impedance Value: 456 Ohm
Lead Channel Impedance Value: 494 Ohm
Lead Channel Impedance Value: 513 Ohm
Lead Channel Pacing Threshold Amplitude: 0.75 V
Lead Channel Pacing Threshold Pulse Width: 0.4 ms
Lead Channel Sensing Intrinsic Amplitude: 1.9 mV
Lead Channel Sensing Intrinsic Amplitude: 11.4 mV
Lead Channel Setting Pacing Amplitude: 2 V
Lead Channel Setting Pacing Amplitude: 3.5 V
Lead Channel Setting Pacing Pulse Width: 0.4 ms
Lead Channel Setting Sensing Sensitivity: 0.3 mV
Zone Setting Status: 755011
Zone Setting Status: 755011

## 2024-10-01 NOTE — Progress Notes (Signed)
 Remote ICD Transmission

## 2024-10-01 NOTE — Telephone Encounter (Signed)
 Spoke with patient to schedule a hospital f/u appointment.  Patient states he is seeing a Wound Care specialist on Mon 1/19.  If he and the provider feel he needs to see a dermatologist, he will call our office to schedule.

## 2024-10-04 ENCOUNTER — Encounter (HOSPITAL_BASED_OUTPATIENT_CLINIC_OR_DEPARTMENT_OTHER): Payer: MEDICAID | Admitting: Internal Medicine

## 2024-10-04 ENCOUNTER — Encounter: Payer: Self-pay | Admitting: Internal Medicine

## 2024-10-05 ENCOUNTER — Ambulatory Visit (HOSPITAL_COMMUNITY): Payer: MEDICAID

## 2024-10-05 ENCOUNTER — Encounter (HOSPITAL_COMMUNITY): Payer: Self-pay

## 2024-10-05 ENCOUNTER — Encounter (HOSPITAL_BASED_OUTPATIENT_CLINIC_OR_DEPARTMENT_OTHER): Payer: MEDICAID | Admitting: Internal Medicine

## 2024-10-14 ENCOUNTER — Ambulatory Visit: Payer: MEDICAID | Admitting: Student

## 2024-11-04 ENCOUNTER — Ambulatory Visit: Payer: MEDICAID | Admitting: Pulmonary Disease

## 2024-11-15 ENCOUNTER — Ambulatory Visit (HOSPITAL_COMMUNITY): Payer: MEDICAID | Admitting: Internal Medicine

## 2024-12-03 ENCOUNTER — Ambulatory Visit (HOSPITAL_COMMUNITY): Payer: MEDICAID | Admitting: Internal Medicine
